# Patient Record
Sex: Male | Born: 1950 | Race: Black or African American | Hispanic: No | Marital: Married | Smoking: Former smoker
Health system: Southern US, Community
[De-identification: ages and names within clinical notes are randomized; demographics above are authoritative.]

## PROBLEM LIST (undated history)

## (undated) DIAGNOSIS — R319 Hematuria, unspecified: Secondary | ICD-10-CM

## (undated) DIAGNOSIS — Z9889 Other specified postprocedural states: Secondary | ICD-10-CM

## (undated) DIAGNOSIS — Z8669 Personal history of other diseases of the nervous system and sense organs: Secondary | ICD-10-CM

## (undated) DIAGNOSIS — I8390 Asymptomatic varicose veins of unspecified lower extremity: Secondary | ICD-10-CM

## (undated) DIAGNOSIS — E119 Type 2 diabetes mellitus without complications: Secondary | ICD-10-CM

## (undated) DIAGNOSIS — C801 Malignant (primary) neoplasm, unspecified: Secondary | ICD-10-CM

## (undated) DIAGNOSIS — R413 Other amnesia: Secondary | ICD-10-CM

## (undated) DIAGNOSIS — F1027 Alcohol dependence with alcohol-induced persisting dementia: Secondary | ICD-10-CM

## (undated) DIAGNOSIS — R269 Unspecified abnormalities of gait and mobility: Secondary | ICD-10-CM

## (undated) DIAGNOSIS — R7301 Impaired fasting glucose: Secondary | ICD-10-CM

## (undated) DIAGNOSIS — E785 Hyperlipidemia, unspecified: Secondary | ICD-10-CM

## (undated) DIAGNOSIS — N289 Disorder of kidney and ureter, unspecified: Secondary | ICD-10-CM

## (undated) DIAGNOSIS — M47812 Spondylosis without myelopathy or radiculopathy, cervical region: Secondary | ICD-10-CM

## (undated) DIAGNOSIS — I10 Essential (primary) hypertension: Secondary | ICD-10-CM

## (undated) DIAGNOSIS — H409 Unspecified glaucoma: Secondary | ICD-10-CM

## (undated) HISTORY — DX: Asymptomatic varicose veins of unspecified lower extremity: I83.90

## (undated) HISTORY — DX: Unspecified abnormalities of gait and mobility: R26.9

## (undated) HISTORY — PX: EYE SURGERY: SHX253

## (undated) HISTORY — DX: Personal history of other diseases of the nervous system and sense organs: Z86.69

## (undated) HISTORY — DX: Hyperlipidemia, unspecified: E78.5

## (undated) HISTORY — DX: Essential (primary) hypertension: I10

## (undated) HISTORY — DX: Impaired fasting glucose: R73.01

## (undated) HISTORY — DX: Type 2 diabetes mellitus without complications: E11.9

## (undated) HISTORY — DX: Other amnesia: R41.3

## (undated) HISTORY — DX: Spondylosis without myelopathy or radiculopathy, cervical region: M47.812

## (undated) HISTORY — DX: Other specified postprocedural states: Z98.890

## (undated) HISTORY — PX: PROSTATE SURGERY: SHX751

## (undated) HISTORY — DX: Unspecified glaucoma: H40.9

## (undated) HISTORY — PX: CHOLECYSTECTOMY: SHX55

---

## 2001-12-14 ENCOUNTER — Ambulatory Visit: Admission: RE | Admit: 2001-12-14 | Discharge: 2002-03-14 | Payer: Self-pay | Admitting: Radiation Oncology

## 2001-12-29 ENCOUNTER — Ambulatory Visit (HOSPITAL_COMMUNITY): Admission: RE | Admit: 2001-12-29 | Discharge: 2001-12-29 | Payer: Self-pay | Admitting: Radiation Oncology

## 2001-12-29 ENCOUNTER — Encounter: Payer: Self-pay | Admitting: Radiation Oncology

## 2003-08-12 ENCOUNTER — Emergency Department (HOSPITAL_COMMUNITY): Admission: EM | Admit: 2003-08-12 | Discharge: 2003-08-12 | Payer: Self-pay | Admitting: Emergency Medicine

## 2005-04-14 ENCOUNTER — Ambulatory Visit (HOSPITAL_COMMUNITY): Admission: RE | Admit: 2005-04-14 | Discharge: 2005-04-14 | Payer: Self-pay | Admitting: Internal Medicine

## 2005-04-14 ENCOUNTER — Ambulatory Visit: Payer: Self-pay | Admitting: Internal Medicine

## 2006-12-02 ENCOUNTER — Encounter: Admission: RE | Admit: 2006-12-02 | Discharge: 2006-12-02 | Payer: Self-pay | Admitting: Internal Medicine

## 2007-05-29 ENCOUNTER — Ambulatory Visit (HOSPITAL_COMMUNITY): Admission: RE | Admit: 2007-05-29 | Discharge: 2007-05-29 | Payer: Self-pay | Admitting: Internal Medicine

## 2008-01-10 ENCOUNTER — Emergency Department (HOSPITAL_COMMUNITY): Admission: EM | Admit: 2008-01-10 | Discharge: 2008-01-10 | Payer: Self-pay | Admitting: Emergency Medicine

## 2008-06-21 ENCOUNTER — Emergency Department (HOSPITAL_COMMUNITY): Admission: EM | Admit: 2008-06-21 | Discharge: 2008-06-21 | Payer: Self-pay | Admitting: Emergency Medicine

## 2008-08-22 ENCOUNTER — Ambulatory Visit (HOSPITAL_COMMUNITY): Admission: RE | Admit: 2008-08-22 | Discharge: 2008-08-22 | Payer: Self-pay | Admitting: Ophthalmology

## 2008-09-05 ENCOUNTER — Ambulatory Visit (HOSPITAL_COMMUNITY): Admission: RE | Admit: 2008-09-05 | Discharge: 2008-09-05 | Payer: Self-pay | Admitting: Ophthalmology

## 2011-04-16 NOTE — Op Note (Signed)
Jeffery [name], Jeffery [name]                 ACCOUNT NO.:  [id number]   MEDICAL RECORD NO.:  [id number]          PATIENT TYPE:  AMB   LOCATION:  DAY                           FACILITY:  [facility]   PHYSICIAN:  R. Roetta Sessions, M.D. DATE OF BIRTH:  Mar 18, 1951   DATE OF PROCEDURE:  04/14/2005  DATE OF DISCHARGE:                                 OPERATIVE REPORT   PROCEDURE:  Screening colonoscopy.   INDICATIONS:  The patient is a 60 year old African American male sent over  at the courtesy of Dr. Ouida Sills for colorectal cancer screening.  He is devoid  of any lower GI tract symptoms.  He has never had colonoscopy and there is  no family history of colorectal neoplasia.  The colonoscopy is now being  done as a screening maneuver.  This approach has been discussed with the  patient along with the potential risks, benefits and alternatives.  His  questions answered and he is agreeable.   DESCRIPTION OF PROCEDURE:  Oxygen saturation, blood pressure, pulse and  respiration were monitored throughout the entire procedure.  Conscious  sedation with Versed 5 mg IV and Demerol 125 mg IV in divided doses.  The  instrument was the Olympus video chip system.   FINDINGS:  Digital rectal exam revealed no abnormalities.   ENDOSCOPIC FINDINGS:  Prep was good.   Rectum:  Examination of the rectal mucosa including retroflexed view of the  anal verge revealed no abnormalities.   Colon:  Colonic mucosa was surveyed from the rectosigmoid junction through  the left, transverse, right colon and the  area of the appendiceal orifice,  ileocecal valve and cecum.  These structures were well seen and photographed  for the record.  From the level the scope was slowly withdrawn.  All  previously mentioned mucosal surfaces were again seen.  The patient had just  a couple of very small and narrow-mouthed sigmoid diverticula.  The  remainder of the colonic mucosa appeared normal.   The patient tolerated the procedure well and was  reactive after endoscopy.   IMPRESSION:  1.  Normal rectum.  2.  Minimal sigmoid diverticula; otherwise normal colon.   RECOMMENDATIONS:  1.  Diverticulosis literature provided to [name].  2.  Repeat screening colonoscopy in 10 years.      RMR/MEDQ  D:  04/14/2005  T:  04/14/2005  Job:  [id number]   cc:   Kingsley Callander. Ouida Sills, MD  [address]  [place]  [place] [address]  Fax: [phone]

## 2011-08-30 LAB — POCT I-STAT 4, (NA,K, GLUC, HGB,HCT)
Hemoglobin: 16.7
Sodium: 136

## 2011-09-01 LAB — BASIC METABOLIC PANEL
BUN: 25 — ABNORMAL HIGH
Chloride: 100
Glucose, Bld: 120 — ABNORMAL HIGH
Potassium: 3.8

## 2011-09-01 LAB — HEMOGLOBIN AND HEMATOCRIT, BLOOD: Hemoglobin: 14.7

## 2012-12-07 DIAGNOSIS — E0590 Thyrotoxicosis, unspecified without thyrotoxic crisis or storm: Secondary | ICD-10-CM | POA: Insufficient documentation

## 2012-12-07 DIAGNOSIS — R5381 Other malaise: Secondary | ICD-10-CM | POA: Insufficient documentation

## 2012-12-07 DIAGNOSIS — R209 Unspecified disturbances of skin sensation: Secondary | ICD-10-CM | POA: Insufficient documentation

## 2012-12-07 DIAGNOSIS — M47812 Spondylosis without myelopathy or radiculopathy, cervical region: Secondary | ICD-10-CM | POA: Insufficient documentation

## 2012-12-07 DIAGNOSIS — R269 Unspecified abnormalities of gait and mobility: Secondary | ICD-10-CM | POA: Insufficient documentation

## 2012-12-07 DIAGNOSIS — G3281 Cerebellar ataxia in diseases classified elsewhere: Secondary | ICD-10-CM | POA: Insufficient documentation

## 2012-12-07 DIAGNOSIS — R55 Syncope and collapse: Secondary | ICD-10-CM | POA: Insufficient documentation

## 2013-04-06 ENCOUNTER — Encounter: Payer: Self-pay | Admitting: Neurology

## 2013-04-06 DIAGNOSIS — M47812 Spondylosis without myelopathy or radiculopathy, cervical region: Secondary | ICD-10-CM

## 2013-04-06 DIAGNOSIS — R209 Unspecified disturbances of skin sensation: Secondary | ICD-10-CM

## 2013-04-06 DIAGNOSIS — R5381 Other malaise: Secondary | ICD-10-CM

## 2013-04-06 DIAGNOSIS — G3281 Cerebellar ataxia in diseases classified elsewhere: Secondary | ICD-10-CM

## 2013-04-06 DIAGNOSIS — R55 Syncope and collapse: Secondary | ICD-10-CM

## 2013-04-06 DIAGNOSIS — E0590 Thyrotoxicosis, unspecified without thyrotoxic crisis or storm: Secondary | ICD-10-CM

## 2013-04-06 DIAGNOSIS — R269 Unspecified abnormalities of gait and mobility: Secondary | ICD-10-CM

## 2013-04-09 ENCOUNTER — Ambulatory Visit: Payer: Self-pay | Admitting: Neurology

## 2014-03-05 ENCOUNTER — Emergency Department (HOSPITAL_COMMUNITY)
Admission: EM | Admit: 2014-03-05 | Discharge: 2014-03-05 | Disposition: A | Discharge status: Home / Self Care | Payer: BC Managed Care – PPO | Attending: Emergency Medicine | Admitting: Emergency Medicine

## 2014-03-05 ENCOUNTER — Encounter (HOSPITAL_COMMUNITY): Payer: Self-pay | Admitting: Emergency Medicine

## 2014-03-05 DIAGNOSIS — Z8719 Personal history of other diseases of the digestive system: Secondary | ICD-10-CM | POA: Insufficient documentation

## 2014-03-05 DIAGNOSIS — R1110 Vomiting, unspecified: Secondary | ICD-10-CM | POA: Insufficient documentation

## 2014-03-05 DIAGNOSIS — Z880 Allergy status to penicillin: Secondary | ICD-10-CM | POA: Insufficient documentation

## 2014-03-05 DIAGNOSIS — J029 Acute pharyngitis, unspecified: Secondary | ICD-10-CM | POA: Insufficient documentation

## 2014-03-05 DIAGNOSIS — Z8679 Personal history of other diseases of the circulatory system: Secondary | ICD-10-CM | POA: Insufficient documentation

## 2014-03-05 DIAGNOSIS — Z79899 Other long term (current) drug therapy: Secondary | ICD-10-CM | POA: Insufficient documentation

## 2014-03-05 DIAGNOSIS — Z8669 Personal history of other diseases of the nervous system and sense organs: Secondary | ICD-10-CM | POA: Insufficient documentation

## 2014-03-05 DIAGNOSIS — J309 Allergic rhinitis, unspecified: Secondary | ICD-10-CM

## 2014-03-05 NOTE — ED Notes (Signed)
Patient with no complaints at this time. Respirations even and unlabored. Skin warm/dry. Discharge instructions reviewed with patient at this time. Patient given opportunity to voice concerns/ask questions. Patient discharged at this time and left Emergency Department with steady gait.   

## 2014-03-05 NOTE — Discharge Instructions (Signed)
Use zyrtec OTC daily (or claritin, zyrtec is less sedating). Use nasacort allergy 24 hr OTC for your nasal congestion and sneezing. Try Netti pot bottle to clean out your nasal passages. Recheck if you get a fever, pain in your face or you aren't improving on the medications in the next week.     Hay Fever Hay fever is an allergic reaction to particles in the air. It cannot be passed from person to person. It cannot be cured, but it can be controlled. CAUSES  Hay fever is caused by something that triggers an allergic reaction (allergens). The following are examples of allergens:  Ragweed.  Feathers.  Animal dander.  Grass and tree pollens.  Cigarette smoke.  House dust.  Pollution. SYMPTOMS   Sneezing.  Runny or stuffy nose.  Tearing eyes.  Itchy eyes, nose, mouth, throat, skin, or other area.  Sore throat.  Headache.  Decreased sense of smell or taste. DIAGNOSIS Your caregiver will perform a physical exam and ask questions about the symptoms you are having.Allergy testing may be done to determine exactly what triggers your hay fever.  TREATMENT   Over-the-counter medicines may help symptoms. These include:  Antihistamines.  Decongestants. These may help with nasal congestion.  Your caregiver may prescribe medicines if over-the-counter medicines do not work.  Some people benefit from allergy shots when other medicines are not helpful. HOME CARE INSTRUCTIONS   Avoid the allergen that is causing your symptoms, if possible.  Take all medicine as told by your caregiver. SEEK MEDICAL CARE IF:   You have severe allergy symptoms and your current medicines are not helping.  Your treatment was working at one time, but you are now experiencing symptoms.  You have sinus congestion and pressure.  You develop a fever or headache.  You have thick nasal discharge.  You have asthma and have a worsening cough and wheezing. SEEK IMMEDIATE MEDICAL CARE IF:   You  have swelling of your tongue or lips.  You have trouble breathing.  You feel lightheaded or like you are going to faint.  You have cold sweats.  You have a fever. Document Released: 11/15/2005 Document Revised: 02/07/2012 Document Reviewed: 02/10/2011 [facility] Patient Information 2014 [place].

## 2014-03-05 NOTE — ED Notes (Signed)
Reports worsening sinus congestion for past couple of days making pt vomit.  Pt reports eating a grape last night and grape got stuck in throat.  Took an hour to cough grape out.  Pt reports feels like throat is irritated and still feels like something is stuck in throat.

## 2014-03-05 NOTE — ED Provider Notes (Signed)
CSN: [id number]     Arrival date & time 03/05/14  6962 History  This chart was scribed for Janice Norrie, MD by Rolanda Lundborg, ED Scribe. This patient was seen in room APA12/APA12 and the patient's care was started at 7:08 AM.    Chief Complaint  Patient presents with  . Nasal Congestion     (Consider location/radiation/quality/duration/timing/severity/associated sxs/prior Treatment) The history is provided by the patient. No language interpreter was used.   HPI Comments: Jeffery [name] is a 63 y.o. male who presents to the Emergency Department complaining of gradually-worsening, constant sinus congestion onset a few weeks ago when the weather started changing. He reports constant postnasal drip that causes him to spit and occasionally vomit. He states last night a grape got stuck in his throat for an hour and he has been having a sore throat and feeling like something is stuck there since.He was able to cough it up.  He reports a cough with clear sputum that is now yellow sputum and states he saw streaks of blood mixed in this morning.  He reports sneezing. He denies any facial pain, fever. He reports rhinorrhea that has been clear but turned yellow today. He has been taking Alka Seltzer plus. He states the springtime is normally bad for his sinuses. He denies fever or chills. He does not smoke.  PCP - [name]   Past Medical History  Diagnosis Date  . Progressive gait disorder   . Hx of arthroscopic knee surgery     Left knee  . Glaucoma   . History of migraine headaches   . Cervical spondylosis    Past Surgical History  Procedure Laterality Date  . Elbow surgery Left   . Cholecystectomy    . Prostate surgery     Family History  Problem Relation Age of Onset  . Glaucoma Mother   . Arthritis Father   . Fibromyalgia Sister   . Gait disorder Maternal Uncle     progessive gait disorder   History  Substance Use Topics  . Smoking status: Never Smoker   . Smokeless tobacco: Not  on file  . Alcohol Use: Yes     Comment: daily  employed  Review of Systems  Constitutional: Negative for fever and chills.  HENT: Positive for congestion, postnasal drip, rhinorrhea, sinus pressure, sneezing and sore throat.   Respiratory: Positive for cough.   Gastrointestinal: Positive for vomiting.  All other systems reviewed and are negative.      Allergies  Penicillins  Home Medications   Current Outpatient Rx  Name  Route  Sig  Dispense  Refill  . amLODipine-olmesartan (AZOR) 5-40 MG per tablet   Oral   Take 1 tablet by mouth daily.         . baclofen (LIORESAL) 10 MG tablet   Oral   Take 10 mg by mouth daily. One half tablet am, 1 tablet at bedtime         . Cholecalciferol (VITAMIN D) 1000 UNITS capsule   Oral   Take 1,000 Units by mouth daily.          BP 125/86  Pulse 90  Temp(Src) 98 F (36.7 C) (Oral)  Resp 16  Ht 6\' 2"  (1.88 m)  Wt 255 lb (115.667 kg)  BMI 32.73 kg/m2  SpO2 94%  Vital signs normal   Physical Exam  Nursing note and vitals reviewed. Constitutional: He is oriented to person, place, and time. He appears well-developed and well-nourished.  Non-toxic appearance. He does not appear ill. No distress.  HENT:  Head: Normocephalic and atraumatic.  Right Ear: Tympanic membrane, external ear and ear canal normal.  Left Ear: Tympanic membrane, external ear and ear canal normal.  Nose: Nose normal. No mucosal edema or rhinorrhea.  Mouth/Throat: Mucous membranes are normal. No dental abscesses or uvula swelling. Posterior oropharyngeal erythema (mild diffuse) present.  Bogginess of turbinates bilaterally, mild diffuse redness of the oral pharynx. No soft tissue swelling. Voice normal.   Eyes: Conjunctivae and EOM are normal. Pupils are equal, round, and reactive to light.  Neck: Normal range of motion and full passive range of motion without pain. Neck supple.  Cardiovascular: Normal rate, regular rhythm and normal heart sounds.  Exam  reveals no gallop and no friction rub.   No murmur heard. Pulmonary/Chest: Effort normal and breath sounds normal. No respiratory distress. He has no wheezes. He has no rhonchi. He has no rales. He exhibits no tenderness and no crepitus.  Abdominal: Soft. Normal appearance and bowel sounds are normal. He exhibits no distension. There is no tenderness. There is no rebound and no guarding.  Musculoskeletal: Normal range of motion. He exhibits no edema and no tenderness.  Moves all extremities well.   Neurological: He is alert and oriented to person, place, and time. He has normal strength. No cranial nerve deficit.  Skin: Skin is warm, dry and intact. No rash noted. No erythema. No pallor.  Psychiatric: He has a normal mood and affect. His speech is normal and behavior is normal. His mood appears not anxious.    ED Course  Procedures (including critical care time) Medications - No data to display  DIAGNOSTIC STUDIES: Oxygen Saturation is 94% on RA, adequate by my interpretation.    COORDINATION OF CARE: 7:51 AM- Discussed treatment plan with pt which includes discharge home. Pt agrees to plan.    Labs Review Labs Reviewed - No data to display Imaging Review No results found.   EKG Interpretation None      MDM  patient presents with allergy type symptoms with rhinorrhea, sneezing, cough. He has no concerning symptoms that would require antibiotics such as high fever. He will be managed with over-the-counter allergy type medications.    Final diagnoses:  Allergic rhinitis    OTC meds  Zyrtec/claritin and nasacort  Plan discharge  Rolland Porter, MD, FACEP    I personally performed the services described in this documentation, which was scribed in my presence. The recorded information has been reviewed and considered.  Rolland Porter, MD, Abram Sander   Janice Norrie, MD 03/05/14 [phone]

## 2014-04-07 ENCOUNTER — Emergency Department (HOSPITAL_COMMUNITY): Payer: BC Managed Care – PPO

## 2014-04-07 ENCOUNTER — Encounter (HOSPITAL_COMMUNITY): Payer: Self-pay | Admitting: Emergency Medicine

## 2014-04-07 ENCOUNTER — Emergency Department (HOSPITAL_COMMUNITY)
Admission: EM | Admit: 2014-04-07 | Discharge: 2014-04-07 | Disposition: A | Discharge status: Home / Self Care | Payer: BC Managed Care – PPO | Attending: Emergency Medicine | Admitting: Emergency Medicine

## 2014-04-07 DIAGNOSIS — R0982 Postnasal drip: Secondary | ICD-10-CM | POA: Insufficient documentation

## 2014-04-07 DIAGNOSIS — Z8669 Personal history of other diseases of the nervous system and sense organs: Secondary | ICD-10-CM | POA: Insufficient documentation

## 2014-04-07 DIAGNOSIS — K859 Acute pancreatitis without necrosis or infection, unspecified: Secondary | ICD-10-CM

## 2014-04-07 DIAGNOSIS — Z9089 Acquired absence of other organs: Secondary | ICD-10-CM | POA: Insufficient documentation

## 2014-04-07 DIAGNOSIS — Z880 Allergy status to penicillin: Secondary | ICD-10-CM | POA: Insufficient documentation

## 2014-04-07 DIAGNOSIS — Z8679 Personal history of other diseases of the circulatory system: Secondary | ICD-10-CM | POA: Insufficient documentation

## 2014-04-07 DIAGNOSIS — K869 Disease of pancreas, unspecified: Secondary | ICD-10-CM | POA: Insufficient documentation

## 2014-04-07 DIAGNOSIS — Z9889 Other specified postprocedural states: Secondary | ICD-10-CM | POA: Insufficient documentation

## 2014-04-07 DIAGNOSIS — M47812 Spondylosis without myelopathy or radiculopathy, cervical region: Secondary | ICD-10-CM | POA: Insufficient documentation

## 2014-04-07 DIAGNOSIS — Z79899 Other long term (current) drug therapy: Secondary | ICD-10-CM | POA: Insufficient documentation

## 2014-04-07 DIAGNOSIS — R6883 Chills (without fever): Secondary | ICD-10-CM | POA: Insufficient documentation

## 2014-04-07 DIAGNOSIS — R269 Unspecified abnormalities of gait and mobility: Secondary | ICD-10-CM | POA: Insufficient documentation

## 2014-04-07 LAB — CBC WITH DIFFERENTIAL/PLATELET
BASOS ABS: 0 10*3/uL (ref 0.0–0.1)
BASOS PCT: 0 % (ref 0–1)
EOS PCT: 0 % (ref 0–5)
Eosinophils Absolute: 0 10*3/uL (ref 0.0–0.7)
HEMATOCRIT: 44.1 % (ref 39.0–52.0)
Hemoglobin: 16.1 g/dL (ref 13.0–17.0)
LYMPHS PCT: 8 % — AB (ref 12–46)
Lymphs Abs: 0.7 10*3/uL (ref 0.7–4.0)
MCH: 37.1 pg — ABNORMAL HIGH (ref 26.0–34.0)
MCHC: 36.5 g/dL — AB (ref 30.0–36.0)
MCV: 101.6 fL — AB (ref 78.0–100.0)
MONO ABS: 0.8 10*3/uL (ref 0.1–1.0)
Monocytes Relative: 8 % (ref 3–12)
NEUTROS ABS: 7.7 10*3/uL (ref 1.7–7.7)
Neutrophils Relative %: 84 % — ABNORMAL HIGH (ref 43–77)
PLATELETS: 188 10*3/uL (ref 150–400)
RBC: 4.34 MIL/uL (ref 4.22–5.81)
RDW: 12.9 % (ref 11.5–15.5)
WBC: 9.1 10*3/uL (ref 4.0–10.5)

## 2014-04-07 LAB — COMPREHENSIVE METABOLIC PANEL
ALBUMIN: 3.8 g/dL (ref 3.5–5.2)
ALT: 109 U/L — AB (ref 0–53)
AST: 324 U/L — AB (ref 0–37)
Alkaline Phosphatase: 185 U/L — ABNORMAL HIGH (ref 39–117)
BUN: 22 mg/dL (ref 6–23)
CALCIUM: 9.2 mg/dL (ref 8.4–10.5)
CO2: 28 mEq/L (ref 19–32)
CREATININE: 1.54 mg/dL — AB (ref 0.50–1.35)
Chloride: 86 mEq/L — ABNORMAL LOW (ref 96–112)
GFR calc Af Amer: 54 mL/min — ABNORMAL LOW (ref >90)
GFR calc non Af Amer: 46 mL/min — ABNORMAL LOW (ref >90)
Glucose, Bld: 234 mg/dL — ABNORMAL HIGH (ref 70–99)
Potassium: 3 mEq/L — ABNORMAL LOW (ref 3.7–5.3)
SODIUM: 131 meq/L — AB (ref 137–147)
TOTAL PROTEIN: 7.8 g/dL (ref 6.0–8.3)
Total Bilirubin: 1.7 mg/dL — ABNORMAL HIGH (ref 0.3–1.2)

## 2014-04-07 LAB — URINALYSIS, ROUTINE W REFLEX MICROSCOPIC
GLUCOSE, UA: NEGATIVE mg/dL
Ketones, ur: NEGATIVE mg/dL
Leukocytes, UA: NEGATIVE
Nitrite: NEGATIVE
PH: 5.5 (ref 5.0–8.0)
UROBILINOGEN UA: 0.2 mg/dL (ref 0.0–1.0)

## 2014-04-07 LAB — URINE MICROSCOPIC-ADD ON

## 2014-04-07 LAB — LIPASE, BLOOD: LIPASE: 458 U/L — AB (ref 11–59)

## 2014-04-07 MED ORDER — PROMETHAZINE HCL 25 MG PO TABS: 25.0000 mg | ORAL_TABLET | Freq: Four times a day (QID) | ORAL | Status: DC | PRN

## 2014-04-07 MED ORDER — PANTOPRAZOLE SODIUM 40 MG IV SOLR
40.0000 mg | Freq: Once | INTRAVENOUS | Status: AC
  Administered 2014-04-07: 40 mg via INTRAVENOUS
  Filled 2014-04-07: qty 40

## 2014-04-07 MED ORDER — ONDANSETRON HCL 4 MG/2ML IJ SOLN
4.0000 mg | Freq: Once | INTRAMUSCULAR | Status: AC
  Administered 2014-04-07: 4 mg via INTRAVENOUS
  Filled 2014-04-07: qty 2

## 2014-04-07 MED ORDER — HYDROMORPHONE HCL PF 1 MG/ML IJ SOLN
0.5000 mg | Freq: Once | INTRAMUSCULAR | Status: AC
  Administered 2014-04-07: 0.5 mg via INTRAVENOUS
  Filled 2014-04-07: qty 1

## 2014-04-07 MED ORDER — SODIUM CHLORIDE 0.9 % IV BOLUS (SEPSIS)
1000.0000 mL | Freq: Once | INTRAVENOUS | Status: AC
  Administered 2014-04-07: 1000 mL via INTRAVENOUS

## 2014-04-07 MED ORDER — POTASSIUM CHLORIDE 10 MEQ/100ML IV SOLN: 10.0000 meq | Freq: Once | INTRAVENOUS | Status: DC

## 2014-04-07 MED ORDER — HYDROCODONE-ACETAMINOPHEN 5-325 MG PO TABS: 1.0000 | ORAL_TABLET | Freq: Four times a day (QID) | ORAL | Status: DC | PRN

## 2014-04-07 MED ORDER — POTASSIUM CHLORIDE 10 MEQ/100ML IV SOLN
10.0000 meq | Freq: Once | INTRAVENOUS | Status: AC
  Administered 2014-04-07: 10 meq via INTRAVENOUS
  Filled 2014-04-07: qty 100

## 2014-04-07 NOTE — Discharge Instructions (Signed)
Stop drinking alcohol and follow up with your family md this week.

## 2014-04-07 NOTE — ED Notes (Signed)
Pt resting.  No complaints at this time.  IV infusing without difficulty.  No distress or needs verbalized.

## 2014-04-07 NOTE — ED Notes (Signed)
Pt c/o generalized abd pain with n/v that was seen in er yesterday,

## 2014-04-07 NOTE — ED Provider Notes (Signed)
CSN: [id number]     Arrival date & time 04/07/14  1437 History  This chart was scribed for Maudry Diego, MD by Zettie Pho, ED Scribe. This patient was seen in room APA01/APA01 and the patient's care was started at 3:19 PM.    Chief Complaint  Patient presents with  . Emesis    Patient is a 63 y.o. male presenting with vomiting. The history is provided by the patient. No language interpreter was used.  Emesis Severity:  Mild Timing:  Intermittent Quality:  Stomach contents Progression:  Unchanged Chronicity:  New Relieved by:  Nothing Worsened by:  Nothing tried Ineffective treatments:  None tried Associated symptoms: abdominal pain and chills   Associated symptoms: no diarrhea, no fever and no headaches   Abdominal pain:    Location:  Generalized   Severity:  Moderate   Onset quality:  Gradual   Timing:  Intermittent   Progression:  Unchanged   Chronicity:  New Risk factors: prior abdominal surgery   Risk factors: no diabetes    HPI Comments: Jeffery [name] is a 63 y.o. male who presents to the Emergency Department complaining of a generalized pain to the abdomen with associated nausea, a few episodes of emesis, and chills onset a few days ago. He reports some loose stools, which he states is normal for him and denies any recent changes. Patient reports some associated congestion and postnasal drip for the past few weeks. He denies fever. Patient has a surgical history of cholecystectomy. Patient has an allergy to penicillins. Patient has no other pertinent medical history.   Past Medical History  Diagnosis Date  . Progressive gait disorder   . Hx of arthroscopic knee surgery     Left knee  . Glaucoma   . History of migraine headaches   . Cervical spondylosis    Past Surgical History  Procedure Laterality Date  . Cholecystectomy    . Prostate surgery    . Eye surgery     Family History  Problem Relation Age of Onset  . Glaucoma Mother   . Arthritis Father   .  Fibromyalgia Sister   . Gait disorder Maternal Uncle     progessive gait disorder   History  Substance Use Topics  . Smoking status: Never Smoker   . Smokeless tobacco: Not on file  . Alcohol Use: Yes     Comment: daily    Review of Systems  Constitutional: Positive for chills. Negative for appetite change and fatigue.  HENT: Positive for congestion and postnasal drip. Negative for ear discharge and sinus pressure.   Eyes: Negative for discharge.  Respiratory: Negative for cough.   Cardiovascular: Negative for chest pain.  Gastrointestinal: Positive for nausea, vomiting and abdominal pain. Negative for diarrhea.  Genitourinary: Negative for frequency and hematuria.  Musculoskeletal: Negative for back pain.  Skin: Negative for rash.  Neurological: Negative for seizures and headaches.  Psychiatric/Behavioral: Negative for hallucinations.      Allergies  Penicillins  Home Medications   Prior to Admission medications   Medication Sig Start Date End Date Taking? Authorizing Provider  amLODipine-benazepril (LOTREL) 10-20 MG per capsule Take 1 capsule by mouth daily.   Yes Historical Provider, MD  chlorthalidone (HYGROTON) 25 MG tablet Take 25 mg by mouth daily. To keep blood pressure less than 140/90   Yes Historical Provider, MD  simvastatin (ZOCOR) 20 MG tablet Take 20 mg by mouth at bedtime.   Yes Historical Provider, MD   Triage Vitals: BP  127/92  Pulse 108  Temp(Src) 98.3 F (36.8 C) (Oral)  Resp 20  SpO2 99%  Physical Exam  Constitutional: He is oriented to person, place, and time. He appears well-developed.  HENT:  Head: Normocephalic.  Eyes: Conjunctivae and EOM are normal. No scleral icterus.  Neck: Neck supple. No thyromegaly present.  Cardiovascular: Normal rate and regular rhythm.  Exam reveals no gallop and no friction rub.   No murmur heard. Pulmonary/Chest: No stridor. He has no wheezes. He has no rales. He exhibits no tenderness.  Abdominal: He  exhibits no distension. There is tenderness. There is no rebound.  Minor epigastric tenderness.   Musculoskeletal: Normal range of motion. He exhibits no edema.  Lymphadenopathy:    He has no cervical adenopathy.  Neurological: He is oriented to person, place, and time. He exhibits normal muscle tone. Coordination normal.  Skin: No rash noted. No erythema.  Psychiatric: He has a normal mood and affect. His behavior is normal.    ED Course  Procedures (including critical care time)  DIAGNOSTIC STUDIES: Oxygen Saturation is 99% on room air, normal by my interpretation.    COORDINATION OF CARE: 3:22 PM- Will order an x-ray of the abdomen, CMP, UA, and CBC. Ordered IV fluids, Zofran, Protonix, and Dilaudid to manage symptoms. Discussed treatment plan with patient at bedside and patient verbalized agreement.     Labs Review Labs Reviewed - No data to display  Imaging Review No results found.   EKG Interpretation None      MDM   Final diagnoses:  None    The chart was scribed for me under my direct supervision.  I personally performed the history, physical, and medical decision making and all procedures in the evaluation of this patient.Maudry Diego, MD 04/07/14 [phone]

## 2014-04-18 ENCOUNTER — Other Ambulatory Visit (HOSPITAL_COMMUNITY): Payer: Self-pay | Admitting: Internal Medicine

## 2014-04-18 DIAGNOSIS — R1011 Right upper quadrant pain: Secondary | ICD-10-CM

## 2014-04-23 ENCOUNTER — Ambulatory Visit (HOSPITAL_COMMUNITY)
Admission: RE | Admit: 2014-04-23 | Discharge: 2014-04-23 | Disposition: A | Discharge status: Home / Self Care | Payer: BC Managed Care – PPO | Source: Ambulatory Visit | Attending: Internal Medicine | Admitting: Internal Medicine

## 2014-04-23 DIAGNOSIS — R1011 Right upper quadrant pain: Secondary | ICD-10-CM | POA: Insufficient documentation

## 2014-04-23 DIAGNOSIS — K7689 Other specified diseases of liver: Secondary | ICD-10-CM | POA: Insufficient documentation

## 2014-10-21 ENCOUNTER — Emergency Department (HOSPITAL_COMMUNITY)
Admission: EM | Admit: 2014-10-21 | Discharge: 2014-10-22 | Disposition: A | Discharge status: Home / Self Care | Payer: BC Managed Care – PPO | Attending: Emergency Medicine | Admitting: Emergency Medicine

## 2014-10-21 ENCOUNTER — Encounter (HOSPITAL_COMMUNITY): Payer: Self-pay

## 2014-10-21 ENCOUNTER — Emergency Department (HOSPITAL_COMMUNITY): Payer: BC Managed Care – PPO

## 2014-10-21 DIAGNOSIS — Z8669 Personal history of other diseases of the nervous system and sense organs: Secondary | ICD-10-CM | POA: Diagnosis not present

## 2014-10-21 DIAGNOSIS — Z8739 Personal history of other diseases of the musculoskeletal system and connective tissue: Secondary | ICD-10-CM | POA: Diagnosis not present

## 2014-10-21 DIAGNOSIS — Z79899 Other long term (current) drug therapy: Secondary | ICD-10-CM | POA: Diagnosis not present

## 2014-10-21 DIAGNOSIS — R319 Hematuria, unspecified: Secondary | ICD-10-CM | POA: Diagnosis present

## 2014-10-21 DIAGNOSIS — N3001 Acute cystitis with hematuria: Secondary | ICD-10-CM

## 2014-10-21 DIAGNOSIS — Z8546 Personal history of malignant neoplasm of prostate: Secondary | ICD-10-CM | POA: Insufficient documentation

## 2014-10-21 DIAGNOSIS — Z9889 Other specified postprocedural states: Secondary | ICD-10-CM | POA: Diagnosis not present

## 2014-10-21 DIAGNOSIS — Z8679 Personal history of other diseases of the circulatory system: Secondary | ICD-10-CM | POA: Diagnosis not present

## 2014-10-21 DIAGNOSIS — Z880 Allergy status to penicillin: Secondary | ICD-10-CM | POA: Insufficient documentation

## 2014-10-21 DIAGNOSIS — Z9089 Acquired absence of other organs: Secondary | ICD-10-CM | POA: Diagnosis not present

## 2014-10-21 HISTORY — DX: Malignant (primary) neoplasm, unspecified: C80.1

## 2014-10-21 LAB — CBC WITH DIFFERENTIAL/PLATELET
BASOS PCT: 0 % (ref 0–1)
Basophils Absolute: 0 10*3/uL (ref 0.0–0.1)
EOS ABS: 0.1 10*3/uL (ref 0.0–0.7)
Eosinophils Relative: 1 % (ref 0–5)
HCT: 40.5 % (ref 39.0–52.0)
HEMOGLOBIN: 13.7 g/dL (ref 13.0–17.0)
Lymphocytes Relative: 29 % (ref 12–46)
Lymphs Abs: 1.6 10*3/uL (ref 0.7–4.0)
MCH: 31.4 pg (ref 26.0–34.0)
MCHC: 33.8 g/dL (ref 30.0–36.0)
MCV: 92.9 fL (ref 78.0–100.0)
MONO ABS: 0.6 10*3/uL (ref 0.1–1.0)
MONOS PCT: 11 % (ref 3–12)
NEUTROS PCT: 59 % (ref 43–77)
Neutro Abs: 3.3 10*3/uL (ref 1.7–7.7)
Platelets: 180 10*3/uL (ref 150–400)
RBC: 4.36 MIL/uL (ref 4.22–5.81)
RDW: 13.3 % (ref 11.5–15.5)
WBC: 5.6 10*3/uL (ref 4.0–10.5)

## 2014-10-21 LAB — URINE MICROSCOPIC-ADD ON

## 2014-10-21 LAB — URINALYSIS, ROUTINE W REFLEX MICROSCOPIC
Glucose, UA: NEGATIVE mg/dL
KETONES UR: 15 mg/dL — AB
NITRITE: POSITIVE — AB
PH: 6.5 (ref 5.0–8.0)
Specific Gravity, Urine: 1.025 (ref 1.005–1.030)
Urobilinogen, UA: 2 mg/dL — ABNORMAL HIGH (ref 0.0–1.0)

## 2014-10-21 LAB — BASIC METABOLIC PANEL
Anion gap: 13 (ref 5–15)
BUN: 20 mg/dL (ref 6–23)
CO2: 27 mEq/L (ref 19–32)
CREATININE: 1.4 mg/dL — AB (ref 0.50–1.35)
Calcium: 9.9 mg/dL (ref 8.4–10.5)
Chloride: 99 mEq/L (ref 96–112)
GFR, EST AFRICAN AMERICAN: 60 mL/min — AB (ref >90)
GFR, EST NON AFRICAN AMERICAN: 52 mL/min — AB (ref >90)
Glucose, Bld: 101 mg/dL — ABNORMAL HIGH (ref 70–99)
POTASSIUM: 3.8 meq/L (ref 3.7–5.3)
Sodium: 139 mEq/L (ref 137–147)

## 2014-10-21 NOTE — ED Provider Notes (Signed)
CSN: [id number]     Arrival date & time 10/21/14  1706 History  This chart was scribe for Veryl Speak, MD by Judithann Sauger, ED Scribe. The patient was seen in room APA18/APA18 and the patient's care was started at 11:01 PM.    Chief Complaint  Patient presents with  . Hematuria   Patient is a 63 y.o. male presenting with hematuria. The history is provided by the patient. No language interpreter was used.  Hematuria This is a new problem. The current episode started 6 to 12 hours ago. The problem has not changed since onset.Pertinent negatives include no chest pain and no abdominal pain. Nothing aggravates the symptoms. Nothing relieves the symptoms. He has tried nothing for the symptoms.   HPI Comments: Jeffery [name] is a 63 y.o. male who presents to the Emergency Department complaining of hematuria onset 6 hours ago. He explains his hematuria as a dark red urine with blood clots but since arriving at the ER, there is also mucous. He denies pain or discomfort while urinating. He denies back pain and abdominal pain. He also denies any previous kidney stones. He reports that he had prostate cancer 13 years ago and had surgery at [facility]. His Urologist is located in [facility] and he reports that he has an appointment during the first week of January. He reports having a cholecystectomy in the past.   Past Medical History  Diagnosis Date  . Progressive gait disorder   . Hx of arthroscopic knee surgery     Left knee  . Glaucoma   . History of migraine headaches   . Cervical spondylosis   . Cancer     prostate   Past Surgical History  Procedure Laterality Date  . Cholecystectomy    . Prostate surgery    . Eye surgery     Family History  Problem Relation Age of Onset  . Glaucoma Mother   . Arthritis Father   . Fibromyalgia Sister   . Gait disorder Maternal Uncle     progessive gait disorder   History  Substance Use Topics  . Smoking status: Never Smoker   . Smokeless tobacco:  Not on file  . Alcohol Use: Yes     Comment: daily    Review of Systems  Constitutional: Negative for fever and chills.  Cardiovascular: Negative for chest pain.  Gastrointestinal: Negative for abdominal pain.  Genitourinary: Positive for hematuria. Negative for dysuria.  Musculoskeletal: Negative for back pain.      Allergies  Penicillins  Home Medications   Prior to Admission medications   Medication Sig Start Date End Date Taking? Authorizing Provider  amLODipine-benazepril (LOTREL) 10-20 MG per capsule Take 1 capsule by mouth daily.   Yes Historical Provider, MD  chlorthalidone (HYGROTON) 25 MG tablet Take 25 mg by mouth daily. To keep blood pressure less than 140/90   Yes Historical Provider, MD  glimepiride (AMARYL) 2 MG tablet Take 2 mg by mouth daily with breakfast.   Yes Historical Provider, MD  indomethacin (INDOCIN) 50 MG capsule Take 50 mg by mouth daily as needed for mild pain (associated with gout flares).   Yes Historical Provider, MD  metFORMIN (GLUCOPHAGE) 1000 MG tablet Take 1,000 mg by mouth 2 (two) times daily.   Yes Historical Provider, MD  simvastatin (ZOCOR) 20 MG tablet Take 20 mg by mouth at bedtime.   Yes Historical Provider, MD  HYDROcodone-acetaminophen (NORCO/VICODIN) 5-325 MG per tablet Take 1 tablet by mouth every 6 (six) hours  as needed for moderate pain. Patient not taking: Reported on 10/21/2014 04/07/14   Maudry Diego, MD  promethazine (PHENERGAN) 25 MG tablet Take 1 tablet (25 mg total) by mouth every 6 (six) hours as needed for nausea or vomiting. Patient not taking: Reported on 10/21/2014 04/07/14   Maudry Diego, MD   BP 127/74 mmHg  Pulse 71  Temp(Src) 98 F (36.7 C)  Resp 16  Wt 236 lb 11.2 oz (107.366 kg)  SpO2 100% Physical Exam  Constitutional: He is oriented to person, place, and time. He appears well-developed and well-nourished. No distress.  HENT:  Head: Normocephalic and atraumatic.  Eyes: Conjunctivae and EOM are normal.   Neck: Neck supple. No tracheal deviation present.  Cardiovascular: Normal rate, regular rhythm and normal heart sounds.   No murmur heard. Pulmonary/Chest: Effort normal and breath sounds normal. No respiratory distress.  Abdominal: Soft. Bowel sounds are normal. He exhibits no distension. There is no tenderness.  Musculoskeletal: Normal range of motion.  Neurological: He is alert and oriented to person, place, and time.  Skin: Skin is warm and dry.  Psychiatric: He has a normal mood and affect. His behavior is normal.  Nursing note and vitals reviewed.   ED Course  Procedures (including critical care time) DIAGNOSTIC STUDIES: Oxygen Saturation is 100% on RA, normal by my interpretation.    COORDINATION OF CARE: 11:08 PM- Pt advised of plan for treatment and pt agrees.    Labs Review Labs Reviewed  URINALYSIS, ROUTINE W REFLEX MICROSCOPIC - Abnormal; Notable for the following:    Color, Urine RED (*)    APPearance HAZY (*)    Hgb urine dipstick LARGE (*)    Bilirubin Urine SMALL (*)    Ketones, ur 15 (*)    Protein, ur >300 (*)    Urobilinogen, UA 2.0 (*)    Nitrite POSITIVE (*)    Leukocytes, UA SMALL (*)    All other components within normal limits  URINE MICROSCOPIC-ADD ON - Abnormal; Notable for the following:    Bacteria, UA MANY (*)    All other components within normal limits  CBC WITH DIFFERENTIAL  BASIC METABOLIC PANEL    Imaging Review No results found.   EKG Interpretation None      MDM   Final diagnoses:  None    Patient is a 63 year old male with history of prostatectomy. He presents with complaints of small blood clots in his urine. He denies any fevers or chills. Denies any dysuria. Workup reveals findings suggestive of a urinary tract infection on the urinalysis. CT scan reveals blood clot present within the urinary bladder but no evidence for kidney stones. There is thickening of the bladder wall that is consistent with his prior radiation  treatment. I feel as though the patient is appropriate for discharge, but will recommend he see his urologist in follow-up.  I personally performed the services described in this documentation, which was scribed in my presence. The recorded information has been reviewed and is accurate.    Veryl Speak, MD 10/22/14 [phone]

## 2014-10-21 NOTE — ED Notes (Signed)
Pt c/o gross hematuria for 1 hour.

## 2014-10-21 NOTE — ED Notes (Signed)
EDP at bedside  

## 2014-10-21 NOTE — ED Notes (Signed)
Patient states that when he is urinating now it looks like long strips of mucus coming out.

## 2014-10-22 MED ORDER — CIPROFLOXACIN HCL 250 MG PO TABS
500.0000 mg | ORAL_TABLET | Freq: Once | ORAL | Status: AC
  Administered 2014-10-22: 500 mg via ORAL
  Filled 2014-10-22: qty 2

## 2014-10-22 MED ORDER — CIPROFLOXACIN HCL 500 MG PO TABS
500.0000 mg | ORAL_TABLET | Freq: Two times a day (BID) | ORAL | Status: DC
Start: 2014-10-22 — End: 2016-05-07

## 2014-10-22 NOTE — Discharge Instructions (Signed)
Cipro as prescribed.  Follow up with [facility] to discuss the findings on today's CT scan.  The contact information has been provided on this discharge summary.  Call to arrange this appointment.   Urinary Tract Infection Urinary tract infections (UTIs) can develop anywhere along your urinary tract. Your urinary tract is your body's drainage system for removing wastes and extra water. Your urinary tract includes two kidneys, two ureters, a bladder, and a urethra. Your kidneys are a pair of bean-shaped organs. Each kidney is about the size of your fist. They are located below your ribs, one on each side of your spine. CAUSES Infections are caused by microbes, which are microscopic organisms, including fungi, viruses, and bacteria. These organisms are so small that they can only be seen through a microscope. Bacteria are the microbes that most commonly cause UTIs. SYMPTOMS  Symptoms of UTIs may vary by age and gender of the patient and by the location of the infection. Symptoms in young women typically include a frequent and intense urge to urinate and a painful, burning feeling in the bladder or urethra during urination. Older women and men are more likely to be tired, shaky, and weak and have muscle aches and abdominal pain. A fever may mean the infection is in your kidneys. Other symptoms of a kidney infection include pain in your back or sides below the ribs, nausea, and vomiting. DIAGNOSIS To diagnose a UTI, your caregiver will ask you about your symptoms. Your caregiver also will ask to provide a urine sample. The urine sample will be tested for bacteria and white blood cells. White blood cells are made by your body to help fight infection. TREATMENT  Typically, UTIs can be treated with medication. Because most UTIs are caused by a bacterial infection, they usually can be treated with the use of antibiotics. The choice of antibiotic and length of treatment depend on your symptoms and the  type of bacteria causing your infection. HOME CARE INSTRUCTIONS  If you were prescribed antibiotics, take them exactly as your caregiver instructs you. Finish the medication even if you feel better after you have only taken some of the medication.  Drink enough water and fluids to keep your urine clear or pale yellow.  Avoid caffeine, tea, and carbonated beverages. They tend to irritate your bladder.  Empty your bladder often. Avoid holding urine for long periods of time.  Empty your bladder before and after sexual intercourse.  After a bowel movement, women should cleanse from front to back. Use each tissue only once. SEEK MEDICAL CARE IF:   You have back pain.  You develop a fever.  Your symptoms do not begin to resolve within 3 days. SEEK IMMEDIATE MEDICAL CARE IF:   You have severe back pain or lower abdominal pain.  You develop chills.  You have nausea or vomiting.  You have continued burning or discomfort with urination. MAKE SURE YOU:   Understand these instructions.  Will watch your condition.  Will get help right away if you are not doing well or get worse. Document Released: 08/25/2005 Document Revised: 05/16/2012 Document Reviewed: 12/24/2011 [facility] Patient Information 2015 [place], [place]. This information is not intended to replace advice given to you by your health care provider. Make sure you discuss any questions you have with your health care provider.

## 2014-11-01 DIAGNOSIS — C61 Malignant neoplasm of prostate: Secondary | ICD-10-CM | POA: Insufficient documentation

## 2015-03-27 ENCOUNTER — Telehealth: Payer: Self-pay | Admitting: Internal Medicine

## 2015-03-27 NOTE — Telephone Encounter (Signed)
TCS RECALL  °

## 2015-03-27 NOTE — Telephone Encounter (Signed)
Letter mailed to pt.  

## 2015-11-04 ENCOUNTER — Encounter: Payer: Self-pay | Admitting: Surgery

## 2015-11-04 ENCOUNTER — Other Ambulatory Visit: Payer: Self-pay | Admitting: *Deleted

## 2015-11-04 DIAGNOSIS — I8311 Varicose veins of right lower extremity with inflammation: Secondary | ICD-10-CM

## 2015-12-19 ENCOUNTER — Encounter: Payer: Self-pay | Admitting: Surgery

## 2015-12-25 ENCOUNTER — Other Ambulatory Visit (HOSPITAL_COMMUNITY): Payer: Self-pay | Admitting: Internal Medicine

## 2015-12-25 DIAGNOSIS — R7989 Other specified abnormal findings of blood chemistry: Secondary | ICD-10-CM

## 2015-12-29 ENCOUNTER — Ambulatory Visit (HOSPITAL_COMMUNITY)
Admission: RE | Admit: 2015-12-29 | Discharge: 2015-12-29 | Disposition: A | Discharge status: Home / Self Care | Payer: Medicare Other | Source: Ambulatory Visit | Attending: Surgery | Admitting: Surgery

## 2015-12-29 ENCOUNTER — Encounter: Payer: Self-pay | Admitting: Surgery

## 2015-12-29 ENCOUNTER — Ambulatory Visit (INDEPENDENT_AMBULATORY_CARE_PROVIDER_SITE_OTHER): Payer: Medicare Other | Admitting: Surgery

## 2015-12-29 VITALS — BP 124/79 | HR 73 | Temp 98.1°F | Resp 16 | Ht 74.0 in | Wt 252.0 lb

## 2015-12-29 DIAGNOSIS — I10 Essential (primary) hypertension: Secondary | ICD-10-CM | POA: Diagnosis not present

## 2015-12-29 DIAGNOSIS — I872 Venous insufficiency (chronic) (peripheral): Secondary | ICD-10-CM | POA: Diagnosis not present

## 2015-12-29 DIAGNOSIS — E785 Hyperlipidemia, unspecified: Secondary | ICD-10-CM | POA: Insufficient documentation

## 2015-12-29 DIAGNOSIS — I8311 Varicose veins of right lower extremity with inflammation: Secondary | ICD-10-CM | POA: Diagnosis not present

## 2015-12-29 NOTE — Progress Notes (Signed)
Patient name: Jeffery [name] MRN: [id number] DOB: 10/20/1951 Sex: male   Referred by: Dr. Willey Blade  Reason for referral:  Chief Complaint  Patient presents with  . New Evaluation    Ref. by Dr. Salena Saner  C/O Right Shin pain, 2-3 months.  Pain level 3    HISTORY OF PRESENT ILLNESS: This is a 65 year old gentleman who comes in today for concerns over an area in his right shin that has been present for approximately 2-3 months.  He states that this area has become more prominent.  The pain level is a 3.  He states that he has another area along the medial calf which has been present for a long time it is not painful.  He has chronic bilateral lower extremity edema.  He has worn compression stockings for many years.  He denies any ulcers.  He denies history of DVT.  The patient is on diabetes medication.  He states that there is irregularity between his liver and pancreas take this up in the result of gallbladder surgery.  He recently had some renal issues that he is being worked up for a stone.  He suffers from hypercholesterolemia which is managed with a statin.  He is a nonsmoker.  Past Medical History  Diagnosis Date  . Progressive gait disorder   . Hx of arthroscopic knee surgery     Left knee  . Glaucoma   . History of migraine headaches   . Cervical spondylosis   . Cancer [facility])     prostate  . Hyperlipidemia   . Hypertension   . Impaired fasting glucose   . Varicose veins     Past Surgical History  Procedure Laterality Date  . Cholecystectomy    . Prostate surgery    . Eye surgery      Social History   Social History  . Marital Status: Married    Spouse Name: N/A  . Number of Children: 2  . Years of Education: HS   Occupational History  . Heating and Air Conditioning    Social History Main Topics  . Smoking status: Never Smoker   . Smokeless tobacco: Never Used  . Alcohol Use: Yes     Comment: daily  . Drug Use: No  . Sexual Activity: Not on file    Other Topics Concern  . Not on file   Social History Narrative    Family History  Problem Relation Age of Onset  . Glaucoma Mother   . Arthritis Father   . Diabetes Father   . Fibromyalgia Sister   . Gait disorder Maternal Uncle     progessive gait disorder    Allergies as of 12/29/2015 - Review Complete 12/29/2015  Allergen Reaction Noted  . Penicillins Hives, Nausea And Vomiting, and Swelling 03/05/2014    Current Outpatient Prescriptions on File Prior to Visit  Medication Sig Dispense Refill  . amLODipine-benazepril (LOTREL) 10-20 MG per capsule Take 1 capsule by mouth daily.    . chlorthalidone (HYGROTON) 25 MG tablet Take 25 mg by mouth daily. To keep blood pressure less than 140/90    . glimepiride (AMARYL) 2 MG tablet Take 1 mg by mouth daily with breakfast.     . metFORMIN (GLUCOPHAGE) 1000 MG tablet Take 1,000 mg by mouth 2 (two) times daily.    . Potassium Chloride (KLOR-CON PO) Take 20 mEq by mouth daily.    . simvastatin (ZOCOR) 20 MG tablet Take 20 mg by mouth at  bedtime.    . ciprofloxacin (CIPRO) 500 MG tablet Take 1 tablet (500 mg total) by mouth 2 (two) times daily. One po bid x 7 days (Patient not taking: Reported on 12/29/2015) 14 tablet 0  . HYDROcodone-acetaminophen (NORCO/VICODIN) 5-325 MG per tablet Take 1 tablet by mouth every 6 (six) hours as needed for moderate pain. (Patient not taking: Reported on 10/21/2014) 20 tablet 0  . indomethacin (INDOCIN) 50 MG capsule Take 50 mg by mouth daily as needed for mild pain (associated with gout flares). Reported on 12/29/2015    . promethazine (PHENERGAN) 25 MG tablet Take 1 tablet (25 mg total) by mouth every 6 (six) hours as needed for nausea or vomiting. (Patient not taking: Reported on 10/21/2014) 15 tablet 0   No current facility-administered medications on file prior to visit.     REVIEW OF SYSTEMS: Cardiovascular: No chest pain, chest pressure, palpitations, orthopnea, or dyspnea on exertion. No  claudication or rest pain,  No history of DVT or phlebitis.  Positive for leg swelling Pulmonary: No productive cough, asthma or wheezing. Neurologic: No weakness, paresthesias, aphasia, or amaurosis. No dizziness. Hematologic: No bleeding problems or clotting disorders. Musculoskeletal: No joint pain or joint swelling. Gastrointestinal: No blood in stool or hematemesis Genitourinary: No dysuria or hematuria. Psychiatric:: No history of major depression. Integumentary: No rashes or ulcers. Constitutional: No fever or chills.  PHYSICAL EXAMINATION:  Filed Vitals:   12/29/15 1450  BP: 124/79  Pulse: 73  Temp: 98.1 F (36.7 C)  TempSrc: Oral  Resp: 16  Height: 6\' 2"  (1.88 m)  Weight: 252 lb (114.306 kg)  SpO2: 97%   Body mass index is 32.34 kg/(m^2). General: The patient appears their stated age.   HEENT:  No gross abnormalities Pulmonary: Respirations are non-labored Musculoskeletal: There are no major deformities.   Neurologic: No focal weakness or paresthesias are detected, Skin: There are no ulcer or rashes noted. Psychiatric: The patient has normal affect. Cardiovascular: There is a regular rate and rhythm without significant murmur appreciated.  2+ pitting edema bilaterally.  Large varices on the right medial calf and then slightly below this area anteriorly  Diagnostic Studies: I have ordered and reviewed his venous duplex.  This shows no evidence of DVT on the right.  There is reflux within the common femoral vein and the great saphenous vein on the right with maximum diameter of 1.03 at the saphenofemoral junction.    Assessment:  Chronic venous insufficiency Plan: I discussed the ultrasound findings today with the patient.  He has significant reflux in the right great saphenous vein which is the likely source of his varicosities as well as lower extremity edema on the right leg.  We discussed continued to wear 20-30 thigh-high compression stockings.  We discussed the  role of laser ablation with possible stab phlebectomy of the right leg to help control some of his symptoms.  He is going to consider this and will follow-up in 3 months for further discussions.     Eldridge Abrahams, M.D. [facility] Office: [phone] Pager:  [phone]

## 2015-12-31 ENCOUNTER — Ambulatory Visit (HOSPITAL_COMMUNITY)
Admission: RE | Admit: 2015-12-31 | Discharge: 2015-12-31 | Disposition: A | Discharge status: Home / Self Care | Payer: Medicare Other | Source: Ambulatory Visit | Attending: Internal Medicine | Admitting: Internal Medicine

## 2015-12-31 DIAGNOSIS — R7989 Other specified abnormal findings of blood chemistry: Secondary | ICD-10-CM

## 2015-12-31 DIAGNOSIS — R748 Abnormal levels of other serum enzymes: Secondary | ICD-10-CM | POA: Diagnosis not present

## 2016-01-05 ENCOUNTER — Telehealth: Payer: Self-pay | Admitting: Vascular Surgery

## 2016-01-05 NOTE — Telephone Encounter (Signed)
Jeffery [name] called because he received a letter from [name] addressed to [facility].  He wants to know why we have his address and another patient's name on the envelope.  He is not going to open it because it is addressed to another person's name.  Please call him at [phone].

## 2016-01-05 NOTE — Telephone Encounter (Signed)
Spoke with patient, he will mail the envelope back to our office so that we may sort out the situation. dpm

## 2016-01-05 NOTE — Telephone Encounter (Signed)
Error

## 2016-03-30 ENCOUNTER — Ambulatory Visit: Payer: Medicare Other | Admitting: Vascular Surgery

## 2016-05-07 ENCOUNTER — Encounter (HOSPITAL_COMMUNITY): Payer: Self-pay | Admitting: Emergency Medicine

## 2016-05-07 ENCOUNTER — Emergency Department (HOSPITAL_COMMUNITY)
Admission: EM | Admit: 2016-05-07 | Discharge: 2016-05-07 | Disposition: A | Discharge status: Home / Self Care | Payer: Medicare Other | Attending: Emergency Medicine | Admitting: Emergency Medicine

## 2016-05-07 DIAGNOSIS — I10 Essential (primary) hypertension: Secondary | ICD-10-CM | POA: Diagnosis not present

## 2016-05-07 DIAGNOSIS — T783XXA Angioneurotic edema, initial encounter: Secondary | ICD-10-CM | POA: Insufficient documentation

## 2016-05-07 DIAGNOSIS — Z87891 Personal history of nicotine dependence: Secondary | ICD-10-CM | POA: Insufficient documentation

## 2016-05-07 DIAGNOSIS — Z8546 Personal history of malignant neoplasm of prostate: Secondary | ICD-10-CM | POA: Insufficient documentation

## 2016-05-07 DIAGNOSIS — E785 Hyperlipidemia, unspecified: Secondary | ICD-10-CM | POA: Insufficient documentation

## 2016-05-07 DIAGNOSIS — Z7984 Long term (current) use of oral hypoglycemic drugs: Secondary | ICD-10-CM | POA: Insufficient documentation

## 2016-05-07 MED ORDER — PREDNISONE 20 MG PO TABS: 60.0000 mg | ORAL_TABLET | Freq: Every day | ORAL | Status: DC

## 2016-05-07 MED ORDER — DIPHENHYDRAMINE HCL 25 MG PO TABS: 25.0000 mg | ORAL_TABLET | Freq: Four times a day (QID) | ORAL | Status: DC

## 2016-05-07 MED ORDER — AMLODIPINE BESYLATE 10 MG PO TABS: 10.0000 mg | ORAL_TABLET | Freq: Every day | ORAL | Status: DC

## 2016-05-07 MED ORDER — DIPHENHYDRAMINE HCL 50 MG/ML IJ SOLN
50.0000 mg | Freq: Once | INTRAMUSCULAR | Status: AC
  Administered 2016-05-07: 50 mg via INTRAVENOUS
  Filled 2016-05-07: qty 1

## 2016-05-07 MED ORDER — FAMOTIDINE IN NACL 20-0.9 MG/50ML-% IV SOLN
20.0000 mg | Freq: Once | INTRAVENOUS | Status: AC
  Administered 2016-05-07: 20 mg via INTRAVENOUS
  Filled 2016-05-07: qty 50

## 2016-05-07 MED ORDER — METHYLPREDNISOLONE SODIUM SUCC 125 MG IJ SOLR
125.0000 mg | Freq: Once | INTRAMUSCULAR | Status: AC
  Administered 2016-05-07: 125 mg via INTRAVENOUS
  Filled 2016-05-07: qty 2

## 2016-05-07 NOTE — ED Provider Notes (Signed)
TIME SEEN: 3:10 AM  CHIEF COMPLAINT: Angioedema  HPI: Pt is a 65 y.o. Jeffery [name] with history of hypertension, hyperlipidemia who presents emergency department with swelling to his upper lip that started at 10:30 AM yesterday. States that it started mostly on the right side and has slowly moved over into the right side. No swelling of the bottom lip or tongue. No difficulty breathing, wheezing, dizziness, rash, itching. Has had similar symptoms once that he reports he saw his PCP form was given medication which made symptoms resolved. He is not sure what these medications were. Denies any new exposures including new soaps, lotions, detergents, medications, food. He is on benazepril. PCP is Dr. Willey [name].  ROS: See HPI Constitutional: no fever  Eyes: no drainage  ENT: no runny nose   Cardiovascular:  no chest pain  Resp: no SOB  GI: no vomiting GU: no dysuria Integumentary: no rash  Allergy: no hives  Musculoskeletal: no leg swelling  Neurological: no slurred speech ROS otherwise negative  PAST MEDICAL HISTORY/PAST SURGICAL HISTORY:  Past Medical History  Diagnosis Date  . Progressive gait disorder   . Hx of arthroscopic knee surgery     Left knee  . Glaucoma   . History of migraine headaches   . Cervical spondylosis   . Cancer [facility])     prostate  . Hyperlipidemia   . Hypertension   . Impaired fasting glucose   . Varicose veins     MEDICATIONS:  Prior to Admission medications   Medication Sig Start Date End Date Taking? Authorizing Provider  amLODipine-benazepril (LOTREL) 10-20 MG per capsule Take 1 capsule by mouth daily.    Historical Provider, MD  chlorthalidone (HYGROTON) 25 MG tablet Take 25 mg by mouth daily. To keep blood pressure less than 140/90    Historical Provider, MD  ciprofloxacin (CIPRO) 500 MG tablet Take 1 tablet (500 mg total) by mouth 2 (two) times daily. One po bid x 7 days Patient not taking: Reported on 12/29/2015 10/22/14   Veryl Speak, MD  glimepiride  (AMARYL) 2 MG tablet Take 1 mg by mouth daily with breakfast.     Historical Provider, MD  HYDROcodone-acetaminophen (NORCO/VICODIN) 5-325 MG per tablet Take 1 tablet by mouth every 6 (six) hours as needed for moderate pain. Patient not taking: Reported on 10/21/2014 04/07/14   Milton Ferguson, MD  indomethacin (INDOCIN) Jeffery MG capsule Take Jeffery mg by mouth daily as needed for mild pain (associated with gout flares). Reported on 12/29/2015    Historical Provider, MD  metFORMIN (GLUCOPHAGE) 1000 MG tablet Take 1,000 mg by mouth 2 (two) times daily.    Historical Provider, MD  Potassium Chloride (KLOR-CON PO) Take 20 mEq by mouth daily.    Historical Provider, MD  promethazine (PHENERGAN) 25 MG tablet Take 1 tablet (25 mg total) by mouth every 6 (six) hours as needed for nausea or vomiting. Patient not taking: Reported on 10/21/2014 04/07/14   Milton Ferguson, MD  simvastatin (ZOCOR) 20 MG tablet Take 20 mg by mouth at bedtime.    Historical Provider, MD    ALLERGIES:  Allergies  Allergen Reactions  . Penicillins Hives, Nausea And Vomiting and Swelling    Swelling of lips    SOCIAL HISTORY:  Social History  Substance Use Topics  . Smoking status: Former Research scientist (life sciences)  . Smokeless tobacco: Never Used  . Alcohol Use: Yes     Comment: occ    FAMILY HISTORY: Family History  Problem Relation Age of Onset  . Glaucoma  Mother   . Arthritis Father   . Diabetes Father   . Fibromyalgia Sister   . Gait disorder Maternal Uncle     progessive gait disorder    EXAM: BP 129/76 mmHg  Pulse 78  Temp(Src) 98.4 F (36.9 C) (Oral)  Resp 16  Ht 6\' 2"  (1.88 m)  Wt 242 lb (109.77 kg)  BMI 31.06 kg/m2  SpO2 96% CONSTITUTIONAL: Alert and oriented and responds appropriately to questions. Well-appearing; well-nourished HEAD: Normocephalic EYES: Conjunctivae clear, PERRL ENT: normal nose; no rhinorrhea; moist mucous membranes; No pharyngeal erythema or petechiae, no tonsillar hypertrophy or exudate, no uvular  deviation, no trismus or drooling, normal phonation, no stridor, no dental caries or abscess noted, no Ludwig's angina, tongue sits flat in the bottom of the mouth; no stridor, patient does have mild swelling of the upper lip, no swelling of the lower lip or swelling of the tongue NECK: Supple, no meningismus, no LAD  CARD: RRR; S1 and S2 appreciated; no murmurs, no clicks, no rubs, no gallops RESP: Normal chest excursion without splinting or tachypnea; breath sounds clear and equal bilaterally; no wheezes, no rhonchi, no rales, no hypoxia or respiratory distress, speaking full sentences ABD/GI: Normal bowel sounds; non-distended; soft, non-tender, no rebound, no guarding, no peritoneal signs BACK:  The back appears normal and is non-tender to palpation, there is no CVA tenderness EXT: Normal ROM in all joints; non-tender to palpation; no edema; normal capillary refill; no cyanosis, no calf tenderness or swelling    SKIN: Normal color for age and race; warm; no rash, no hives NEURO: Moves all extremities equally, sensation to light touch intact diffusely, cranial nerves II through XII intact PSYCH: The patient's mood and manner are appropriate. Grooming and personal hygiene are appropriate.  MEDICAL DECISION MAKING: Patient here with angioedema likely related to benazepril. Has been present for almost 24 hours and is mild in nature. Will give him IV Benadryl, Pepcid, steroids although my suspicion that this is allergic in nature is low. We'll continue to monitor patient that given it is mild in nature I do feel he will likely be able to go home unless there is a significant change in the ED. No sign of any airway compromise at all. He has normal phonation and is not hypoxic. No hives on exam. No hypotension. Have recommended he stop his benazepril. He takes accommodation medication of benazepril and amlodipine. We will give him a new prescription for just amlodipine. Have recommended close PCP  follow-up.  ED PROGRESS: 4:45 AM  Pt has had some improvement in his angioedema. No worsening of symptoms. I feel he is safe to be discharged home. We'll discharge with instructions to use Benadryl, steroid burst area have again advised him to stop his benazepril and follow-up with his PCP. Discussed return precautions. Patient and family member at bedside verbalize understanding and are comfortable with this plan.   At this time, I do not feel there is any life-threatening condition present. I have reviewed and discussed all results (EKG, imaging, lab, urine as appropriate), exam findings with patient. I have reviewed nursing notes and appropriate previous records.  I feel the patient is safe to be discharged home without further emergent workup. Discussed usual and customary return precautions. Patient and family (if present) verbalize understanding and are comfortable with this plan.  Patient will follow-up with their primary care provider. If they do not have a primary care provider, information for follow-up has been provided to them. All questions have been  answered.      [place], DO 05/07/16 [phone]

## 2016-05-07 NOTE — ED Notes (Signed)
Pt has swelling to top lip, started at 1000 yesterday after eating steak and eggs. Denies any food allergies, new products, or food.

## 2016-05-07 NOTE — ED Notes (Signed)
Pt denies any difficulties breathing or swallowing. Pt took benadryl without improvement yesterday. Speaking in full sentences without difficulty.

## 2016-05-07 NOTE — Discharge Instructions (Signed)
Please stop taking your amlodipine/benazepril also known as Lotensin. Your lip swelling is likely caused by the benazepril. We have given you a new prescription to start taking that is just amlodipine. You are safe to continue all of your other medications including your chlorthalidone.   Angioedema Angioedema is a sudden swelling of tissues, often of the skin. It can occur on the face or genitals or in the abdomen or other body parts. The swelling usually develops over a short period and gets better in 24 to 48 hours. It often begins during the night and is found when the person wakes up. The person may also get red, itchy patches of skin (hives). Angioedema can be dangerous if it involves swelling of the air passages.  Depending on the cause, episodes of angioedema may only happen once, come back in unpredictable patterns, or repeat for several years and then gradually fade away.  CAUSES  Angioedema can be caused by an allergic reaction to various triggers. It can also result from nonallergic causes, including reactions to drugs, immune system disorders, viral infections, or an abnormal gene that is passed to you from your parents (hereditary). For some people with angioedema, the cause is unknown.  Some things that can trigger angioedema include:   Foods.   Medicines, such as ACE inhibitors, ARBs, nonsteroidal anti-inflammatory agents, or estrogen.   Latex.   Animal saliva.   Insect stings.   Dyes used in X-rays.   Mild injury.   Dental work.  Surgery.  Stress.   Sudden changes in temperature.   Exercise. SIGNS AND SYMPTOMS   Swelling of the skin.  Hives. If these are present, there is also intense itching.  Redness in the affected area.   Pain in the affected area.  Swollen lips or tongue.  Breathing problems. This may happen if the air passages swell.  Wheezing. If internal organs are involved, there may be:   Nausea.   Abdominal pain.    Vomiting.   Difficulty swallowing.   Difficulty passing urine. DIAGNOSIS   Your health care provider will examine the affected area and take a medical and family history.  Various tests may be done to help determine the cause. Tests may include:  Allergy skin tests to see if the problem is an allergic reaction.   Blood tests to check for hereditary angioedema.   Tests to check for underlying diseases that could cause the condition.   A review of your medicines, including over-the-counter medicines, may be done. TREATMENT  Treatment will depend on the cause of the angioedema. Possible treatments include:   Removal of anything that triggered the condition (such as stopping certain medicines).   Medicines to treat symptoms or prevent attacks. Medicines given may include:   Antihistamines.   Epinephrine injection.   Steroids.   Hospitalization may be required for severe attacks. If the air passages are affected, it can be an emergency. Tubes may need to be placed to keep the airway open. HOME CARE INSTRUCTIONS   Take all medicines as directed by your health care provider.  If you were given medicines for emergency allergy treatment, always carry them with you.  Wear a medical bracelet as directed by your health care provider.   Avoid known triggers. SEEK MEDICAL CARE IF:   You have repeat attacks of angioedema.   Your attacks are more frequent or more severe despite preventive measures.   You have hereditary angioedema and are considering having children. It is important to discuss with  your health care provider the risks of passing the condition on to your children. SEEK IMMEDIATE MEDICAL CARE IF:   You have severe swelling of the mouth, tongue, or lips.  You have difficulty breathing.   You have difficulty swallowing.   You faint. MAKE SURE YOU:  Understand these instructions.  Will watch your condition.  Will get help right away if you  are not doing well or get worse.   This information is not intended to replace advice given to you by your health care provider. Make sure you discuss any questions you have with your health care provider.   Document Released: 01/24/2002 Document Revised: 12/06/2014 Document Reviewed: 07/09/2013 Elsevier Interactive Patient Education [employer].

## 2016-11-11 DIAGNOSIS — N529 Male erectile dysfunction, unspecified: Secondary | ICD-10-CM | POA: Insufficient documentation

## 2017-02-15 ENCOUNTER — Emergency Department (HOSPITAL_COMMUNITY): Payer: Medicare Other

## 2017-02-15 ENCOUNTER — Inpatient Hospital Stay (HOSPITAL_COMMUNITY)
Admission: EM | Admit: 2017-02-15 | Discharge: 2017-02-17 | DRG: 641 | Disposition: A | Discharge status: Home / Self Care | Payer: Medicare Other | Attending: Internal Medicine | Admitting: Internal Medicine

## 2017-02-15 ENCOUNTER — Encounter (HOSPITAL_COMMUNITY): Payer: Self-pay | Admitting: Emergency Medicine

## 2017-02-15 DIAGNOSIS — R05 Cough: Secondary | ICD-10-CM | POA: Diagnosis present

## 2017-02-15 DIAGNOSIS — R296 Repeated falls: Secondary | ICD-10-CM | POA: Diagnosis present

## 2017-02-15 DIAGNOSIS — E876 Hypokalemia: Principal | ICD-10-CM | POA: Diagnosis present

## 2017-02-15 DIAGNOSIS — Z833 Family history of diabetes mellitus: Secondary | ICD-10-CM

## 2017-02-15 DIAGNOSIS — Z91128 Patient's intentional underdosing of medication regimen for other reason: Secondary | ICD-10-CM

## 2017-02-15 DIAGNOSIS — N1831 Chronic kidney disease, stage 3a: Secondary | ICD-10-CM

## 2017-02-15 DIAGNOSIS — Z8546 Personal history of malignant neoplasm of prostate: Secondary | ICD-10-CM

## 2017-02-15 DIAGNOSIS — W06XXXA Fall from bed, initial encounter: Secondary | ICD-10-CM | POA: Diagnosis present

## 2017-02-15 DIAGNOSIS — Z91048 Other nonmedicinal substance allergy status: Secondary | ICD-10-CM

## 2017-02-15 DIAGNOSIS — Z83511 Family history of glaucoma: Secondary | ICD-10-CM

## 2017-02-15 DIAGNOSIS — H409 Unspecified glaucoma: Secondary | ICD-10-CM | POA: Diagnosis present

## 2017-02-15 DIAGNOSIS — I959 Hypotension, unspecified: Secondary | ICD-10-CM | POA: Diagnosis present

## 2017-02-15 DIAGNOSIS — N179 Acute kidney failure, unspecified: Secondary | ICD-10-CM | POA: Diagnosis present

## 2017-02-15 DIAGNOSIS — T503X6A Underdosing of electrolytic, caloric and water-balance agents, initial encounter: Secondary | ICD-10-CM | POA: Diagnosis present

## 2017-02-15 DIAGNOSIS — Z888 Allergy status to other drugs, medicaments and biological substances status: Secondary | ICD-10-CM

## 2017-02-15 DIAGNOSIS — Z7952 Long term (current) use of systemic steroids: Secondary | ICD-10-CM

## 2017-02-15 DIAGNOSIS — R7301 Impaired fasting glucose: Secondary | ICD-10-CM | POA: Diagnosis present

## 2017-02-15 DIAGNOSIS — Z87891 Personal history of nicotine dependence: Secondary | ICD-10-CM

## 2017-02-15 DIAGNOSIS — E785 Hyperlipidemia, unspecified: Secondary | ICD-10-CM | POA: Diagnosis present

## 2017-02-15 DIAGNOSIS — Z79899 Other long term (current) drug therapy: Secondary | ICD-10-CM

## 2017-02-15 DIAGNOSIS — N184 Chronic kidney disease, stage 4 (severe): Secondary | ICD-10-CM | POA: Diagnosis present

## 2017-02-15 DIAGNOSIS — Z7984 Long term (current) use of oral hypoglycemic drugs: Secondary | ICD-10-CM

## 2017-02-15 DIAGNOSIS — I129 Hypertensive chronic kidney disease with stage 1 through stage 4 chronic kidney disease, or unspecified chronic kidney disease: Secondary | ICD-10-CM | POA: Diagnosis present

## 2017-02-15 DIAGNOSIS — R7303 Prediabetes: Secondary | ICD-10-CM | POA: Diagnosis present

## 2017-02-15 DIAGNOSIS — S0081XA Abrasion of other part of head, initial encounter: Secondary | ICD-10-CM | POA: Diagnosis present

## 2017-02-15 DIAGNOSIS — R1110 Vomiting, unspecified: Secondary | ICD-10-CM | POA: Diagnosis present

## 2017-02-15 DIAGNOSIS — H8309 Labyrinthitis, unspecified ear: Secondary | ICD-10-CM | POA: Diagnosis present

## 2017-02-15 DIAGNOSIS — E871 Hypo-osmolality and hyponatremia: Secondary | ICD-10-CM

## 2017-02-15 DIAGNOSIS — M109 Gout, unspecified: Secondary | ICD-10-CM | POA: Diagnosis present

## 2017-02-15 DIAGNOSIS — Z880 Allergy status to penicillin: Secondary | ICD-10-CM

## 2017-02-15 DIAGNOSIS — R531 Weakness: Secondary | ICD-10-CM

## 2017-02-15 LAB — CBC
HCT: 43.5 % (ref 39.0–52.0)
Hemoglobin: 16.1 g/dL (ref 13.0–17.0)
MCH: 36.9 pg — ABNORMAL HIGH (ref 26.0–34.0)
MCHC: 37 g/dL — ABNORMAL HIGH (ref 30.0–36.0)
MCV: 99.8 fL (ref 78.0–100.0)
Platelets: 292 10*3/uL (ref 150–400)
RBC: 4.36 MIL/uL (ref 4.22–5.81)
RDW: 13.3 % (ref 11.5–15.5)
WBC: 6.9 10*3/uL (ref 4.0–10.5)

## 2017-02-15 LAB — COMPREHENSIVE METABOLIC PANEL
ALK PHOS: 91 U/L (ref 38–126)
ALT: 15 U/L — ABNORMAL LOW (ref 17–63)
ANION GAP: 19 — AB (ref 5–15)
AST: 22 U/L (ref 15–41)
Albumin: 3.4 g/dL — ABNORMAL LOW (ref 3.5–5.0)
BILIRUBIN TOTAL: 2.3 mg/dL — AB (ref 0.3–1.2)
BUN: 11 mg/dL (ref 6–20)
CO2: 35 mmol/L — AB (ref 22–32)
Calcium: 9.2 mg/dL (ref 8.9–10.3)
Chloride: 75 mmol/L — ABNORMAL LOW (ref 101–111)
Creatinine, Ser: 1.63 mg/dL — ABNORMAL HIGH (ref 0.61–1.24)
GFR calc Af Amer: 49 mL/min — ABNORMAL LOW (ref >60)
GFR calc non Af Amer: 42 mL/min — ABNORMAL LOW (ref >60)
GLUCOSE: 360 mg/dL — AB (ref 65–99)
Potassium: <2 mmol/L — CL (ref 3.5–5.1)
SODIUM: 129 mmol/L — AB (ref 135–145)
TOTAL PROTEIN: 7.7 g/dL (ref 6.5–8.1)

## 2017-02-15 LAB — I-STAT VENOUS BLOOD GAS, ED
Acid-Base Excess: 11 mmol/L — ABNORMAL HIGH (ref 0.0–2.0)
Bicarbonate: 34.4 mmol/L — ABNORMAL HIGH (ref 20.0–28.0)
O2 Saturation: 59 %
PCO2 VEN: 40.1 mmHg — AB (ref 44.0–60.0)
TCO2: 36 mmol/L (ref 0–100)
pH, Ven: 7.542 — ABNORMAL HIGH (ref 7.250–7.430)
pO2, Ven: 27 mmHg — CL (ref 32.0–45.0)

## 2017-02-15 LAB — APTT: APTT: 30 s (ref 24–36)

## 2017-02-15 LAB — DIFFERENTIAL
BASOS ABS: 0 10*3/uL (ref 0.0–0.1)
Basophils Relative: 0 %
Eosinophils Absolute: 0 10*3/uL (ref 0.0–0.7)
Eosinophils Relative: 0 %
LYMPHS PCT: 14 %
Lymphs Abs: 1 10*3/uL (ref 0.7–4.0)
Monocytes Absolute: 0.6 10*3/uL (ref 0.1–1.0)
Monocytes Relative: 9 %
NEUTROS ABS: 5.3 10*3/uL (ref 1.7–7.7)
NEUTROS PCT: 77 %

## 2017-02-15 LAB — I-STAT TROPONIN, ED: Troponin i, poc: 0.01 ng/mL (ref 0.00–0.08)

## 2017-02-15 LAB — PROTIME-INR
INR: 1.01
Prothrombin Time: 13.3 seconds (ref 11.4–15.2)

## 2017-02-15 LAB — BETA-HYDROXYBUTYRIC ACID: BETA-HYDROXYBUTYRIC ACID: 3.37 mmol/L — AB (ref 0.05–0.27)

## 2017-02-15 MED ORDER — POTASSIUM CHLORIDE CRYS ER 20 MEQ PO TBCR
40.0000 meq | EXTENDED_RELEASE_TABLET | Freq: Once | ORAL | Status: AC
  Administered 2017-02-15: 40 meq via ORAL
  Filled 2017-02-15: qty 2

## 2017-02-15 MED ORDER — SODIUM CHLORIDE 0.9 % IV SOLN
Freq: Once | INTRAVENOUS | Status: AC
  Administered 2017-02-15: 21:00:00 via INTRAVENOUS
  Filled 2017-02-15: qty 1000

## 2017-02-15 NOTE — ED Notes (Signed)
Awaiting potassium to come from pharmacy

## 2017-02-15 NOTE — ED Notes (Signed)
Lac on side of head was evaluated by EDP, this is superficial and no sutures are required

## 2017-02-15 NOTE — ED Notes (Signed)
Dr. Laverta [place] notified of critical Potassium level

## 2017-02-15 NOTE — ED Notes (Signed)
Report to RN on 5w.  Noted that pt has lac to right side of eye and this has not been sutured or cleaned (dried blood).  Will check with EDP

## 2017-02-15 NOTE — ED Triage Notes (Signed)
Pt sts weakness of legs and inner ear issues recently; pt sts falling x 3 days; pt with laceration to right side of head today

## 2017-02-15 NOTE — ED Notes (Signed)
Critical potassium called from lab.  Mali Gross, RN made aware

## 2017-02-15 NOTE — ED Notes (Signed)
Pt was given crackers and ginger ale. 

## 2017-02-15 NOTE — ED Provider Notes (Signed)
[facility] DEPT Provider Note   CSN: [id number] Arrival date & time: 02/15/17  1640     History   Chief Complaint Chief Complaint  Patient presents with  . Weakness  . Fall    HPI Jeffery [name] is a 66 y.o. male.  HPI  66 year old male who was recently treated for a left inner ear infection who presents from home after a fall. Patient states that he was diagnosed with an inner ear infection 5 days ago and started on "an antibiotic that starts with a D." States that the infection was diagnosed by his doctor because he's had ringing ringing in his ears, some ear discomfort, and some balance issues. States that he fell off his porch 2 days ago, and then rolled out of bed today, striking his head on the bedside table. He had some bleeding from a R temporal abrasion, prompting his wife to bring him in for evaluation.  Pt reports that since being diagnosed with his inner ear infection, his legs given out with ambulation and he feels off balance when he tries to walk. This only happens when ambulating, and he denies feeling of disequilibrium, dizziness, lightheadedness, or unbalance at rest. No vision changes, focal sensory or neuro deficits. He denies history of stroke or TIA. Denies recent back trauma or back pain. No nausea, vomiting, or fevers. Reports intermittent leg spasms, mostly effecting the left leg.   Past Medical History:  Diagnosis Date  . Cancer [facility])    prostate  . Cervical spondylosis   . Glaucoma   . History of migraine headaches   . Hx of arthroscopic knee surgery    Left knee  . Hyperlipidemia   . Hypertension   . Impaired fasting glucose   . Progressive gait disorder   . Varicose veins     Patient Active Problem List   Diagnosis Date Noted  . Hypokalemia 02/15/2017  . Syncope and collapse 12/07/2012  . Hyperthyroidism 12/07/2012  . Other malaise and fatigue 12/07/2012  . Disturbance of skin sensation 12/07/2012  . Cervical spondylosis without  myelopathy 12/07/2012  . Abnormality of gait 12/07/2012  . 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 CHOLECYSTECTOMY    . EYE SURGERY    . PROSTATE SURGERY         Home Medications    Prior to Admission medications   Medication Sig Start Date End Date Taking? Authorizing Provider  amLODipine (NORVASC) 10 MG tablet Take 1 tablet (10 mg total) by mouth daily. 05/07/16   Kristen N Ward, DO  chlorthalidone (HYGROTON) 25 MG tablet Take 25 mg by mouth daily. To keep blood pressure less than 140/90    Historical Provider, MD  diphenhydrAMINE (BENADRYL) 25 MG tablet Take 1 tablet (25 mg total) by mouth every 6 (six) hours. 05/07/16   Kristen N Ward, DO  glimepiride (AMARYL) 2 MG tablet Take 1 mg by mouth daily with breakfast.     Historical Provider, MD  indomethacin (INDOCIN) 50 MG capsule Take 50 mg by mouth daily as needed for mild pain (associated with gout flares). Reported on 12/29/2015    Historical Provider, MD  metFORMIN (GLUCOPHAGE) 1000 MG tablet Take 1,000 mg by mouth 2 (two) times daily.    Historical Provider, MD  Potassium Chloride (KLOR-CON PO) Take 20 mEq by mouth daily.    Historical Provider, MD  predniSONE (DELTASONE) 20 MG tablet Take 3 tablets (60 mg total) by  mouth daily. 05/07/16   Kristen N Ward, DO  simvastatin (ZOCOR) 20 MG tablet Take 20 mg by mouth at bedtime.    Historical Provider, MD    Family History Family History  Problem Relation Age of Onset  . Glaucoma Mother   . Arthritis Father   . Diabetes Father   . Fibromyalgia Sister   . Gait disorder Maternal Uncle     progessive gait disorder    Social History Social History  Substance Use Topics  . Smoking status: Former Research scientist (life sciences)  . Smokeless tobacco: Never Used  . Alcohol use Yes     Comment: occ     Allergies   Penicillins; Atenolol; Clonidine derivatives; and Tape   Review of Systems Review of Systems  Constitutional:  Negative for chills and fever.  HENT: Negative for ear discharge, ear pain, facial swelling, hearing loss, sore throat and trouble swallowing.        No tinnitus  Eyes: Negative for pain and visual disturbance.  Respiratory: Negative for cough and shortness of breath.   Cardiovascular: Negative for chest pain and palpitations.  Gastrointestinal: Negative for abdominal pain, diarrhea, nausea and vomiting.  Genitourinary: Negative for dysuria and hematuria.  Musculoskeletal: Negative for arthralgias, back pain, joint swelling, myalgias, neck pain and neck stiffness.  Skin: Positive for wound (R temple). Negative for color change.  Neurological: Negative for dizziness, seizures, syncope, facial asymmetry, speech difficulty, weakness, light-headedness, numbness and headaches.       Legs "give out"  Psychiatric/Behavioral: Negative for agitation, behavioral problems and confusion.     Physical Exam Updated Vital Signs BP 102/72 (BP Location: Right Arm)   Pulse 79   Temp 97.6 F (36.4 C)   Resp 18   SpO2 98%   Physical Exam  Constitutional: He is oriented to person, place, and time. He appears well-developed and well-nourished.  HENT:  Head: Normocephalic.  Right Ear: External ear normal.  Left Ear: External ear normal.  Nose: Nose normal.  Mouth/Throat: Oropharynx is clear and moist. No oropharyngeal exudate.  Bilateral TMs normal in appearance. Hemostatic superficial abrasion over the R temple.   Eyes: Conjunctivae and EOM are normal. Pupils are equal, round, and reactive to light.  Neck: Normal range of motion. Neck supple.  Cardiovascular: Normal rate, regular rhythm, normal heart sounds and intact distal pulses.  Exam reveals no gallop and no friction rub.   No murmur heard. Pulmonary/Chest: Effort normal and breath sounds normal. No respiratory distress. He has no wheezes. He has no rales.  Abdominal: Soft. Bowel sounds are normal. He exhibits no distension. There is no  tenderness. There is no guarding.  Musculoskeletal: Normal range of motion. He exhibits no edema, tenderness (No midline TTP over the C, T, or L spine) or deformity.  Neurological: He is alert and oriented to person, place, and time. He exhibits normal muscle tone.  Face symmetric, tongue midline. 5/5 strength in the proximal and distal upper and lower extremities bilaterally, with intact sensation to light touch. Normal finger to nose, heel to shin, and rapid alternating movements. Normal speech. Intermittent visible spasm to L leg.   Skin: Skin is warm and dry.  Psychiatric: He has a normal mood and affect.  Nursing note and vitals reviewed.    ED Treatments / Results  Labs (all labs ordered are listed, but only abnormal results are displayed) Labs Reviewed  CBC - Abnormal; Notable for the following:       Result Value   MCH 36.9 (*)  MCHC 37.0 (*)    All other components within normal limits  COMPREHENSIVE METABOLIC PANEL - Abnormal; Notable for the following:    Sodium 129 (*)    Potassium <2.0 (*)    Chloride 75 (*)    CO2 35 (*)    Glucose, Bld 360 (*)    Creatinine, Ser 1.63 (*)    Albumin 3.4 (*)    ALT 15 (*)    Total Bilirubin 2.3 (*)    GFR calc non Af Amer 42 (*)    GFR calc Af Amer 49 (*)    Anion gap 19 (*)    All other components within normal limits  BETA-HYDROXYBUTYRIC ACID - Abnormal; Notable for the following:    Beta-Hydroxybutyric Acid 3.37 (*)    All other components within normal limits  I-STAT VENOUS BLOOD GAS, ED - Abnormal; Notable for the following:    pH, Ven 7.542 (*)    pCO2, Ven 40.1 (*)    pO2, Ven 27.0 (*)    Bicarbonate 34.4 (*)    Acid-Base Excess 11.0 (*)    All other components within normal limits  PROTIME-INR  APTT  DIFFERENTIAL  I-STAT TROPOININ, ED  CBG MONITORING, ED  I-STAT CHEM 8, ED    EKG  EKG Interpretation  Date/Time:  Tuesday February 15 2017 17:26:15 EDT Ventricular Rate:  83 PR Interval:  166 QRS  Duration: 72 QT Interval:  438 QTC Calculation: 514 R Axis:   -17 Text Interpretation:  Normal sinus rhythm Inferior infarct , age undetermined Anterior infarct , age undetermined Prolonged QT Abnormal ECG No STEMI.  Confirmed by LONG MD, JOSHUA [phone]) on 02/15/2017 9:17:40 PM       Radiology Ct Head Wo Contrast  Result Date: 02/15/2017 CLINICAL DATA:  [place].  Hit head. EXAM: CT HEAD WITHOUT CONTRAST TECHNIQUE: Contiguous axial images were obtained from the base of the skull through the vertex without intravenous contrast. COMPARISON:  None. FINDINGS: Brain: No evidence of acute infarction, hemorrhage, hydrocephalus, extra-axial collection or mass lesion/mass effect. The ventricles are in the midline without mass effect or shift. The brainstem and cerebellum appear normal. Vascular: No hyperdense vessel or unexpected calcification. Skull: No acute skull fracture.  No bone lesions. Sinuses/Orbits: The paranasal sinuses and mastoid air cells are clear except for a mucous retention cyst or polyp in the left maxillary sinus. The globes are intact. Other: No scalp lesions or hematoma. IMPRESSION: 1. No acute intracranial findings or mass lesions. 2. No skull fracture.  The Electronically Signed   By: Marijo Sanes M.D.   On: 02/15/2017 19:04    Procedures Procedures (including critical care time)  Medications Ordered in ED Medications  potassium chloride SA (K-DUR,KLOR-CON) CR tablet 40 mEq (40 mEq Oral Given 02/15/17 1935)  sodium chloride 0.9 % 1,000 mL with potassium chloride 80 mEq infusion ( Intravenous Given 02/15/17 2036)     Initial Impression / Assessment and Plan / ED Course  I have reviewed the triage vital signs and the nursing notes.  Pertinent labs & imaging results that were available during my care of the patient were reviewed by me and considered in my medical decision making (see chart for details).    Generally well-appearing. Afebrile and hemodynamically stable. Neuro exam  is unremarkable as above, and I doubt central cause including CVA or TIAs cause of the patient's "lack of balance" when he walks. Patient denies dizziness or lightheadedness. CT head ordered in triage with no acute intracranial abnormalities.  Patient does  not have leg weakness on exam or midline back tenderness. Doubt cauda equina or spinal cord impingement as the cause of his symptoms.  Labs revealed profound hypokalemia, with potassium less than 2. Patient takes chlorthalidone and is supposed to be taking potassium supplementation, but states that he's not been taking it "because I didn't think it was important." His hypokalemia would explain his intermittent muscle spasms as well as the feeling of weakness in his legs.  Labs additionally remarkable for glucose of 360, pseudohyponatremia with sodium of 129, anion gap to 19. Beta hydroxybutyrate is elevated but the patient is not acidotic and I doubt DKA. Creatinine slightly worse than recent baseline.  Oral and IV potassium supplementation initiated. Patient is admitted to the hospitalist service for further management.  Care of pt overseen by my attending, Dr. Laverta Baltimore.   Final Clinical Impressions(s) / ED Diagnoses   Final diagnoses:  None    New Prescriptions Current Discharge Medication List       Zipporah Plants, MD 02/16/17 Murdock, MD 02/16/17 1053

## 2017-02-15 NOTE — ED Notes (Signed)
Blood clots noted in [facility] after pt voids.  Pt states that this is his usual.  Has to strain and passes clots when voiding

## 2017-02-15 NOTE — ED Notes (Signed)
Chem 8 results K <2 and GLU 385.  Results not crossing over due to all values not showing up on results.  Advised nurse Cortney to order another Chem 8 to be drawn or CMP.  Nurse 1st notified of K results and patient moved to room from waiting.

## 2017-02-15 NOTE — ED Notes (Signed)
Message sent to pharmacy to notify for K drip

## 2017-02-16 DIAGNOSIS — I129 Hypertensive chronic kidney disease with stage 1 through stage 4 chronic kidney disease, or unspecified chronic kidney disease: Secondary | ICD-10-CM | POA: Diagnosis present

## 2017-02-16 DIAGNOSIS — N184 Chronic kidney disease, stage 4 (severe): Secondary | ICD-10-CM | POA: Diagnosis present

## 2017-02-16 DIAGNOSIS — W06XXXA Fall from bed, initial encounter: Secondary | ICD-10-CM | POA: Diagnosis present

## 2017-02-16 DIAGNOSIS — I959 Hypotension, unspecified: Secondary | ICD-10-CM | POA: Diagnosis present

## 2017-02-16 DIAGNOSIS — N179 Acute kidney failure, unspecified: Secondary | ICD-10-CM | POA: Diagnosis present

## 2017-02-16 DIAGNOSIS — M109 Gout, unspecified: Secondary | ICD-10-CM | POA: Diagnosis present

## 2017-02-16 DIAGNOSIS — Z7952 Long term (current) use of systemic steroids: Secondary | ICD-10-CM | POA: Diagnosis not present

## 2017-02-16 DIAGNOSIS — Z79899 Other long term (current) drug therapy: Secondary | ICD-10-CM | POA: Diagnosis not present

## 2017-02-16 DIAGNOSIS — R7303 Prediabetes: Secondary | ICD-10-CM | POA: Diagnosis present

## 2017-02-16 DIAGNOSIS — Z87891 Personal history of nicotine dependence: Secondary | ICD-10-CM | POA: Diagnosis not present

## 2017-02-16 DIAGNOSIS — T503X6A Underdosing of electrolytic, caloric and water-balance agents, initial encounter: Secondary | ICD-10-CM | POA: Diagnosis present

## 2017-02-16 DIAGNOSIS — R1110 Vomiting, unspecified: Secondary | ICD-10-CM | POA: Diagnosis present

## 2017-02-16 DIAGNOSIS — E871 Hypo-osmolality and hyponatremia: Secondary | ICD-10-CM | POA: Diagnosis present

## 2017-02-16 DIAGNOSIS — Z7984 Long term (current) use of oral hypoglycemic drugs: Secondary | ICD-10-CM | POA: Diagnosis not present

## 2017-02-16 DIAGNOSIS — S0081XA Abrasion of other part of head, initial encounter: Secondary | ICD-10-CM | POA: Diagnosis present

## 2017-02-16 DIAGNOSIS — E876 Hypokalemia: Principal | ICD-10-CM

## 2017-02-16 DIAGNOSIS — Z8546 Personal history of malignant neoplasm of prostate: Secondary | ICD-10-CM | POA: Diagnosis not present

## 2017-02-16 DIAGNOSIS — H8309 Labyrinthitis, unspecified ear: Secondary | ICD-10-CM | POA: Diagnosis present

## 2017-02-16 DIAGNOSIS — Z880 Allergy status to penicillin: Secondary | ICD-10-CM | POA: Diagnosis not present

## 2017-02-16 DIAGNOSIS — Z91128 Patient's intentional underdosing of medication regimen for other reason: Secondary | ICD-10-CM | POA: Diagnosis not present

## 2017-02-16 DIAGNOSIS — N1831 Chronic kidney disease, stage 3a: Secondary | ICD-10-CM

## 2017-02-16 DIAGNOSIS — Z91048 Other nonmedicinal substance allergy status: Secondary | ICD-10-CM | POA: Diagnosis not present

## 2017-02-16 DIAGNOSIS — R05 Cough: Secondary | ICD-10-CM | POA: Diagnosis present

## 2017-02-16 DIAGNOSIS — E785 Hyperlipidemia, unspecified: Secondary | ICD-10-CM | POA: Diagnosis present

## 2017-02-16 DIAGNOSIS — Z888 Allergy status to other drugs, medicaments and biological substances status: Secondary | ICD-10-CM | POA: Diagnosis not present

## 2017-02-16 DIAGNOSIS — H409 Unspecified glaucoma: Secondary | ICD-10-CM | POA: Diagnosis present

## 2017-02-16 LAB — CBC
HCT: 38 % — ABNORMAL LOW (ref 39.0–52.0)
HEMOGLOBIN: 13.8 g/dL (ref 13.0–17.0)
MCH: 36.2 pg — AB (ref 26.0–34.0)
MCHC: 36.3 g/dL — ABNORMAL HIGH (ref 30.0–36.0)
MCV: 99.7 fL (ref 78.0–100.0)
PLATELETS: 253 10*3/uL (ref 150–400)
RBC: 3.81 MIL/uL — ABNORMAL LOW (ref 4.22–5.81)
RDW: 13 % (ref 11.5–15.5)
WBC: 5.9 10*3/uL (ref 4.0–10.5)

## 2017-02-16 LAB — BASIC METABOLIC PANEL
Anion gap: 12 (ref 5–15)
BUN: 10 mg/dL (ref 6–20)
CO2: 34 mmol/L — ABNORMAL HIGH (ref 22–32)
Calcium: 8.5 mg/dL — ABNORMAL LOW (ref 8.9–10.3)
Chloride: 86 mmol/L — ABNORMAL LOW (ref 101–111)
Creatinine, Ser: 1.37 mg/dL — ABNORMAL HIGH (ref 0.61–1.24)
GFR calc Af Amer: >60 mL/min (ref >60)
GFR, EST NON AFRICAN AMERICAN: 52 mL/min — AB (ref >60)
GLUCOSE: 158 mg/dL — AB (ref 65–99)
POTASSIUM: 2.4 mmol/L — AB (ref 3.5–5.1)
Sodium: 132 mmol/L — ABNORMAL LOW (ref 135–145)

## 2017-02-16 LAB — GLUCOSE, CAPILLARY
GLUCOSE-CAPILLARY: 225 mg/dL — AB (ref 65–99)
GLUCOSE-CAPILLARY: 230 mg/dL — AB (ref 65–99)
Glucose-Capillary: 356 mg/dL — ABNORMAL HIGH (ref 65–99)
Glucose-Capillary: 189 mg/dL — ABNORMAL HIGH (ref 65–99)
Glucose-Capillary: 177 mg/dL — ABNORMAL HIGH (ref 65–99)
Glucose-Capillary: 211 mg/dL — ABNORMAL HIGH (ref 65–99)

## 2017-02-16 LAB — POTASSIUM: POTASSIUM: 2.8 mmol/L — AB (ref 3.5–5.1)

## 2017-02-16 LAB — MAGNESIUM: Magnesium: 2.6 mg/dL — ABNORMAL HIGH (ref 1.7–2.4)

## 2017-02-16 MED ORDER — SIMVASTATIN 20 MG PO TABS
20.0000 mg | ORAL_TABLET | Freq: Every day | ORAL | Status: DC
  Administered 2017-02-16 (×2): 20 mg via ORAL
  Filled 2017-02-16 (×2): qty 1

## 2017-02-16 MED ORDER — PRO-STAT SUGAR FREE PO LIQD
30.0000 mL | Freq: Two times a day (BID) | ORAL | Status: DC
  Administered 2017-02-16 – 2017-02-17 (×2): 30 mL via ORAL
  Filled 2017-02-16 (×2): qty 30

## 2017-02-16 MED ORDER — ADULT MULTIVITAMIN W/MINERALS CH
1.0000 | ORAL_TABLET | Freq: Every day | ORAL | Status: DC
  Administered 2017-02-16 – 2017-02-17 (×2): 1 via ORAL
  Filled 2017-02-16 (×2): qty 1

## 2017-02-16 MED ORDER — ONDANSETRON HCL 4 MG PO TABS
4.0000 mg | ORAL_TABLET | Freq: Four times a day (QID) | ORAL | Status: DC | PRN
Start: 2017-02-16 — End: 2017-02-16

## 2017-02-16 MED ORDER — POTASSIUM CHLORIDE IN NACL 40-0.9 MEQ/L-% IV SOLN
INTRAVENOUS | Status: DC
Start: 2017-02-16 — End: 2017-02-16
  Administered 2017-02-16: 75 mL/h via INTRAVENOUS
  Filled 2017-02-16: qty 1000

## 2017-02-16 MED ORDER — ACETAMINOPHEN 325 MG PO TABS: 650.0000 mg | ORAL_TABLET | Freq: Four times a day (QID) | ORAL | Status: DC | PRN

## 2017-02-16 MED ORDER — ACETAMINOPHEN 650 MG RE SUPP: 650.0000 mg | Freq: Four times a day (QID) | RECTAL | Status: DC | PRN

## 2017-02-16 MED ORDER — POTASSIUM CHLORIDE 2 MEQ/ML IV SOLN
INTRAVENOUS | Status: DC
  Administered 2017-02-16: 08:00:00 via INTRAVENOUS
  Filled 2017-02-16 (×2): qty 1000

## 2017-02-16 MED ORDER — POTASSIUM CHLORIDE 2 MEQ/ML IV SOLN
INTRAVENOUS | Status: DC
  Administered 2017-02-16 – 2017-02-17 (×2): via INTRAVENOUS
  Filled 2017-02-16 (×4): qty 1000

## 2017-02-16 MED ORDER — AMLODIPINE BESYLATE 10 MG PO TABS
10.0000 mg | ORAL_TABLET | Freq: Every day | ORAL | Status: DC
  Administered 2017-02-16 – 2017-02-17 (×2): 10 mg via ORAL
  Filled 2017-02-16 (×2): qty 1

## 2017-02-16 MED ORDER — INSULIN ASPART 100 UNIT/ML ~~LOC~~ SOLN
0.0000 [IU] | Freq: Three times a day (TID) | SUBCUTANEOUS | Status: DC
  Administered 2017-02-16 (×2): 4 [IU] via SUBCUTANEOUS
  Administered 2017-02-16: 7 [IU] via SUBCUTANEOUS
  Administered 2017-02-17: 3 [IU] via SUBCUTANEOUS
  Administered 2017-02-17: 11 [IU] via SUBCUTANEOUS

## 2017-02-16 MED ORDER — ONDANSETRON HCL 4 MG/2ML IJ SOLN: 4.0000 mg | Freq: Four times a day (QID) | INTRAMUSCULAR | Status: DC | PRN

## 2017-02-16 MED ORDER — POTASSIUM CHLORIDE CRYS ER 20 MEQ PO TBCR
40.0000 meq | EXTENDED_RELEASE_TABLET | Freq: Once | ORAL | Status: AC
  Administered 2017-02-16: 40 meq via ORAL
  Filled 2017-02-16: qty 2

## 2017-02-16 MED ORDER — HEPARIN SODIUM (PORCINE) 5000 UNIT/ML IJ SOLN
5000.0000 [IU] | Freq: Three times a day (TID) | INTRAMUSCULAR | Status: DC
  Administered 2017-02-16 – 2017-02-17 (×4): 5000 [IU] via SUBCUTANEOUS
  Filled 2017-02-16 (×4): qty 1

## 2017-02-16 MED ORDER — INSULIN ASPART 100 UNIT/ML ~~LOC~~ SOLN
0.0000 [IU] | Freq: Every day | SUBCUTANEOUS | Status: DC
  Administered 2017-02-16: 5 [IU] via SUBCUTANEOUS
  Administered 2017-02-16: 2 [IU] via SUBCUTANEOUS

## 2017-02-16 MED ORDER — SODIUM CHLORIDE 0.9% FLUSH
3.0000 mL | Freq: Two times a day (BID) | INTRAVENOUS | Status: DC
  Administered 2017-02-16 – 2017-02-17 (×3): 3 mL via INTRAVENOUS

## 2017-02-16 MED ORDER — PREDNISONE 50 MG PO TABS: 60.0000 mg | ORAL_TABLET | Freq: Every day | ORAL | Status: DC

## 2017-02-16 MED ORDER — MAGNESIUM SULFATE 2 GM/50ML IV SOLN
2.0000 g | Freq: Once | INTRAVENOUS | Status: AC
  Administered 2017-02-16: 2 g via INTRAVENOUS
  Filled 2017-02-16: qty 50

## 2017-02-16 NOTE — Progress Notes (Signed)
CRITICAL VALUE ALERT  Critical value received:  Potassium 2.4  Date of notification:  02/16/17  Time of notification:  1138  Critical value read back:Yes.    Nurse who received alert:  Rolland Porter  MD notified (1st page):  MD Candiss Norse  Time of first page:  1139  MD notified (2nd page):  Time of second page:  Responding MD:  MD Candiss Norse  Time MD responded:    MD put in orders.

## 2017-02-16 NOTE — ED Notes (Signed)
Cleaned dried blood off pt

## 2017-02-16 NOTE — Care Management Obs Status (Signed)
[facility] NOTIFICATION   Patient Details  Name: Jeffery [name] MRN: [id number] Date of Birth: [birth year]   Medicare Observation Status Notification Given:  Yes    Sharin Mons, RN 02/16/2017, 7:48 AM

## 2017-02-16 NOTE — H&P (Signed)
History and Physical    KYROLLOS CORDELL NLZ:[id number] DOB: 07-Jul-1951 DOA: 02/15/2017  PCP: [name], MD   Patient coming from: Home  Chief Complaint: Muscle weakness, cramps, twitching, falls  HPI: Jeffery [name] is a 66 y.o. gentleman with a history of prostate cancer, HTN, HLD, and gout who presents to the ED for evaluation of muscle weakness and cramps, particularly in his legs.  He has had recurrent falls related to these symptoms.  He has even had muscle "twitching" in his hands.  No LOC.  No focal injury.  No change in urinary or bowel patterns.  No diarrhea.  Appetite normal.  Intermittent post-tussive emesis which he relates to cough, congestion, and phlegm.  ED Course: Patient found to have multiple electrolyte derangements including a markedly low potassium level (<2).  He also has low sodium and chloride levels.  Creatinine is up slightly from baseline of about 1.4.  Of note, he is on chlorthalidone for BP control.  He has received 57mEq of KCl po and 51mEq of KCl in NS is hanging.  Hospitalist asked to place in observation status for potassium correction.  Review of Systems: Limited ROS negative except as stated in the HPI.   Past Medical History:  Diagnosis Date  . Cancer [facility])    prostate  . Cervical spondylosis   . Glaucoma   . History of migraine headaches   . Hx of arthroscopic knee surgery    Left knee  . Hyperlipidemia   . Hypertension   . Impaired fasting glucose   . Progressive gait disorder   . Varicose veins     Past Surgical History:  Procedure Laterality Date  . CHOLECYSTECTOMY    . EYE SURGERY    . PROSTATE SURGERY       reports that he has quit smoking. He has never used smokeless tobacco. He reports that he drinks alcohol. He reports that he does not use drugs.  Allergies  Allergen Reactions  . Penicillins Hives, Nausea And Vomiting and Swelling    Swelling of lips Has patient had a PCN reaction causing immediate rash,  facial/tongue/throat swelling, SOB or lightheadedness with hypotension: Yes Has patient had a PCN reaction causing severe rash involving mucus membranes or skin necrosis: No Has patient had a PCN reaction that required hospitalization No Has patient had a PCN reaction occurring within the last 10 years: No If all of the above answers are "NO", then may proceed with Cephalosporin use.   . Atenolol Swelling and Other (See Comments)    Angioedema   . Clonidine Derivatives Swelling and Other (See Comments)    Angioedema   . Tape Rash    No plastic, "patterned" tape!!    Family History  Problem Relation Age of Onset  . Glaucoma Mother   . Arthritis Father   . Diabetes Father   . Fibromyalgia Sister   . Gait disorder Maternal Uncle     progessive gait disorder     Prior to Admission medications   Medication Sig Start Date End Date Taking? Authorizing Provider  amLODipine (NORVASC) 10 MG tablet Take 1 tablet (10 mg total) by mouth daily. 05/07/16   Kristen N Ward, DO  chlorthalidone (HYGROTON) 25 MG tablet Take 25 mg by mouth daily. To keep blood pressure less than 140/90    Historical Provider, MD  diphenhydrAMINE (BENADRYL) 25 MG tablet Take 1 tablet (25 mg total) by mouth every 6 (six) hours. 05/07/16   Kristen N Ward, DO  glimepiride (  AMARYL) 2 MG tablet Take 1 mg by mouth daily with breakfast.     Historical Provider, MD  indomethacin (INDOCIN) 50 MG capsule Take 50 mg by mouth daily as needed for mild pain (associated with gout flares). Reported on 12/29/2015    Historical Provider, MD  metFORMIN (GLUCOPHAGE) 1000 MG tablet Take 1,000 mg by mouth 2 (two) times daily.    Historical Provider, MD  Potassium Chloride (KLOR-CON PO) Take 20 mEq by mouth daily.    Historical Provider, MD  predniSONE (DELTASONE) 20 MG tablet Take 3 tablets (60 mg total) by mouth daily. 05/07/16   Kristen N Ward, DO  simvastatin (ZOCOR) 20 MG tablet Take 20 mg by mouth at bedtime.    Historical Provider, MD     Physical Exam: Vitals:   02/15/17 2115 02/15/17 2200 02/15/17 2300 02/16/17 0028  BP: 107/78 106/71 103/63 102/72  Pulse: 68 69 77 79  Resp: 13 15 14 18   Temp:    97.6 F (36.4 C)  TempSrc:      SpO2: 97% 98% 97% 98%      Constitutional: NAD, calm, comfortable, NONtoxic appearing Vitals:   02/15/17 2115 02/15/17 2200 02/15/17 2300 02/16/17 0028  BP: 107/78 106/71 103/63 102/72  Pulse: 68 69 77 79  Resp: 13 15 14 18   Temp:    97.6 F (36.4 C)  TempSrc:      SpO2: 97% 98% 97% 98%   Neck: normal appearance, supple, no masses Respiratory: clear to auscultation bilaterally, no wheezing, no crackles. Normal respiratory effort. No accessory muscle use.  Cardiovascular: Normal rate, regular rhythm, no murmurs / rubs / gallops. No extremity edema. 2+ pedal pulses.  GI: abdomen is soft and compressible.  No distention.  No tenderness.  No masses palpated.  Bowel sounds are present. Musculoskeletal:  No joint deformity in upper and lower extremities. Good ROM, no contractures. Normal muscle tone.  Skin: no rashes, warm and dry Neurologic: No apparent focal deficits Psychiatric: Normal judgment and insight. Alert and oriented x 3. Normal mood.     Labs on Admission: I have personally reviewed following labs and imaging studies  CBC:  Recent Labs Lab 02/15/17 1747  WBC 6.9  NEUTROABS 5.3  HGB 16.1  HCT 43.5  MCV 99.8  PLT 092   Basic Metabolic Panel:  Recent Labs Lab 02/15/17 1747  NA 129*  K <2.0*  CL 75*  CO2 35*  GLUCOSE 360*  BUN 11  CREATININE 1.63*  CALCIUM 9.2   GFR: CrCl cannot be calculated (Unknown ideal weight.). Liver Function Tests:  Recent Labs Lab 02/15/17 1747  AST 22  ALT 15*  ALKPHOS 91  BILITOT 2.3*  PROT 7.7  ALBUMIN 3.4*   Coagulation Profile:  Recent Labs Lab 02/15/17 1747  INR 1.01     Radiological Exams on Admission: Ct Head Wo Contrast  Result Date: 02/15/2017 CLINICAL DATA:  [place].  Hit head. EXAM: CT HEAD  WITHOUT CONTRAST TECHNIQUE: Contiguous axial images were obtained from the base of the skull through the vertex without intravenous contrast. COMPARISON:  None. FINDINGS: Brain: No evidence of acute infarction, hemorrhage, hydrocephalus, extra-axial collection or mass lesion/mass effect. The ventricles are in the midline without mass effect or shift. The brainstem and cerebellum appear normal. Vascular: No hyperdense vessel or unexpected calcification. Skull: No acute skull fracture.  No bone lesions. Sinuses/Orbits: The paranasal sinuses and mastoid air cells are clear except for a mucous retention cyst or polyp in the left maxillary sinus. The globes  are intact. Other: No scalp lesions or hematoma. IMPRESSION: 1. No acute intracranial findings or mass lesions. 2. No skull fracture.  The Electronically Signed   By: Marijo Sanes M.D.   On: 02/15/2017 19:04    EKG: Independently reviewed by me.  NSR but QT interval is prolonged.  Assessment/Plan Principal Problem:   Hypokalemia Active Problems:   AKI (acute kidney injury) (HCC)   Hyponatremia      Symptomatic hypokalemia, likely driven primarily by chlorthalidone use and failure to comply with recommended potassium supplement. --HOLD chlorthalidone; consider alternative agent, as needed, for BP control --Potassium replacement. Will give another dose of KCl 98mEq po one time now.  When initial infusion finishes, will run maintenance fluids with KCl at 75cc/hr.  Will give empiric magnesium 2 g IV one time now.  BMP and mag level at 5AM.  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  Norvasc ordered for AM; chlorthalidone on hold.  DM --Hold oral agents.  --No basal insulin for ow --Aggressive SSI AC/HS  HLD --Statin  Gout --Intermittent use of NSAIDs, prednisone   DVT prophylaxis: Low risk, outpatient status Code  Status: FULL Family Communication: Patient alone at time of admission. Disposition Plan: Expect he will go home later today. Consults called: NONE Admission status: Place in observation, telemetry monitoring   TIME SPENT: 50 minutes   Eber Jones MD [facility] Pager [phone]  If 7PM-7AM, please contact night-coverage [email or web address] Password [email or web address]  02/16/2017, 1:41 AM

## 2017-02-16 NOTE — Progress Notes (Signed)
PROGRESS NOTE                                                                                                                                                                                                             Patient Demographics:    Jeffery [name], is a 66 y.o. male, DOB - 12-02-1950, QIH:[id number]  Admit date - 02/15/2017   Admitting Physician Lily Kocher, MD  Outpatient Primary MD for the patient is [name], MD  LOS - 0  Chief Complaint  Patient presents with  . Weakness  . Fall       Brief Narrative  NIMAI Jeffery [name] is a 66 y.o. gentleman with a history of prostate cancer, HTN, EKG stage IV, HLD, and gout who presents to the ED for evaluation of muscle weakness and cramps, particularly in his legs, workup suggested dehydration with severe hypokalemia.   Subjective:    [name] today has, No headache, No chest pain, No abdominal pain - No Nausea, No new weakness tingling or numbness, No Cough - SOB.     Assessment  & Plan :     1.Severe hypokalemia due to patient taking HCTZ and not taking his potassium supplement. Simply being replaced aggressively IV and oral, will continue to monitor along with magnesium levels, hold HCTZ. Patient counseled on compliance with potassium supplement at home.  2. CK D stage IV. Creatinine close to his baseline of around 1.5-1.6.  3. HTN. Continue Norvasc.  4. Dyslipidemia. On statin.   5. DM2 - continue sliding scale, ? compliance.  CBG (last 3)   Recent Labs  02/16/17 0212 02/16/17 0753  GLUCAP 356* 189*   No results found for: HGBA1C    Diet : Diet heart healthy/carb modified Room service appropriate? Yes; Fluid consistency: Thin    Family Communication  :  None  Code Status :  Full  Disposition Plan  :  TBD  Consults  :  None  Procedures  :   CT Head - Nonacute  DVT Prophylaxis  :  Heparin   Lab Results  Component Value Date   PLT 292  02/15/2017    Inpatient Medications  Scheduled Meds: . amLODipine  10 mg Oral Daily  . insulin aspart  0-20 Units Subcutaneous TID WC  . insulin aspart  0-5 Units Subcutaneous QHS  . potassium  chloride  40 mEq Oral Once  . simvastatin  20 mg Oral QHS  . sodium chloride flush  3 mL Intravenous Q12H   Continuous Infusions: . dextrose 5 %-0.9% nacl with kcl 100 mL/hr at 02/16/17 0754   PRN Meds:.[DISCONTINUED] ondansetron **OR** ondansetron (ZOFRAN) IV  Antibiotics  :    Anti-infectives    None         Objective:   Vitals:   02/15/17 2300 02/16/17 0028 02/16/17 0602 02/16/17 1030  BP: 103/63 102/72 107/68 109/77  Pulse: 77 79 63   Resp: 14 18 18    Temp:  97.6 F (36.4 C) 97.9 F (36.6 C)   TempSrc:      SpO2: 97% 98% 98%   Weight:  102 kg (224 lb 13.9 oz)    Height:  6\' 2"  (1.88 m)      Wt Readings from Last 3 Encounters:  02/16/17 102 kg (224 lb 13.9 oz)  05/07/16 109.8 kg (242 lb)  12/29/15 114.3 kg (252 lb)     Intake/Output Summary (Last 24 hours) at 02/16/17 1101 Last data filed at 02/16/17 0603  Gross per 24 hour  Intake           183.75 ml  Output              300 ml  Net          -116.25 ml     Physical Exam  Awake Alert, Oriented X 3, No new F.N deficits, Normal affect [place].AT,PERRAL Supple Neck,No JVD, No cervical lymphadenopathy appriciated.  Symmetrical Chest wall movement, Good air movement bilaterally, CTAB RRR,No Gallops,Rubs or new Murmurs, No Parasternal Heave +ve B.Sounds, Abd Soft, No tenderness, No organomegaly appriciated, No rebound - guarding or rigidity. No Cyanosis, Clubbing or edema, No new Rash or bruise       Data Review:    CBC  Recent Labs Lab 02/15/17 1747  WBC 6.9  HGB 16.1  HCT 43.5  PLT 292  MCV 99.8  MCH 36.9*  MCHC 37.0*  RDW 13.3  LYMPHSABS 1.0  MONOABS 0.6  EOSABS 0.0  BASOSABS 0.0    Chemistries   Recent Labs Lab 02/15/17 1747  NA 129*  K <2.0*  CL 75*  CO2 35*  GLUCOSE 360*  BUN 11    CREATININE 1.63*  CALCIUM 9.2  AST 22  ALT 15*  ALKPHOS 91  BILITOT 2.3*   ------------------------------------------------------------------------------------------------------------------ No results for input(s): CHOL, HDL, LDLCALC, TRIG, CHOLHDL, LDLDIRECT in the last 72 hours.  No results found for: HGBA1C ------------------------------------------------------------------------------------------------------------------ No results for input(s): TSH, T4TOTAL, T3FREE, THYROIDAB in the last 72 hours.  Invalid input(s): FREET3 ------------------------------------------------------------------------------------------------------------------ No results for input(s): VITAMINB12, FOLATE, FERRITIN, TIBC, IRON, RETICCTPCT in the last 72 hours.  Coagulation profile  Recent Labs Lab 02/15/17 1747  INR 1.01    No results for input(s): DDIMER in the last 72 hours.  Cardiac Enzymes No results for input(s): CKMB, TROPONINI, MYOGLOBIN in the last 168 hours.  Invalid input(s): CK ------------------------------------------------------------------------------------------------------------------ No results found for: BNP  Micro Results No results found for this or any previous visit (from the past 240 hour(s)).  Radiology Reports Ct Head Wo Contrast  Result Date: 02/15/2017 CLINICAL DATA:  [place].  Hit head. EXAM: CT HEAD WITHOUT CONTRAST TECHNIQUE: Contiguous axial images were obtained from the base of the skull through the vertex without intravenous contrast. COMPARISON:  None. FINDINGS: Brain: No evidence of acute infarction, hemorrhage, hydrocephalus, extra-axial collection or mass lesion/mass effect. The ventricles are  in the midline without mass effect or shift. The brainstem and cerebellum appear normal. Vascular: No hyperdense vessel or unexpected calcification. Skull: No acute skull fracture.  No bone lesions. Sinuses/Orbits: The paranasal sinuses and mastoid air cells are clear  except for a mucous retention cyst or polyp in the left maxillary sinus. The globes are intact. Other: No scalp lesions or hematoma. IMPRESSION: 1. No acute intracranial findings or mass lesions. 2. No skull fracture.  The Electronically Signed   By: Marijo Sanes M.D.   On: 02/15/2017 19:04    Time Spent in minutes  30   Zettie Gootee K M.D on 02/16/2017 at 11:01 AM  Between 7am to 7pm - Pager - [phone] ( page via [facility], text pages only, please mention full 10 digit call back number).  After 7pm go to [email or web address] - password [facility]  [facility] -  Office  [phone]

## 2017-02-16 NOTE — Progress Notes (Signed)
Pt arrived to 5w11. No orders, admission notified.

## 2017-02-16 NOTE — Progress Notes (Signed)
Initial Nutrition Assessment  DOCUMENTATION CODES:   Not applicable  INTERVENTION:   -30 ml Prostat BID, each supplement provides 100 kcals and 15 grams protein -MVI daily  NUTRITION DIAGNOSIS:   Inadequate oral intake related to poor appetite as evidenced by meal completion < 25%.  GOAL:   Patient will meet greater than or equal to 90% of their needs  MONITOR:   PO intake, Supplement acceptance, Labs, Weight trends, Skin, I & O's  REASON FOR ASSESSMENT:   Malnutrition Screening Tool    ASSESSMENT:   Jeffery [name] is a 66 y.o. gentleman with a history of prostate cancer, HTN, HLD, and gout who presents to the ED for evaluation of muscle weakness and cramps, particularly in his legs.  He has had recurrent falls related to these symptoms.  He has even had muscle "twitching" in his hands.  No LOC.  No focal injury.  No change in urinary or bowel patterns.  No diarrhea.  Appetite normal.  Intermittent post-tussive emesis which he relates to cough, congestion, and phlegm.  Pt admitted with symptomatic hypokalemia. He shared with this RD that he was not taking potassium supplements as prescribed by his PCP ("I didn't know it was that important").   Spoke with pt at bedside, who reports poor appetite over the past several weeks. He complains of early satiety and food "not tasting right". Observed breakfast tray at bedside; pt consumed only a few bites of a sausage patty. Noted pt also consumed a diet coke and bag of potato chips, which pt stated is from last night.   Pt reports his UBW is between 240-260#. He is unsure if he has lost weight. Reviewed wt hx; noted ongoing wt fluctuations, including a 7.4% wt loss over the past 9 months, which is not significant for time frame.  He reports that he is very strong and active, having [place]. Nutrition-Focused physical exam completed. Findings are no fat depletion, no muscle depletion, and mild edema.   Discussed importance  of good PO intake to support healing and prevent further weight loss, especially importance of protein intake. Pt refusing Ensure supplements, due to disliking taste from trying them in the past.   Labs reviewed: CBGS: 189.   Diet Order:  Diet heart healthy/carb modified Room service appropriate? Yes; Fluid consistency: Thin  Skin:  Reviewed, no issues  Last BM:  02/15/17  Height:   Ht Readings from Last 1 Encounters:  02/16/17 6\' 2"  (1.88 m)    Weight:   Wt Readings from Last 1 Encounters:  02/16/17 224 lb 13.9 oz (102 kg)    Ideal Body Weight:  86.4 kg  BMI:  Body mass index is 28.87 kg/m.  Estimated Nutritional Needs:   Kcal:  2100-2300  Protein:  110-125 grams  Fluid:  2.1-2.3 L  EDUCATION NEEDS:   Education needs addressed  Jenine Krisher A. Jimmye Norman, RD, LDN, CDE Pager: [phone] After hours Pager: [phone]

## 2017-02-16 NOTE — Progress Notes (Signed)
Pt arrived to unit, alert and oriented x 4, ambulated from stretcher to bed with assistance. VS stable, no signs of acute distress, pt denied pain. Pt advised about valuable policy, and oriented to room and equipment. Instructed to call for assistance, call bell left within reach. Will continue to monitor pt.

## 2017-02-17 LAB — POTASSIUM: Potassium: 3.5 mmol/L (ref 3.5–5.1)

## 2017-02-17 LAB — GLUCOSE, CAPILLARY
Glucose-Capillary: 300 mg/dL — ABNORMAL HIGH (ref 65–99)
Glucose-Capillary: 122 mg/dL — ABNORMAL HIGH (ref 65–99)

## 2017-02-17 LAB — BASIC METABOLIC PANEL
Anion gap: 12 (ref 5–15)
BUN: 5 mg/dL — ABNORMAL LOW (ref 6–20)
CHLORIDE: 93 mmol/L — AB (ref 101–111)
CO2: 28 mmol/L (ref 22–32)
CREATININE: 1.16 mg/dL (ref 0.61–1.24)
Calcium: 8 mg/dL — ABNORMAL LOW (ref 8.9–10.3)
GFR calc non Af Amer: >60 mL/min (ref >60)
Glucose, Bld: 298 mg/dL — ABNORMAL HIGH (ref 65–99)
Potassium: 2.7 mmol/L — CL (ref 3.5–5.1)
SODIUM: 133 mmol/L — AB (ref 135–145)

## 2017-02-17 LAB — MAGNESIUM: MAGNESIUM: 2.3 mg/dL (ref 1.7–2.4)

## 2017-02-17 LAB — HEMOGLOBIN A1C
Hgb A1c MFr Bld: 12.8 % — ABNORMAL HIGH (ref 4.8–5.6)
Mean Plasma Glucose: 321 mg/dL

## 2017-02-17 MED ORDER — POTASSIUM CHLORIDE CRYS ER 20 MEQ PO TBCR
40.0000 meq | EXTENDED_RELEASE_TABLET | Freq: Two times a day (BID) | ORAL | Status: DC
  Administered 2017-02-17: 40 meq via ORAL
  Filled 2017-02-17: qty 2

## 2017-02-17 MED ORDER — POTASSIUM CHLORIDE CRYS ER 20 MEQ PO TBCR
40.0000 meq | EXTENDED_RELEASE_TABLET | Freq: Once | ORAL | Status: AC
  Administered 2017-02-17: 40 meq via ORAL
  Filled 2017-02-17: qty 2

## 2017-02-17 MED ORDER — SODIUM CHLORIDE 0.9 % IV SOLN
INTRAVENOUS | Status: DC
  Administered 2017-02-17: 07:00:00 via INTRAVENOUS

## 2017-02-17 MED ORDER — POTASSIUM CHLORIDE 2 MEQ/ML IV SOLN
INTRAVENOUS | Status: DC
  Filled 2017-02-17: qty 1000

## 2017-02-17 MED ORDER — SODIUM CHLORIDE 0.9 % IV SOLN
30.0000 meq | INTRAVENOUS | Status: AC
  Administered 2017-02-17 (×2): 30 meq via INTRAVENOUS
  Filled 2017-02-17 (×2): qty 15

## 2017-02-17 MED ORDER — POTASSIUM CHLORIDE CRYS ER 20 MEQ PO TBCR: 40.0000 meq | EXTENDED_RELEASE_TABLET | Freq: Two times a day (BID) | ORAL | Status: DC

## 2017-02-17 NOTE — Discharge Instructions (Signed)
Follow with Primary MD [name], MD in 3 days   Get CBC, CMP, 2 view Chest X ray checked  by Primary MD or SNF MD in 3 days ( we routinely change or add medications that can affect your baseline labs and fluid status, therefore we recommend that you get the mentioned basic workup next visit with your PCP, your PCP may decide not to get them or add new tests based on their clinical decision)  Activity: As tolerated with Full fall precautions use walker/cane & assistance as needed  Disposition Home    Diet:   Diet heart healthy/carb modified  .  For Heart failure patients - Check your Weight same time everyday, if you gain over 2 pounds, or you develop in leg swelling, experience more shortness of breath or chest pain, call your Primary MD immediately. Follow Cardiac Low Salt Diet and 1.5 lit/day fluid restriction.  On your next visit with your primary care physician please Get Medicines reviewed and adjusted.  Please request your Prim.MD to go over all Hospital Tests and Procedure/Radiological results at the follow up, please get all Hospital records sent to your Prim MD by signing hospital release before you go home.  If you experience worsening of your admission symptoms, develop shortness of breath, life threatening emergency, suicidal or homicidal thoughts you must seek medical attention immediately by calling 911 or calling your MD immediately  if symptoms less severe.  You Must read complete instructions/literature along with all the possible adverse reactions/side effects for all the Medicines you take and that have been prescribed to you. Take any new Medicines after you have completely understood and accpet all the possible adverse reactions/side effects.   Do not drive, operate heavy machinery, perform activities at heights, swimming or participation in water activities or provide baby sitting services if your were admitted for syncope or siezures until you have seen by Primary MD or a  Neurologist and advised to do so again.  Do not drive when taking Pain medications.    Do not take more than prescribed Pain, Sleep and Anxiety Medications  Special Instructions: If you have smoked or chewed Tobacco  in the last 2 yrs please stop smoking, stop any regular Alcohol  and or any Recreational drug use.  Wear Seat belts while driving.   Please note  You were cared for by a hospitalist during your hospital stay. If you have any questions about your discharge medications or the care you received while you were in the hospital after you are discharged, you can call the unit and asked to speak with the hospitalist on call if the hospitalist that took care of you is not available. Once you are discharged, your primary care physician will handle any further medical issues. Please note that NO REFILLS for any discharge medications will be authorized once you are discharged, as it is imperative that you return to your primary care physician (or establish a relationship with a primary care physician if you do not have one) for your aftercare needs so that they can reassess your need for medications and monitor your lab values.

## 2017-02-17 NOTE — Discharge Summary (Signed)
Jeffery [name]:[id number] DOB: [birth year] DOA: 02/15/2017  PCP: [name], MD  Admit date: 02/15/2017  Discharge date: 02/17/2017  Admitted From: Home   Disposition:  Home   Recommendations for Outpatient Follow-up:   Follow up with PCP in 1-2 weeks  PCP Please obtain BMP/CBC, 2 view CXR in 1week,  (see Discharge instructions)   PCP Please follow up on the following pending results: monitor BMP in 2-3 days   Home Health: None   Equipment/Devices: None  Consultations: None Discharge Condition: Stable   CODE STATUS: Full   Diet Recommendation:  Heart Healthy     Chief Complaint  Patient presents with  . Weakness  . Fall     Brief history of present illness from the day of admission and additional interim summary    Jeffery [name] a 66 y.o.gentleman with a history of prostate cancer, HTN, EKG stage IV, HLD, and gout who presents to the ED for evaluation of muscle weakness and cramps, particularly in his legs, workup suggested dehydration with severe hypokalemia.                                                                  Hospital Course   1.Severe hypokalemia due to patient taking HCTZ and not taking his potassium supplement. Was replaced aggressively IV and oral, now K stable, Patient counseled on compliance with potassium supplement at home. Symptom free, follow with PCP in 2-3 days for BMP check.  2. CK D stage IV. Creatinine close to his baseline of around 1.5-1.6.  3. HTN. Continue Norvasc.  4. Dyslipidemia. On statin.   5. Pre- DM2 - diet controlled.   Discharge diagnosis     Principal Problem:   Hypokalemia Active Problems:   AKI (acute kidney injury) ([place])   Hyponatremia    Discharge instructions    Discharge Instructions    Diet - low sodium heart healthy     Complete by:  As directed    Discharge instructions    Complete by:  As directed    Follow with Primary MD [name], MD in 3 days   Get CBC, CMP, 2 view Chest X ray checked  by Primary MD or SNF MD in 3 days ( we routinely change or add medications that can affect your baseline labs and fluid status, therefore we recommend that you get the mentioned basic workup next visit with your PCP, your PCP may decide not to get them or add new tests based on their clinical decision)  Activity: As tolerated with Full fall precautions use walker/cane & assistance as needed  Disposition Home    Diet:   Diet heart healthy/carb modified  .  For Heart failure patients - Check your Weight same time everyday, if you gain over 2 pounds, or you develop  in leg swelling, experience more shortness of breath or chest pain, call your Primary MD immediately. Follow Cardiac Low Salt Diet and 1.5 lit/day fluid restriction.  On your next visit with your primary care physician please Get Medicines reviewed and adjusted.  Please request your Prim.MD to go over all Hospital Tests and Procedure/Radiological results at the follow up, please get all Hospital records sent to your Prim MD by signing hospital release before you go home.  If you experience worsening of your admission symptoms, develop shortness of breath, life threatening emergency, suicidal or homicidal thoughts you must seek medical attention immediately by calling 911 or calling your MD immediately  if symptoms less severe.  You Must read complete instructions/literature along with all the possible adverse reactions/side effects for all the Medicines you take and that have been prescribed to you. Take any new Medicines after you have completely understood and accpet all the possible adverse reactions/side effects.   Do not drive, operate heavy machinery, perform activities at heights, swimming or participation in water activities or provide baby sitting  services if your were admitted for syncope or siezures until you have seen by Primary MD or a Neurologist and advised to do so again.  Do not drive when taking Pain medications.    Do not take more than prescribed Pain, Sleep and Anxiety Medications  Special Instructions: If you have smoked or chewed Tobacco  in the last 2 yrs please stop smoking, stop any regular Alcohol  and or any Recreational drug use.  Wear Seat belts while driving.   Please note  You were cared for by a hospitalist during your hospital stay. If you have any questions about your discharge medications or the care you received while you were in the hospital after you are discharged, you can call the unit and asked to speak with the hospitalist on call if the hospitalist that took care of you is not available. Once you are discharged, your primary care physician will handle any further medical issues. Please note that NO REFILLS for any discharge medications will be authorized once you are discharged, as it is imperative that you return to your primary care physician (or establish a relationship with a primary care physician if you do not have one) for your aftercare needs so that they can reassess your need for medications and monitor your lab values.   Increase activity slowly    Complete by:  As directed       Discharge Medications   Allergies as of 02/17/2017      Reactions   Penicillins Hives, Nausea And Vomiting, Swelling   Swelling of lips Has patient had a PCN reaction causing immediate rash, facial/tongue/throat swelling, SOB or lightheadedness with hypotension: Yes Has patient had a PCN reaction causing severe rash involving mucus membranes or skin necrosis: No Has patient had a PCN reaction that required hospitalization No Has patient had a PCN reaction occurring within the last 10 years: No If all of the above answers are "NO", then may proceed with Cephalosporin use.   Atenolol Swelling, Other (See  Comments)   Angioedema   Clonidine Derivatives Swelling, Other (See Comments)   Angioedema   Tape Rash   No plastic, "patterned" tape!!      Medication List    TAKE these medications   amLODipine 10 MG tablet Commonly known as:  NORVASC Take 1 tablet (10 mg total) by mouth daily.   benzonatate 200 MG capsule Commonly known as:  TESSALON Take  200 mg by mouth 3 (three) times daily.   chlorthalidone 25 MG tablet Commonly known as:  HYGROTON Take 25 mg by mouth daily. To keep blood pressure less than 140/90   diphenhydrAMINE 25 MG tablet Commonly known as:  BENADRYL Take 1 tablet (25 mg total) by mouth every 6 (six) hours.   doxycycline 100 MG tablet Commonly known as:  VIBRA-TABS Take 100 mg by mouth 2 (two) times daily.   glimepiride 2 MG tablet Commonly known as:  AMARYL Take 2 mg by mouth daily with breakfast.   indomethacin 50 MG capsule Commonly known as:  INDOCIN Take 50 mg by mouth daily as needed for mild pain (associated with gout flares). Reported on 12/29/2015   KLOR-CON PO Take 20 mEq by mouth daily.   metFORMIN 1000 MG tablet Commonly known as:  GLUCOPHAGE Take 1,000 mg by mouth 2 (two) times daily.   predniSONE 20 MG tablet Commonly known as:  DELTASONE Take 3 tablets (60 mg total) by mouth daily.   simvastatin 20 MG tablet Commonly known as:  ZOCOR Take 20 mg by mouth at bedtime.       Follow-up Information    FAGAN,ROY, MD. Schedule an appointment as soon as possible for a visit in 1 week(s).   Specialty:  Internal Medicine Contact information: [address] [place] [address] [phone]           Major procedures and Radiology Reports - PLEASE review detailed and final reports thoroughly  -         Ct Head Wo Contrast  Result Date: 02/15/2017 CLINICAL DATA:  [place].  Hit head. EXAM: CT HEAD WITHOUT CONTRAST TECHNIQUE: Contiguous axial images were obtained from the base of the skull through the vertex without  intravenous contrast. COMPARISON:  None. FINDINGS: Brain: No evidence of acute infarction, hemorrhage, hydrocephalus, extra-axial collection or mass lesion/mass effect. The ventricles are in the midline without mass effect or shift. The brainstem and cerebellum appear normal. Vascular: No hyperdense vessel or unexpected calcification. Skull: No acute skull fracture.  No bone lesions. Sinuses/Orbits: The paranasal sinuses and mastoid air cells are clear except for a mucous retention cyst or polyp in the left maxillary sinus. The globes are intact. Other: No scalp lesions or hematoma. IMPRESSION: 1. No acute intracranial findings or mass lesions. 2. No skull fracture.  The Electronically Signed   By: Marijo Sanes M.D.   On: 02/15/2017 19:04    Micro Results     No results found for this or any previous visit (from the past 240 hour(s)).  Today   Subjective    [name] today has no headache,no chest abdominal pain,no new weakness tingling or numbness, feels much better wants to go home today.    Objective   Blood pressure 94/69, pulse (!) 58, temperature 97.7 F (36.5 C), temperature source Oral, resp. rate 18, height 6\' 2"  (1.88 m), weight 102 kg (224 lb 13.9 oz), SpO2 95 %.   Intake/Output Summary (Last 24 hours) at 02/17/17 1457 Last data filed at 02/17/17 1056  Gross per 24 hour  Intake          1893.75 ml  Output             1075 ml  Net           818.75 ml    Exam Awake Alert, Oriented x 3, No new F.N deficits, Normal affect [place].AT,PERRAL Supple Neck,No JVD, No cervical lymphadenopathy appriciated.  Symmetrical Chest wall  movement, Good air movement bilaterally, CTAB RRR,No Gallops,Rubs or new Murmurs, No Parasternal Heave +ve B.Sounds, Abd Soft, Non tender, No organomegaly appriciated, No rebound -guarding or rigidity. No Cyanosis, Clubbing or edema, No new Rash or bruise   Data Review   CBC w Diff: Lab Results  Component Value Date   WBC 5.9 02/16/2017   HGB 13.8  02/16/2017   HCT 38.0 (L) 02/16/2017   PLT 253 02/16/2017   LYMPHOPCT 14 02/15/2017   MONOPCT 9 02/15/2017   EOSPCT 0 02/15/2017   BASOPCT 0 02/15/2017    CMP: Lab Results  Component Value Date   NA 133 (L) 02/17/2017   K 3.5 02/17/2017   CL 93 (L) 02/17/2017   CO2 28 02/17/2017   BUN 5 (L) 02/17/2017   CREATININE 1.16 02/17/2017   PROT 7.7 02/15/2017   ALBUMIN 3.4 (L) 02/15/2017   BILITOT 2.3 (H) 02/15/2017   ALKPHOS 91 02/15/2017   AST 22 02/15/2017   ALT 15 (L) 02/15/2017  .   Total Time in preparing paper work, data evaluation and todays exam - 35 minutes  Thurnell Lose M.D on 02/17/2017 at 2:57 PM  [facility]   Office  [phone]

## 2017-02-17 NOTE — Progress Notes (Signed)
CRITICAL VALUE ALERT  Critical value received:  Potassium 2.7  Date of notification:  02/17/17  Time of notification:  2811  Critical value read back:Yes.    Nurse who received alert:  Lorenso Courier  MD notified (1st page):  Schorr, NP  Time of first page:  [phone]  MD notified (2nd page):Schorr, NP  Time of second RJPV:[id number]  Responding MD:  Hilbert Bible, NP  Time MD responded:  [phone]

## 2017-11-20 ENCOUNTER — Encounter (HOSPITAL_COMMUNITY): Payer: Self-pay

## 2017-11-20 ENCOUNTER — Emergency Department (HOSPITAL_COMMUNITY)
Admission: EM | Admit: 2017-11-20 | Discharge: 2017-11-20 | Disposition: A | Discharge status: Home / Self Care | Payer: Medicare Other | Attending: Emergency Medicine | Admitting: Emergency Medicine

## 2017-11-20 DIAGNOSIS — Z9119 Patient's noncompliance with other medical treatment and regimen: Secondary | ICD-10-CM | POA: Insufficient documentation

## 2017-11-20 DIAGNOSIS — Z87891 Personal history of nicotine dependence: Secondary | ICD-10-CM | POA: Diagnosis not present

## 2017-11-20 DIAGNOSIS — Z7982 Long term (current) use of aspirin: Secondary | ICD-10-CM | POA: Insufficient documentation

## 2017-11-20 DIAGNOSIS — I10 Essential (primary) hypertension: Secondary | ICD-10-CM | POA: Diagnosis not present

## 2017-11-20 DIAGNOSIS — E039 Hypothyroidism, unspecified: Secondary | ICD-10-CM | POA: Diagnosis not present

## 2017-11-20 DIAGNOSIS — E785 Hyperlipidemia, unspecified: Secondary | ICD-10-CM | POA: Diagnosis not present

## 2017-11-20 DIAGNOSIS — E1165 Type 2 diabetes mellitus with hyperglycemia: Secondary | ICD-10-CM | POA: Diagnosis not present

## 2017-11-20 DIAGNOSIS — F10929 Alcohol use, unspecified with intoxication, unspecified: Secondary | ICD-10-CM | POA: Diagnosis not present

## 2017-11-20 DIAGNOSIS — Z79899 Other long term (current) drug therapy: Secondary | ICD-10-CM | POA: Diagnosis not present

## 2017-11-20 DIAGNOSIS — Z7984 Long term (current) use of oral hypoglycemic drugs: Secondary | ICD-10-CM | POA: Diagnosis not present

## 2017-11-20 DIAGNOSIS — Z9114 Patient's other noncompliance with medication regimen: Secondary | ICD-10-CM

## 2017-11-20 DIAGNOSIS — R739 Hyperglycemia, unspecified: Secondary | ICD-10-CM

## 2017-11-20 DIAGNOSIS — R55 Syncope and collapse: Secondary | ICD-10-CM | POA: Diagnosis present

## 2017-11-20 LAB — CBC WITH DIFFERENTIAL/PLATELET
BASOS ABS: 0 10*3/uL (ref 0.0–0.1)
BASOS PCT: 0 %
Eosinophils Absolute: 0 10*3/uL (ref 0.0–0.7)
Eosinophils Relative: 0 %
HEMATOCRIT: 34.8 % — AB (ref 39.0–52.0)
HEMOGLOBIN: 11.7 g/dL — AB (ref 13.0–17.0)
LYMPHS PCT: 25 %
Lymphs Abs: 1.4 10*3/uL (ref 0.7–4.0)
MCH: 38.7 pg — ABNORMAL HIGH (ref 26.0–34.0)
MCHC: 33.6 g/dL (ref 30.0–36.0)
MCV: 115.2 fL — ABNORMAL HIGH (ref 78.0–100.0)
Monocytes Absolute: 0.6 10*3/uL (ref 0.1–1.0)
Monocytes Relative: 10 %
NEUTROS ABS: 3.5 10*3/uL (ref 1.7–7.7)
NEUTROS PCT: 65 %
Platelets: 158 10*3/uL (ref 150–400)
RBC: 3.02 MIL/uL — AB (ref 4.22–5.81)
RDW: 16.7 % — AB (ref 11.5–15.5)
WBC: 5.5 10*3/uL (ref 4.0–10.5)

## 2017-11-20 LAB — BASIC METABOLIC PANEL
ANION GAP: 11 (ref 5–15)
BUN: 8 mg/dL (ref 6–20)
CALCIUM: 8.2 mg/dL — AB (ref 8.9–10.3)
CHLORIDE: 100 mmol/L — AB (ref 101–111)
CO2: 23 mmol/L (ref 22–32)
Creatinine, Ser: 1.02 mg/dL (ref 0.61–1.24)
GFR calc non Af Amer: >60 mL/min (ref >60)
Glucose, Bld: 419 mg/dL — ABNORMAL HIGH (ref 65–99)
POTASSIUM: 3.7 mmol/L (ref 3.5–5.1)
Sodium: 134 mmol/L — ABNORMAL LOW (ref 135–145)

## 2017-11-20 LAB — URINALYSIS, ROUTINE W REFLEX MICROSCOPIC
BILIRUBIN URINE: NEGATIVE
Bacteria, UA: NONE SEEN
HGB URINE DIPSTICK: NEGATIVE
Ketones, ur: NEGATIVE mg/dL
LEUKOCYTES UA: NEGATIVE
NITRITE: NEGATIVE
PROTEIN: NEGATIVE mg/dL
Specific Gravity, Urine: 1.02 (ref 1.005–1.030)
pH: 5 (ref 5.0–8.0)

## 2017-11-20 LAB — CBG MONITORING, ED: GLUCOSE-CAPILLARY: 287 mg/dL — AB (ref 65–99)

## 2017-11-20 LAB — RAPID URINE DRUG SCREEN, HOSP PERFORMED
AMPHETAMINES: NOT DETECTED
Barbiturates: NOT DETECTED
Benzodiazepines: NOT DETECTED
Cocaine: NOT DETECTED
Opiates: NOT DETECTED
TETRAHYDROCANNABINOL: NOT DETECTED

## 2017-11-20 LAB — ETHANOL: Alcohol, Ethyl (B): 284 mg/dL — ABNORMAL HIGH (ref <10)

## 2017-11-20 MED ORDER — METFORMIN HCL 1000 MG PO TABS: 1000.0000 mg | ORAL_TABLET | Freq: Two times a day (BID) | ORAL | 1 refills | Status: DC

## 2017-11-20 MED ORDER — SODIUM CHLORIDE 0.9 % IV BOLUS (SEPSIS)
2000.0000 mL | Freq: Once | INTRAVENOUS | Status: AC
  Administered 2017-11-20: 2000 mL via INTRAVENOUS

## 2017-11-20 NOTE — Discharge Instructions (Signed)
Your alcohol level was very high today.  Avoid all forms of alcohol.  The alcohol abuse is likely contributing to your high blood sugar.  Blood sugar was very high today, because you are not taking her medication.  Restart your metformin medication as soon as possible.  Call your primary care doctor for a follow-up appointment in the office to be seen for further evaluation and treatment as soon as possible.

## 2017-11-20 NOTE — ED Triage Notes (Signed)
Ems reports pt walked approx 1/3 mile to a [employer] and got a meal to go.  Reports felt like he was going to pass out so he called his wife and told her where he was.  Says they found him behind a dumpster.  EMS says wife told them he has early dementia.  cbg 444.  Pt alert, oriented, cooperative at this time.  History of etoh abuse but ems says pt has had 2 beers.

## 2017-11-20 NOTE — ED Provider Notes (Signed)
[facility] EMERGENCY DEPARTMENT Provider Note   CSN: [id number] Arrival date & time: 11/20/17  1626     History   Chief Complaint Chief Complaint  Patient presents with  . Loss of Consciousness    HPI Jeffery [name] is a 66 y.o. male.  Patient had walked to the store to get some beer, when he called his wife.  She found him behind a dumpster, and was unable to get him up, so an ambulance was summoned.  He was transferred here without incident or treatment.  Patient cannot recall what happened to him.  He is prescribed medications, but does not take it.  He denies alcohol abuse.  He denies headache, fever, chills, vomiting, dizziness, weakness or paresthesia.  There are no other known modifying factors.   HPI  Past Medical History:  Diagnosis Date  . Cancer [facility])    prostate  . Cervical spondylosis   . Glaucoma   . History of migraine headaches   . Hx of arthroscopic knee surgery    Left knee  . Hyperlipidemia   . Hypertension   . Impaired fasting glucose   . Progressive gait disorder   . Varicose veins     Patient Active Problem List   Diagnosis Date Noted  . AKI (acute kidney injury) ([place]) 02/16/2017  . Hyponatremia 02/16/2017  . Hypokalemia 02/15/2017  . Syncope and collapse 12/07/2012  . Hyperthyroidism 12/07/2012  . Other malaise and fatigue 12/07/2012  . Disturbance of skin sensation 12/07/2012  . Cervical spondylosis without myelopathy 12/07/2012  . Abnormality of gait 12/07/2012  . Cerebellar ataxia in diseases classified elsewhere ([place]) 12/07/2012    Past Surgical History:  Procedure Laterality Date  . CHOLECYSTECTOMY    . EYE SURGERY    . PROSTATE SURGERY         Home Medications    Prior to Admission medications   Medication Sig Start Date End Date Taking? Authorizing Provider  amLODipine (NORVASC) 10 MG tablet Take 1 tablet (10 mg total) by mouth daily. 05/07/16  Yes Ward, Delice Bison, DO  aspirin EC 81 MG tablet Take 81 mg by mouth daily.    Yes [provider]  chlorthalidone (HYGROTON) 25 MG tablet Take 25 mg by mouth daily. To keep blood pressure less than 140/90   Yes [provider]  glimepiride (AMARYL) 2 MG tablet Take 2 mg by mouth daily with breakfast.    Yes [provider]  metFORMIN (GLUCOPHAGE) 1000 MG tablet Take 1,000 mg by mouth 2 (two) times daily.   Yes [provider]  Potassium Chloride (KLOR-CON PO) Take 20 mEq by mouth daily.   Yes [provider]  simvastatin (ZOCOR) 20 MG tablet Take 20 mg by mouth at bedtime.   Yes [provider]    Family History Family History  Problem Relation Age of Onset  . Glaucoma Mother   . Arthritis Father   . Diabetes Father   . Fibromyalgia Sister   . Gait disorder Maternal Uncle        progessive gait disorder    Social History Social History   Tobacco Use  . Smoking status: Former Research scientist (life sciences)  . Smokeless tobacco: Never Used  Substance Use Topics  . Alcohol use: Yes    Comment: daily  . Drug use: No     Allergies   Penicillins; Atenolol; Clonidine derivatives; and Tape   Review of Systems Review of Systems  All other systems reviewed and are negative.  Physical Exam Updated Vital Signs BP 129/83   Pulse 71   Resp 20   Ht 6\' 2"  (1.88 m)   Wt 101.6 kg (224 lb)   SpO2 100%   BMI 28.76 kg/m   Physical Exam  Constitutional: He is oriented to person, place, and time. He appears well-developed and well-nourished. No distress.  HENT:  Head: Normocephalic and atraumatic.  Right Ear: External ear normal.  Left Ear: External ear normal.  Eyes: Conjunctivae and EOM are normal. Pupils are equal, round, and reactive to light.  Neck: Normal range of motion and phonation normal. Neck supple.  Cardiovascular: Normal rate, regular rhythm and normal heart sounds.  Pulmonary/Chest: Effort normal and breath sounds normal. He exhibits no bony tenderness.  Abdominal: Soft. There is no tenderness.    Musculoskeletal: Normal range of motion.  Neurological: He is alert and oriented to person, place, and time. No cranial nerve deficit or sensory deficit. He exhibits normal muscle tone. Coordination normal.  No dysarthria or aphasia  Skin: Skin is warm, dry and intact.  Psychiatric: He has a normal mood and affect. His behavior is normal. Judgment and thought content normal.  Nursing note and vitals reviewed.    ED Treatments / Results  Labs (all labs ordered are listed, but only abnormal results are displayed) Labs Reviewed  CBC WITH DIFFERENTIAL/PLATELET - Abnormal; Notable for the following components:      Result Value   RBC 3.02 (*)    Hemoglobin 11.7 (*)    HCT 34.8 (*)    MCV 115.2 (*)    MCH 38.7 (*)    RDW 16.7 (*)    All other components within normal limits  BASIC METABOLIC PANEL - Abnormal; Notable for the following components:   Sodium 134 (*)    Chloride 100 (*)    Glucose, Bld 419 (*)    Calcium 8.2 (*)    All other components within normal limits  ETHANOL - Abnormal; Notable for the following components:   Alcohol, Ethyl (B) 284 (*)    All other components within normal limits  URINALYSIS, ROUTINE W REFLEX MICROSCOPIC - Abnormal; Notable for the following components:   Glucose, UA >=500 (*)    Squamous Epithelial / LPF 0-5 (*)    All other components within normal limits  CBG MONITORING, ED - Abnormal; Notable for the following components:   Glucose-Capillary 287 (*)    All other components within normal limits  RAPID URINE DRUG SCREEN, HOSP PERFORMED    EKG  EKG Interpretation None       Radiology No results found.  Procedures Procedures (including critical care time)  Medications Ordered in ED Medications  sodium chloride 0.9 % bolus 2,000 mL (0 mLs Intravenous Stopped 11/20/17 1911)     Initial Impression / Assessment and Plan / ED Course  I have reviewed the triage vital signs and the nursing notes.  Pertinent labs & imaging results  that were available during my care of the patient were reviewed by me and considered in my medical decision making (see chart for details).  Clinical Course as of Nov 21 1923  Nancy Fetter Nov 20, 2017  1738 Elevated Alcohol, Ethyl (B): (!) 284 [EW]  1738 Low Sodium: (!) 134 [EW]  1738 Elevated Glucose: (!) 419 [EW]  1738 Normal Anion gap: 11 [EW]  1738 Normal WBC: 5.5 [EW]  1738 Low Hemoglobin: (!) 11.7 [EW]    Clinical Course User Index [EW] Daleen Bo, MD     Patient  Vitals for the past 24 hrs:  BP Pulse Resp SpO2 Height Weight  11/20/17 1637 129/83 71 20 100 % - -  11/20/17 1631 - - - - 6\' 2"  (1.88 m) 101.6 kg (224 lb)    7:37 PM Reevaluation with update and discussion. After initial assessment and treatment, an updated evaluation reveals he is alert and comfortable.  He now states that he has "all of my medicine at home."  He is committed to restarting his medicines.  Findings discussed with patient and wife, all questions answered. Daleen Bo     Final Clinical Impressions(s) / ED Diagnoses   Final diagnoses:  Alcoholic intoxication with complication ([place])  Hyperglycemia  Noncompliance with medications   Confusion, related to alcohol intoxication.  Elevated blood sugar related to noncompliance with treatment.  Patient has history of prostate cancer, hypertension and diabetes, all of which appear to be not currently treated and not being managed by his PCP.  Doubt CVA, or encephalopathy.  Doubt serious bacterial infection, metabolic instability or impending vascular collapse.  Nursing Notes Reviewed/ Care Coordinated Applicable Imaging Reviewed Interpretation of Laboratory Data incorporated into ED treatment  The patient appears reasonably screened and/or stabilized for discharge and I doubt any other medical condition or other [facility] requiring further screening, evaluation, or treatment in the ED at this time prior to discharge.  Plan: Home Medications-restart metformin;  Home Treatments-avoid all forms of alcohol; return here if the recommended treatment, does not improve the symptoms; Recommended follow up-PCP follow-up as soon as possible for further management and treatment    ED Discharge Orders    None       Daleen Bo, MD 11/20/17 [phone]

## 2017-11-20 NOTE — ED Notes (Signed)
Pt alert & oriented x4, stable gait. Patient given discharge instructions, paperwork & prescription(s). Patient  instructed to stop at the registration desk to finish any additional paperwork. Patient verbalized understanding. Pt left department w/ no further questions. 

## 2017-12-13 ENCOUNTER — Encounter: Payer: Self-pay | Admitting: Neurology

## 2017-12-13 ENCOUNTER — Ambulatory Visit: Payer: Medicare Other | Admitting: Neurology

## 2017-12-13 VITALS — Ht 74.0 in | Wt 216.0 lb

## 2017-12-13 DIAGNOSIS — R413 Other amnesia: Secondary | ICD-10-CM | POA: Diagnosis not present

## 2017-12-13 DIAGNOSIS — G3184 Mild cognitive impairment, so stated: Secondary | ICD-10-CM | POA: Diagnosis not present

## 2017-12-13 MED ORDER — DONEPEZIL HCL 10 MG PO TABS: 10.0000 mg | ORAL_TABLET | Freq: Every day | ORAL | 11 refills | Status: DC

## 2017-12-13 MED ORDER — MEMANTINE HCL 10 MG PO TABS: 10.0000 mg | ORAL_TABLET | Freq: Two times a day (BID) | ORAL | 11 refills | Status: DC

## 2017-12-13 NOTE — Progress Notes (Signed)
PATIENT: Jeffery [name] DOB: 11/11/1951  Chief Complaint  Patient presents with  . Memory Loss    MMSE - animals.  He is here with his wife, Jeffery [name], to have his worsening memory further evaluated.  Marland Kitchen PCP    Jeffery Noble, MD     HISTORICAL  Jeffery [name] is a 67 year old male seen in refer by his primary care Dr. Hermine [name] for evaluation of gradual onset memory loss, initial evaluation was on December 13, 2017.  He is accompanied by his wife Jeffery [name],  I reviewed and summarized the referring note, he has past medical history of hypertension, hyperlipidemia, prostate cancer, abnormal glucose level, take Metformin.  Is a retired Administrator, lives with his wife,  He was noted to have gradual onset memory loss over the past few years, especially since March 2018, he tends to repeat things, he also drank half of hard liquor on a daily basis, watches TV all day long, not sleeping well, per wife, is also irritable,  His father suffered dementia.  CT head of the brain in March 2018 no acute abnormality  Laboratory evaluations in July 2018, creatinine was 1.03, A1c was 6.7, elevated glucose 126, normal TSH 2.1,  REVIEW OF SYSTEMS: Full 14 system review of systems performed and notable only for weight loss, eye pain, snoring, feeling cold, memory loss, confusion, weakness, snoring  ALLERGIES: Allergies  Allergen Reactions  . Penicillins Hives, Nausea And Vomiting and Swelling    Swelling of lips Has patient had a PCN reaction causing immediate rash, facial/tongue/throat swelling, SOB or lightheadedness with hypotension: Yes Has patient had a PCN reaction causing severe rash involving mucus membranes or skin necrosis: No Has patient had a PCN reaction that required hospitalization No Has patient had a PCN reaction occurring within the last 10 years: No If all of the above answers are "NO", then may proceed with Cephalosporin use.   . Atenolol Swelling and Other (See Comments)   Angioedema   . Clonidine Derivatives Swelling and Other (See Comments)    Angioedema   . Tape Rash    No plastic, "patterned" tape!!    HOME MEDICATIONS: Current Outpatient Medications  Medication Sig Dispense Refill  . amLODipine (NORVASC) 10 MG tablet Take 1 tablet (10 mg total) by mouth daily. 30 tablet 1  . aspirin EC 81 MG tablet Take 81 mg by mouth daily.    . chlorthalidone (HYGROTON) 25 MG tablet Take 25 mg by mouth daily. To keep blood pressure less than 140/90    . glimepiride (AMARYL) 2 MG tablet Take 2 mg by mouth daily with breakfast.     . metFORMIN (GLUCOPHAGE) 1000 MG tablet Take 1 tablet (1,000 mg total) by mouth 2 (two) times daily. 60 tablet 1  . Potassium Chloride (KLOR-CON PO) Take 20 mEq by mouth daily.    . simvastatin (ZOCOR) 20 MG tablet Take 20 mg by mouth at bedtime.     No current facility-administered medications for this visit.     PAST MEDICAL HISTORY: Past Medical History:  Diagnosis Date  . Cancer [facility])    prostate  . Cervical spondylosis   . Glaucoma   . History of migraine headaches   . Hx of arthroscopic knee surgery    Left knee  . Hyperlipidemia   . Hypertension   . Impaired fasting glucose   . Memory loss   . Progressive gait disorder   . Varicose veins     PAST SURGICAL HISTORY: Past  Surgical History:  Procedure Laterality Date  . CHOLECYSTECTOMY    . EYE SURGERY    . PROSTATE SURGERY      FAMILY HISTORY: Family History  Problem Relation Age of Onset  . Glaucoma Mother   . Arthritis Father   . Diabetes Father   . Fibromyalgia Sister   . Gait disorder Maternal Uncle        progessive gait disorder    SOCIAL HISTORY:  Social History   Socioeconomic History  . Marital status: Married    Spouse name: Not on file  . Number of children: 2  . Years of education: HS  . Highest education level: Not on file  Social Needs  . Financial resource strain: Not on file  . Food insecurity - worry: Not on file  . Food  insecurity - inability: Not on file  . Transportation needs - medical: Not on file  . Transportation needs - non-medical: Not on file  Occupational History  . Occupation: Information systems manager  Tobacco Use  . Smoking status: Former Research scientist (life sciences)  . Smokeless tobacco: Never Used  Substance and Sexual Activity  . Alcohol use: Yes    Comment: 0.5 pint of wine daily  . Drug use: No  . Sexual activity: Not on file  Other Topics Concern  . Not on file  Social History Narrative   Lives at home with his wife.   Right-handed.   Occasional caffeine use.     PHYSICAL EXAM   Vitals:   12/13/17 1427  Weight: 216 lb (98 kg)  Height: 6\' 2"  (1.88 m)    Not recorded      Body mass index is 27.73 kg/m.  PHYSICAL EXAMNIATION:  Gen: NAD, conversant, well nourised, obese, well groomed                     Cardiovascular: Regular rate rhythm, no peripheral edema, warm, nontender. Eyes: Conjunctivae clear without exudates or hemorrhage Neck: Supple, no carotid bruits. Pulmonary: Clear to auscultation bilaterally   NEUROLOGICAL EXAM: MMSE - Mini Mental State Exam 12/13/2017  Orientation to time 3  Orientation to Place 5  Registration 3  Attention/ Calculation 5  Recall 0  Language- name 2 objects 2  Language- repeat 1  Language- follow 3 step command 3  Language- read & follow direction 1  Write a sentence 1  Copy design 1  Total score 25  animal naming 14  CRANIAL NERVES: CN II: Visual fields are full to confrontation. Fundoscopic exam is normal with sharp discs and no vascular changes. Pupils are round equal and briskly reactive to light. CN III, IV, VI: extraocular movement are normal. No ptosis. CN V: Facial sensation is intact to pinprick in all 3 divisions bilaterally. Corneal responses are intact.  CN VII: Face is symmetric with normal eye closure and smile. CN VIII: Hearing is normal to rubbing fingers CN IX, X: Palate elevates symmetrically. Phonation is normal. CN  XI: Head turning and shoulder shrug are intact CN XII: Tongue is midline with normal movements and no atrophy.  MOTOR: There is no pronator drift of out-stretched arms. Muscle bulk and tone are normal. Muscle strength is normal.  REFLEXES: Reflexes are 2+ and symmetric at the biceps, triceps, knees, and ankles. Plantar responses are flexor.  SENSORY: Intact to light touch, pinprick, positional sensation and vibratory sensation are intact in fingers and toes.  COORDINATION: Rapid alternating movements and fine finger movements are intact. There is no dysmetria  on finger-to-nose and heel-knee-shin.    GAIT/STANCE: Posture is normal. Gait is steady with normal steps, base, arm swing, and turning. Heel and toe walking are normal. Tandem gait is normal.  Romberg is absent.   DIAGNOSTIC DATA (LABS, IMAGING, TESTING) - I reviewed patient records, labs, notes, testing and imaging myself where available.   ASSESSMENT AND PLAN  YAMATO KOPF is a 67 y.o. male   Mild cognitive impairment  Most likely central nervous system degenerative disorder, possibility of vascular component  Complete evaluation with MRI of the brain,  Laboratory evaluations  Start Namenda 10 mg twice a day, donepezil 10 mg daily   Marcial Pacas, M.D. Ph.D.  [facility] [address] [place], [place] [address] Ph: [phone] Fax: [phone]  CC: Referring Provider

## 2017-12-14 ENCOUNTER — Telehealth: Payer: Self-pay | Admitting: *Deleted

## 2017-12-14 LAB — VITAMIN B12: Vitamin B-12: 410 pg/mL (ref 232–1245)

## 2017-12-14 LAB — HIV ANTIBODY (ROUTINE TESTING W REFLEX): HIV Screen 4th Generation wRfx: NONREACTIVE

## 2017-12-14 LAB — RPR: RPR Ser Ql: NONREACTIVE

## 2017-12-14 NOTE — Telephone Encounter (Signed)
Left patient a detailed message, with results, on voicemail (ok per DPR).  Provided our number to call back with any questions.  

## 2017-12-14 NOTE — Telephone Encounter (Signed)
-----   Message from Marcial Pacas, MD sent at 12/14/2017 11:09 AM EST ----- Please call patient for normal laboratory result

## 2017-12-19 ENCOUNTER — Telehealth: Payer: Self-pay | Admitting: Neurology

## 2017-12-19 NOTE — Telephone Encounter (Signed)
Pts wife is calling asking which medication is pt suppose to start now and which one to start in a month. Pts wife stated that Dr. Krista Blue had told them and they wrote it down but cant seem to find the paper. memantine (NAMENDA) 10 MG tablet and donepezil (ARICEPT) 10 MG tablet please call to discuss

## 2017-12-19 NOTE — Telephone Encounter (Signed)
Spoke to patient's wife - she has started him on memantine and will hold off starting donepezil for one month.

## 2018-01-02 ENCOUNTER — Ambulatory Visit
Admission: RE | Admit: 2018-01-02 | Discharge: 2018-01-02 | Disposition: A | Discharge status: Home / Self Care | Payer: Medicare Other | Source: Ambulatory Visit | Attending: Neurology | Admitting: Neurology

## 2018-01-02 DIAGNOSIS — R413 Other amnesia: Secondary | ICD-10-CM

## 2018-01-04 ENCOUNTER — Telehealth: Payer: Self-pay | Admitting: Neurology

## 2018-01-04 NOTE — Telephone Encounter (Signed)
Spoke to patient's wife on HIPAA - she is aware of results.  He will continue his medication and keep his pending follow up.

## 2018-01-04 NOTE — Telephone Encounter (Signed)
Please call patient, MRI of the brain showed generalized atrophy, supratentorium small vessel disease, chronic changes, will review MRI of the brain at his next follow-up visit.  IMPRESSION:  This MRI of the brain without contrast shows the following: 1.    There is moderate atrophy of the mesial temporal lobes and the corpus callosum and mild generalized cortical atrophy elsewhere. 2.    T2/FLAIR hyperintense foci consistent with moderate chronic microvascular ischemic changes in the hemispheres. 3.    There are no acute findings.

## 2018-01-11 NOTE — Telephone Encounter (Signed)
Spoke to patient - he is aware of results and will keep his pending appt in April 2019 for further discussion.

## 2018-01-11 NOTE — Telephone Encounter (Signed)
Patient calling to discuss MRI results

## 2018-01-11 NOTE — Telephone Encounter (Signed)
Left message requesting a return call.

## 2018-03-13 ENCOUNTER — Encounter: Payer: Self-pay | Admitting: Neurology

## 2018-03-13 ENCOUNTER — Ambulatory Visit: Payer: Medicare Other | Admitting: Neurology

## 2018-03-13 VITALS — BP 125/73 | HR 66 | Ht 74.0 in | Wt 226.0 lb

## 2018-03-13 DIAGNOSIS — G3184 Mild cognitive impairment, so stated: Secondary | ICD-10-CM | POA: Diagnosis not present

## 2018-03-13 NOTE — Progress Notes (Signed)
PATIENT: Jeffery [name] DOB: Nov 08, 1951  Chief Complaint  Patient presents with  . Memory Loss    Last MMSE 25/30. Today: 26/30.     HISTORICAL  Jeffery [name] is a 67 year old male seen in refer by his primary care Dr. Hermine Messick for evaluation of gradual onset memory loss, initial evaluation was on December 13, 2017.  He is accompanied by his wife Jeffery [name],  I reviewed and summarized the referring note, he has past medical history of hypertension, hyperlipidemia, prostate cancer, abnormal glucose level, take Metformin.  Is a retired Administrator, lives with his wife,  He was noted to have gradual onset memory loss over the past few years, especially since March 2018, he tends to repeat things, he also drank wine on a daily basis, watches TV all day long, not sleeping well, per wife, is also irritable,  His father suffered dementia.  CT head of the brain in March 2018 no acute abnormality  Laboratory evaluations in July 2018, creatinine was 1.03, A1c was 6.7, elevated glucose 126, normal TSH 2.1,  UPDATE March 13 2018: Laboratory evaluation showed normal negative HIV, RPR, B12, UDS, alcohol level was 284 in March 2019,  His father has dementia.  He continues to drink twice daily, I have personally reviewed MRI of the brain, generalized atrophy, supratentorium small vessel disease.  REVIEW OF SYSTEMS: Full 14 system review of systems performed and notable only for weight loss, eye pain, snoring, feeling cold, memory loss, confusion, weakness, snoring  ALLERGIES: Allergies  Allergen Reactions  . Penicillins Hives, Nausea And Vomiting and Swelling    Swelling of lips Has patient had a PCN reaction causing immediate rash, facial/tongue/throat swelling, SOB or lightheadedness with hypotension: Yes Has patient had a PCN reaction causing severe rash involving mucus membranes or skin necrosis: No Has patient had a PCN reaction that required hospitalization No Has patient had a PCN  reaction occurring within the last 10 years: No If all of the above answers are "NO", then may proceed with Cephalosporin use.   . Atenolol Swelling and Other (See Comments)    Angioedema   . Clonidine Derivatives Swelling and Other (See Comments)    Angioedema   . Tape Rash    No plastic, "patterned" tape!!    HOME MEDICATIONS: Current Outpatient Medications  Medication Sig Dispense Refill  . amLODipine (NORVASC) 10 MG tablet Take 1 tablet (10 mg total) by mouth daily. 30 tablet 1  . aspirin EC 81 MG tablet Take 81 mg by mouth daily.    Marland Kitchen donepezil (ARICEPT) 10 MG tablet Take 1 tablet (10 mg total) by mouth at bedtime. 30 tablet 11  . glimepiride (AMARYL) 2 MG tablet Take 2 mg by mouth daily with breakfast.     . memantine (NAMENDA) 10 MG tablet Take 1 tablet (10 mg total) by mouth 2 (two) times daily. 60 tablet 11  . metFORMIN (GLUCOPHAGE) 1000 MG tablet Take 1 tablet (1,000 mg total) by mouth 2 (two) times daily. 60 tablet 1  . Potassium Chloride (KLOR-CON PO) Take 20 mEq by mouth daily.    . simvastatin (ZOCOR) 20 MG tablet Take 20 mg by mouth at bedtime.     No current facility-administered medications for this visit.     PAST MEDICAL HISTORY: Past Medical History:  Diagnosis Date  . Cancer [facility])    prostate  . Cervical spondylosis   . Glaucoma   . History of migraine headaches   . Hx of arthroscopic  knee surgery    Left knee  . Hyperlipidemia   . Hypertension   . Impaired fasting glucose   . Memory loss   . Progressive gait disorder   . Varicose veins     PAST SURGICAL HISTORY: Past Surgical History:  Procedure Laterality Date  . CHOLECYSTECTOMY    . EYE SURGERY    . PROSTATE SURGERY      FAMILY HISTORY: Family History  Problem Relation Age of Onset  . Glaucoma Mother   . Arthritis Father   . Diabetes Father   . Fibromyalgia Sister   . Gait disorder Maternal Uncle        progessive gait disorder    SOCIAL HISTORY:  Social History    Socioeconomic History  . Marital status: Married    Spouse name: Not on file  . Number of children: 2  . Years of education: HS  . Highest education level: Not on file  Occupational History  . Occupation: Information systems manager  Social Needs  . Financial resource strain: Not on file  . Food insecurity:    Worry: Not on file    Inability: Not on file  . Transportation needs:    Medical: Not on file    Non-medical: Not on file  Tobacco Use  . Smoking status: Former Research scientist (life sciences)  . Smokeless tobacco: Never Used  Substance and Sexual Activity  . Alcohol use: Yes    Comment: 0.5 pint of wine daily  . Drug use: No  . Sexual activity: Not on file  Lifestyle  . Physical activity:    Days per week: Not on file    Minutes per session: Not on file  . Stress: Not on file  Relationships  . Social connections:    Talks on phone: Not on file    Gets together: Not on file    Attends religious service: Not on file    Active member of club or organization: Not on file    Attends meetings of clubs or organizations: Not on file    Relationship status: Not on file  . Intimate partner violence:    Fear of current or ex partner: Not on file    Emotionally abused: Not on file    Physically abused: Not on file    Forced sexual activity: Not on file  Other Topics Concern  . Not on file  Social History Narrative   Lives at home with his wife.   Right-handed.   Occasional caffeine use.     PHYSICAL EXAM   Vitals:   03/13/18 1520  BP: 125/73  Pulse: 66  Weight: 226 lb (102.5 kg)  Height: 6\' 2"  (1.88 m)    Not recorded      Body mass index is 29.02 kg/m.  PHYSICAL EXAMNIATION:  Gen: NAD, conversant, well nourised, obese, well groomed                     Cardiovascular: Regular rate rhythm, no peripheral edema, warm, nontender. Eyes: Conjunctivae clear without exudates or hemorrhage Neck: Supple, no carotid bruits. Pulmonary: Clear to auscultation bilaterally    NEUROLOGICAL EXAM: MMSE - Mini Mental State Exam 03/13/2018 12/13/2017  Orientation to time 4 3  Orientation to Place 5 5  Registration 3 3  Attention/ Calculation 5 5  Recall 0 0  Language- name 2 objects 2 2  Language- repeat 1 1  Language- follow 3 step command 3 3  Language- read & follow direction 1  1  Write a sentence 1 1  Copy design 1 1  Total score 26 25    CRANIAL NERVES: CN II: Visual fields are full to confrontation. Fundoscopic exam is normal with sharp discs and no vascular changes. Pupils are round equal and briskly reactive to light. CN III, IV, VI: extraocular movement are normal. No ptosis. CN V: Facial sensation is intact to pinprick in all 3 divisions bilaterally. Corneal responses are intact.  CN VII: Face is symmetric with normal eye closure and smile. CN VIII: Hearing is normal to rubbing fingers CN IX, X: Palate elevates symmetrically. Phonation is normal. CN XI: Head turning and shoulder shrug are intact CN XII: Tongue is midline with normal movements and no atrophy.  MOTOR: There is no pronator drift of out-stretched arms. Muscle bulk and tone are normal. Muscle strength is normal.  REFLEXES: Reflexes are 2+ and symmetric at the biceps, triceps, knees, and ankles. Plantar responses are flexor.  SENSORY: Intact to light touch, pinprick, positional sensation and vibratory sensation are intact in fingers and toes.  COORDINATION: Rapid alternating movements and fine finger movements are intact. There is no dysmetria on finger-to-nose and heel-knee-shin.    GAIT/STANCE: Posture is normal. Gait is steady with normal steps, base, arm swing, and turning. Heel and toe walking are normal. Tandem gait is normal.  Romberg is absent.   DIAGNOSTIC DATA (LABS, IMAGING, TESTING) - I reviewed patient records, labs, notes, testing and imaging myself where available.   ASSESSMENT AND PLAN  Jeffery [name] is a 67 y.o. male   Mild cognitive impairment  Most  likely central nervous system degenerative disorder, with superimposed daily alcohol use,  Family history of dementia,  Laboratory evaluations showed no treatable etiology  Continue Namenda 10 mg twice a day, donepezil 10 mg daily  I have encouraged him to continue moderate exercise  Marcial Pacas, M.D. Ph.D.  [facility] [address] [place], [place] [address] Ph: [phone] Fax: [phone]  CC: Referring Provider

## 2018-03-15 ENCOUNTER — Other Ambulatory Visit: Payer: Self-pay

## 2018-03-15 ENCOUNTER — Encounter (HOSPITAL_COMMUNITY): Payer: Self-pay | Admitting: *Deleted

## 2018-03-15 ENCOUNTER — Emergency Department (HOSPITAL_COMMUNITY)
Admission: EM | Admit: 2018-03-15 | Discharge: 2018-03-15 | Disposition: A | Discharge status: Home / Self Care | Payer: Medicare Other | Attending: Emergency Medicine | Admitting: Emergency Medicine

## 2018-03-15 DIAGNOSIS — Z87891 Personal history of nicotine dependence: Secondary | ICD-10-CM | POA: Insufficient documentation

## 2018-03-15 DIAGNOSIS — Z79899 Other long term (current) drug therapy: Secondary | ICD-10-CM | POA: Diagnosis not present

## 2018-03-15 DIAGNOSIS — N390 Urinary tract infection, site not specified: Secondary | ICD-10-CM

## 2018-03-15 DIAGNOSIS — Z8546 Personal history of malignant neoplasm of prostate: Secondary | ICD-10-CM | POA: Diagnosis not present

## 2018-03-15 DIAGNOSIS — Z7982 Long term (current) use of aspirin: Secondary | ICD-10-CM | POA: Insufficient documentation

## 2018-03-15 DIAGNOSIS — I10 Essential (primary) hypertension: Secondary | ICD-10-CM | POA: Diagnosis not present

## 2018-03-15 DIAGNOSIS — R319 Hematuria, unspecified: Secondary | ICD-10-CM | POA: Diagnosis present

## 2018-03-15 LAB — URINALYSIS, ROUTINE W REFLEX MICROSCOPIC

## 2018-03-15 LAB — URINALYSIS, MICROSCOPIC (REFLEX)

## 2018-03-15 MED ORDER — CIPROFLOXACIN HCL 500 MG PO TABS: 500.0000 mg | ORAL_TABLET | Freq: Two times a day (BID) | ORAL | 0 refills | Status: DC

## 2018-03-15 MED ORDER — CIPROFLOXACIN HCL 250 MG PO TABS
500.0000 mg | ORAL_TABLET | Freq: Once | ORAL | Status: AC
  Administered 2018-03-15: 500 mg via ORAL
  Filled 2018-03-15: qty 2

## 2018-03-15 NOTE — ED Notes (Signed)
Pt states that urine sample was collected earlier.

## 2018-03-15 NOTE — ED Provider Notes (Signed)
[facility] EMERGENCY DEPARTMENT Provider Note   CSN: [id number] Arrival date & time: 03/15/18  1809     History   Chief Complaint Chief Complaint  Patient presents with  . Hematuria    HPI Jeffery [name] is a 67 y.o. male.  HPI  The patient is a 67 year old male, chief complaint of hematuria  Several years ago the patient underwent treatment with a prostatectomy and radiation with 30 treatments of radiation, afterwards he had some ongoing hematuria and required some cautery through a cystoscopy procedure, this was performed in a another hospital system.  He states that over the last several months he has had some more hematuria, increasing frequency, is now passing some blood clots which makes it somewhat difficult to urinate.  He denies fevers abdominal pain or back pain, today he had some difficulty getting the urine out and that is why he came to the hospital.  As he was sitting in the waiting room he tried again and was able to push out a blood clot and then the free flow of urine behind it.  No fevers, no dysuria, no other complaints.  He does take a daily aspirin  Past Medical History:  Diagnosis Date  . Cancer [facility])    prostate  . Cervical spondylosis   . Glaucoma   . History of migraine headaches   . Hx of arthroscopic knee surgery    Left knee  . Hyperlipidemia   . Hypertension   . Impaired fasting glucose   . Memory loss   . Progressive gait disorder   . Varicose veins     Patient Active Problem List   Diagnosis Date Noted  . Mild cognitive impairment 12/13/2017  . AKI (acute kidney injury) ([place]) 02/16/2017  . Hyponatremia 02/16/2017  . Hypokalemia 02/15/2017  . Syncope and collapse 12/07/2012  . Hyperthyroidism 12/07/2012  . Other malaise and fatigue 12/07/2012  . Disturbance of skin sensation 12/07/2012  . Cervical spondylosis without myelopathy 12/07/2012  . Abnormality of gait 12/07/2012  . Cerebellar ataxia in diseases classified elsewhere ([place])  12/07/2012    Past Surgical History:  Procedure Laterality Date  . CHOLECYSTECTOMY    . EYE SURGERY    . PROSTATE SURGERY          Home Medications    Prior to Admission medications   Medication Sig Start Date End Date Taking? Authorizing Provider  amLODipine (NORVASC) 10 MG tablet Take 1 tablet (10 mg total) by mouth daily. 05/07/16  Yes Ward, Delice Bison, DO  aspirin EC 81 MG tablet Take 81 mg by mouth daily.   Yes [provider]  donepezil (ARICEPT) 10 MG tablet Take 1 tablet (10 mg total) by mouth at bedtime. 12/13/17  Yes Marcial Pacas, MD  glimepiride (AMARYL) 2 MG tablet Take 2 mg by mouth daily with breakfast.    Yes [provider]  memantine (NAMENDA) 10 MG tablet Take 1 tablet (10 mg total) by mouth 2 (two) times daily. 12/13/17  Yes Marcial Pacas, MD  Potassium Chloride (KLOR-CON PO) Take 10 mEq by mouth 2 (two) times daily.    Yes [provider]  simvastatin (ZOCOR) 20 MG tablet Take 20 mg by mouth at bedtime.   Yes [provider]  ciprofloxacin (CIPRO) 500 MG tablet Take 1 tablet (500 mg total) by mouth 2 (two) times daily. 03/15/18   Noemi Chapel, MD    Family History Family History  Problem Relation Age of Onset  . Glaucoma Mother   .  Arthritis Father   . Diabetes Father   . Fibromyalgia Sister   . Gait disorder Maternal Uncle        progessive gait disorder    Social History Social History   Tobacco Use  . Smoking status: Former Research scientist (life sciences)  . Smokeless tobacco: Never Used  Substance Use Topics  . Alcohol use: Yes    Comment: 0.5 pint of wine daily  . Drug use: No     Allergies   Penicillins; Atenolol; Clonidine derivatives; and Tape   Review of Systems Review of Systems  Constitutional: Negative for fever.  Respiratory: Negative for cough and shortness of breath.   Gastrointestinal: Negative for abdominal pain.  Genitourinary: Positive for hematuria. Negative for penile pain, penile swelling and scrotal swelling.    Musculoskeletal: Negative for back pain.     Physical Exam Updated Vital Signs BP 120/76 (BP Location: Right Arm)   Pulse 62   Temp 97.7 F (36.5 C) Comment: pt had cold drink  Resp 20   Ht 6\' 2"  (1.88 m)   Wt 102.5 kg (226 lb)   SpO2 100%   BMI 29.02 kg/m   Physical Exam  Constitutional: He appears well-developed and well-nourished. No distress.  HENT:  Head: Normocephalic and atraumatic.  Mouth/Throat: Oropharynx is clear and moist. No oropharyngeal exudate.  Eyes: Pupils are equal, round, and reactive to light. Conjunctivae and EOM are normal. Right eye exhibits no discharge. Left eye exhibits no discharge. No scleral icterus.  Neck: Normal range of motion. Neck supple. No JVD present. No thyromegaly present.  Cardiovascular: Normal rate, regular rhythm, normal heart sounds and intact distal pulses. Exam reveals no gallop and no friction rub.  No murmur heard. Pulmonary/Chest: Effort normal and breath sounds normal. No respiratory distress. He has no wheezes. He has no rales.  Abdominal: Soft. Bowel sounds are normal. He exhibits no distension and no mass. There is no tenderness.  Soft nontender abdomen, no masses or fullness in the suprapubic region  Genitourinary:  Genitourinary Comments: Normal-appearing penis scrotum and testicles, he is uncircumcised, he does have some blood at the urethral meatus  Musculoskeletal: Normal range of motion. He exhibits no edema or tenderness.  Lymphadenopathy:    He has no cervical adenopathy.  Neurological: He is alert. Coordination normal.  Skin: Skin is warm and dry. No rash noted. No erythema.  Psychiatric: He has a normal mood and affect. His behavior is normal.  Nursing note and vitals reviewed.    ED Treatments / Results  Labs (all labs ordered are listed, but only abnormal results are displayed) Labs Reviewed  URINALYSIS, ROUTINE W REFLEX MICROSCOPIC - Abnormal; Notable for the following components:      Result Value    Color, Urine RED (*)    APPearance TURBID (*)    Glucose, UA   (*)    Value: TEST NOT REPORTED DUE TO COLOR INTERFERENCE OF URINE PIGMENT   Hgb urine dipstick   (*)    Value: TEST NOT REPORTED DUE TO COLOR INTERFERENCE OF URINE PIGMENT   Bilirubin Urine   (*)    Value: TEST NOT REPORTED DUE TO COLOR INTERFERENCE OF URINE PIGMENT   Ketones, ur   (*)    Value: TEST NOT REPORTED DUE TO COLOR INTERFERENCE OF URINE PIGMENT   Protein, ur   (*)    Value: TEST NOT REPORTED DUE TO COLOR INTERFERENCE OF URINE PIGMENT   Nitrite   (*)    Value: TEST NOT REPORTED DUE TO  COLOR INTERFERENCE OF URINE PIGMENT   Leukocytes, UA   (*)    Value: TEST NOT REPORTED DUE TO COLOR INTERFERENCE OF URINE PIGMENT   All other components within normal limits  URINALYSIS, MICROSCOPIC (REFLEX) - Abnormal; Notable for the following components:   Bacteria, UA MANY (*)    Squamous Epithelial / LPF 0-5 (*)    All other components within normal limits  URINE CULTURE    EKG None  Radiology No results found.  Procedures Procedures (including critical care time)  Medications Ordered in ED Medications  ciprofloxacin (CIPRO) tablet 500 mg (500 mg Oral Given 03/15/18 2220)     Initial Impression / Assessment and Plan / ED Course  I have reviewed the triage vital signs and the nursing notes.  Pertinent labs & imaging results that were available during my care of the patient were reviewed by me and considered in my medical decision making (see chart for details).  Clinical Course as of Mar 16 2255  Wed Mar 15, 2018  2213 Urinalysis difficult to interpret secondary to color however the patient is seen to have many bacteria and with his increased amounts of bleeding and clots we will treat for urinary tract infection, urine will be cultured, patient stable for discharge and has been able to spontaneously urinate.   [BM]    Clinical Course User Index [BM] Noemi Chapel, MD   Recurrent chronic and ongoing  hematuria, needs follow-up with urology, attempting to urinate at this time, suspect the patient will likely need some increased fluid intake to help with flow, nontoxic, no indication for advanced imaging.  Possible UTI, Cipro, follow-up, culture sent  Final Clinical Impressions(s) / ED Diagnoses   Final diagnoses:  Hematuria, unspecified type  Lower urinary tract infectious disease    ED Discharge Orders        Ordered    ciprofloxacin (CIPRO) 500 MG tablet  2 times daily     03/15/18 2255       Noemi Chapel, MD 03/15/18 2256

## 2018-03-15 NOTE — ED Triage Notes (Signed)
Pt states he has been seeing blood clots in his urine; pt states he has had the problem x 2 years but has recently been having more blood in urine

## 2018-03-15 NOTE — Discharge Instructions (Signed)
Drink plenty of fluids, make sure that you are urinating every 1 or 2 hours to help the urine past and to reduce blood clots.  Take Cipro twice a day for 7 days to treat potential infection.  Please call the urology office of your choice or the one listed above for close follow-up.  Return to the emergency department if you are unable to urinate or have continual blood clots that are becoming bigger or obstructing urinary flow

## 2018-03-17 LAB — URINE CULTURE: Culture: NO GROWTH

## 2018-03-21 ENCOUNTER — Encounter (HOSPITAL_COMMUNITY): Payer: Self-pay | Admitting: Emergency Medicine

## 2018-03-21 ENCOUNTER — Other Ambulatory Visit: Payer: Self-pay

## 2018-03-21 ENCOUNTER — Emergency Department (HOSPITAL_COMMUNITY)
Admission: EM | Admit: 2018-03-21 | Discharge: 2018-03-21 | Disposition: A | Discharge status: Home / Self Care | Payer: Medicare Other | Attending: Emergency Medicine | Admitting: Emergency Medicine

## 2018-03-21 ENCOUNTER — Emergency Department (HOSPITAL_COMMUNITY): Payer: Medicare Other

## 2018-03-21 DIAGNOSIS — Z79899 Other long term (current) drug therapy: Secondary | ICD-10-CM | POA: Diagnosis not present

## 2018-03-21 DIAGNOSIS — Z87891 Personal history of nicotine dependence: Secondary | ICD-10-CM | POA: Diagnosis not present

## 2018-03-21 DIAGNOSIS — R319 Hematuria, unspecified: Secondary | ICD-10-CM | POA: Diagnosis present

## 2018-03-21 DIAGNOSIS — Z7982 Long term (current) use of aspirin: Secondary | ICD-10-CM | POA: Insufficient documentation

## 2018-03-21 DIAGNOSIS — I10 Essential (primary) hypertension: Secondary | ICD-10-CM | POA: Diagnosis not present

## 2018-03-21 LAB — COMPREHENSIVE METABOLIC PANEL
ALBUMIN: 4.4 g/dL (ref 3.5–5.0)
ALK PHOS: 147 U/L — AB (ref 38–126)
ALT: 23 U/L (ref 17–63)
AST: 35 U/L (ref 15–41)
Anion gap: 15 (ref 5–15)
BILIRUBIN TOTAL: 1.1 mg/dL (ref 0.3–1.2)
BUN: 16 mg/dL (ref 6–20)
CALCIUM: 9.4 mg/dL (ref 8.9–10.3)
CO2: 18 mmol/L — AB (ref 22–32)
Chloride: 101 mmol/L (ref 101–111)
Creatinine, Ser: 1.42 mg/dL — ABNORMAL HIGH (ref 0.61–1.24)
GFR calc Af Amer: 58 mL/min — ABNORMAL LOW (ref >60)
GFR calc non Af Amer: 50 mL/min — ABNORMAL LOW (ref >60)
GLUCOSE: 397 mg/dL — AB (ref 65–99)
Potassium: 4 mmol/L (ref 3.5–5.1)
Sodium: 134 mmol/L — ABNORMAL LOW (ref 135–145)
TOTAL PROTEIN: 8.9 g/dL — AB (ref 6.5–8.1)

## 2018-03-21 LAB — CBC WITH DIFFERENTIAL/PLATELET
BASOS ABS: 0 10*3/uL (ref 0.0–0.1)
BASOS PCT: 1 %
Eosinophils Absolute: 0 10*3/uL (ref 0.0–0.7)
Eosinophils Relative: 0 %
HEMATOCRIT: 41.5 % (ref 39.0–52.0)
HEMOGLOBIN: 13.8 g/dL (ref 13.0–17.0)
Lymphocytes Relative: 30 %
Lymphs Abs: 1.8 10*3/uL (ref 0.7–4.0)
MCH: 34.3 pg — ABNORMAL HIGH (ref 26.0–34.0)
MCHC: 33.3 g/dL (ref 30.0–36.0)
MCV: 103.2 fL — ABNORMAL HIGH (ref 78.0–100.0)
MONOS PCT: 9 %
Monocytes Absolute: 0.6 10*3/uL (ref 0.1–1.0)
NEUTROS ABS: 3.5 10*3/uL (ref 1.7–7.7)
Neutrophils Relative %: 60 %
Platelets: 174 10*3/uL (ref 150–400)
RBC: 4.02 MIL/uL — AB (ref 4.22–5.81)
RDW: 14.1 % (ref 11.5–15.5)
WBC: 5.9 10*3/uL (ref 4.0–10.5)

## 2018-03-21 LAB — URINALYSIS, ROUTINE W REFLEX MICROSCOPIC
KETONES UR: 40 mg/dL — AB
Nitrite: POSITIVE — AB
PH: 6.5 (ref 5.0–8.0)
Specific Gravity, Urine: 1.015 (ref 1.005–1.030)

## 2018-03-21 LAB — URINALYSIS, MICROSCOPIC (REFLEX): RBC / HPF: >50 RBC/hpf (ref 0–5)

## 2018-03-21 MED ORDER — CIPROFLOXACIN HCL 500 MG PO TABS: 500.0000 mg | ORAL_TABLET | Freq: Two times a day (BID) | ORAL | 0 refills | Status: DC

## 2018-03-21 MED ORDER — IOPAMIDOL (ISOVUE-300) INJECTION 61%
100.0000 mL | Freq: Once | INTRAVENOUS | Status: AC | PRN
  Administered 2018-03-21: 100 mL via INTRAVENOUS

## 2018-03-21 MED ORDER — SODIUM CHLORIDE 0.9 % IV BOLUS
500.0000 mL | Freq: Once | INTRAVENOUS | Status: AC
  Administered 2018-03-21: 500 mL via INTRAVENOUS

## 2018-03-21 NOTE — ED Triage Notes (Signed)
Pt reports recently finished abx for UTI. Pt reports "as longs as I was on medicine, I was able to pee." pt reports last void "around lunch". Pt reports I feel the urge frequently but reports limited output with "drops of blood".

## 2018-03-21 NOTE — Discharge Instructions (Addendum)
Continue antibiotic.  You must see your urologist to reassess your urinary system.  Additionally, there is a possibility of some irregularities on your liver.  You will need an MRI of your abdomen as an outpatient.  Show this information to any of the doctors that you see.

## 2018-03-22 NOTE — ED Provider Notes (Signed)
[facility] EMERGENCY DEPARTMENT Provider Note   CSN: [id number] Arrival date & time: 03/21/18  1748     History   Chief Complaint Chief Complaint  Patient presents with  . Hematuria    HPI Jeffery [name] is a 67 y.o. male.  Patient presents with persistent hematuria for greater than 1 week.  He was seen in the emergency department on 03/15/18 with similar symptoms and started on Cipro.  He apparently has a history of prostate cancer and prostatectomy by Dr. Allyn Kenner in [place], [place].  He has not followed up with him at this point.  No chest pain, dyspnea, weakness, flank pain, dysuria.  He does have frequent urination.     Past Medical History:  Diagnosis Date  . Cancer [facility])    prostate  . Cervical spondylosis   . Glaucoma   . History of migraine headaches   . Hx of arthroscopic knee surgery    Left knee  . Hyperlipidemia   . Hypertension   . Impaired fasting glucose   . Memory loss   . Progressive gait disorder   . Varicose veins     Patient Active Problem List   Diagnosis Date Noted  . Mild cognitive impairment 12/13/2017  . AKI (acute kidney injury) ([place]) 02/16/2017  . Hyponatremia 02/16/2017  . Hypokalemia 02/15/2017  . Syncope and collapse 12/07/2012  . Hyperthyroidism 12/07/2012  . Other malaise and fatigue 12/07/2012  . Disturbance of skin sensation 12/07/2012  . Cervical spondylosis without myelopathy 12/07/2012  . Abnormality of gait 12/07/2012  . 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 CHOLECYSTECTOMY    . EYE SURGERY    . PROSTATE SURGERY          Home Medications    Prior to Admission medications   Medication Sig Start Date End Date Taking? Authorizing Provider  amLODipine (NORVASC) 10 MG tablet Take 1 tablet (10 mg total) by mouth daily. 05/07/16  Yes Ward, Delice Bison, DO  aspirin EC 81 MG tablet Take 81 mg by mouth daily.   Yes [provider]  donepezil (ARICEPT) 10 MG tablet Take 1 tablet (10 mg total) by mouth at bedtime. 12/13/17  Yes Marcial Pacas, MD  glimepiride (AMARYL) 2 MG tablet Take 2 mg by mouth daily with breakfast.    Yes [provider]  memantine (NAMENDA) 10 MG tablet Take 1 tablet (10 mg total) by mouth 2 (two) times daily. 12/13/17  Yes Marcial Pacas, MD  Potassium Chloride (KLOR-CON PO) Take 10 mEq by mouth 2 (two) times daily.    Yes [provider]  simvastatin (ZOCOR) 20 MG tablet Take 20 mg by mouth at bedtime.   Yes [provider]  ciprofloxacin (CIPRO) 500 MG tablet Take 1 tablet (500 mg total) by mouth 2 (two) times daily. 03/21/18   Nat Christen, MD    Family History Family History  Problem Relation Age of Onset  . Glaucoma Mother   . Arthritis Father   . Diabetes Father   . Fibromyalgia Sister   . Gait disorder Maternal Uncle        progessive gait disorder    Social History Social History   Tobacco Use  . Smoking status: Former Research scientist (life sciences)  . Smokeless tobacco: Never Used  Substance Use Topics  . Alcohol use: Yes    Comment: 0.5 pint of wine daily  . Drug use: No  Allergies   Penicillins; Atenolol; Clonidine derivatives; and Tape   Review of Systems Review of Systems  All other systems reviewed and are negative.    Physical Exam Updated Vital Signs BP 132/78 (BP Location: Left Arm)   Pulse 64   Temp 97.7 F (36.5 C) (Oral)   Resp 16   Ht 6\' 2"  (1.88 m)   Wt 102.5 kg (226 lb)   SpO2 96%   BMI 29.02 kg/m   Physical Exam  Constitutional: He is oriented to person, place, and time. He appears well-developed and well-nourished.  HENT:  Head: Normocephalic and atraumatic.  Eyes: Conjunctivae are normal.  Neck: Neck supple.  Cardiovascular: Normal rate and regular rhythm.  Pulmonary/Chest: Effort normal and breath sounds normal.  Abdominal: Soft. Bowel sounds are normal.  Genitourinary:  Genitourinary Comments: No CVAT.    Musculoskeletal: Normal range of motion.  Neurological: He is alert and oriented to person, place, and time.  Skin: Skin is warm and dry.  Psychiatric: He has a normal mood and affect. His behavior is normal.  Nursing note and vitals reviewed.    ED Treatments / Results  Labs (all labs ordered are listed, but only abnormal results are displayed) Labs Reviewed  CBC WITH DIFFERENTIAL/PLATELET - Abnormal; Notable for the following components:      Result Value   RBC 4.02 (*)    MCV 103.2 (*)    MCH 34.3 (*)    All other components within normal limits  COMPREHENSIVE METABOLIC PANEL - Abnormal; Notable for the following components:   Sodium 134 (*)    CO2 18 (*)    Glucose, Bld 397 (*)    Creatinine, Ser 1.42 (*)    Total Protein 8.9 (*)    Alkaline Phosphatase 147 (*)    GFR calc non Af Amer 50 (*)    GFR calc Af Amer 58 (*)    All other components within normal limits  URINALYSIS, ROUTINE W REFLEX MICROSCOPIC - Abnormal; Notable for the following components:   Color, Urine RED (*)    APPearance TURBID (*)    Glucose, UA >=500 (*)    Hgb urine dipstick LARGE (*)    Bilirubin Urine MODERATE (*)    Ketones, ur 40 (*)    Protein, ur >300 (*)    Nitrite POSITIVE (*)    Leukocytes, UA MODERATE (*)    All other components within normal limits  URINALYSIS, MICROSCOPIC (REFLEX) - Abnormal; Notable for the following components:   Bacteria, UA FEW (*)    All other components within normal limits  URINE CULTURE    EKG None  Radiology Ct Abdomen Pelvis W Contrast  Result Date: 03/21/2018 CLINICAL DATA:  Increased frequency with blood in urine EXAM: CT ABDOMEN AND PELVIS WITH CONTRAST TECHNIQUE: Multidetector CT imaging of the abdomen and pelvis was performed using the standard protocol following bolus administration of intravenous contrast. CONTRAST:  14mL ISOVUE-300 IOPAMIDOL (ISOVUE-300) INJECTION 61% COMPARISON:  12/31/2015 ultrasound, CT 11/04/2014 FINDINGS: Lower chest:  Lung bases demonstrate linear atelectasis or scar in the right base. No acute consolidation or effusion. Heart size upper normal. Mild coronary vascular calcification. Hepatobiliary: Heterogeneous attenuation possible vague hypodense lesion at the dome of the right liver and near the gallbladder fossa. Surgical clips in the gallbladder fossa. Peri hepatic coarse calcifications along the right lobe of the liver, minimal increased compared to prior. No biliary enlargement Pancreas: Unremarkable. No pancreatic ductal dilatation or surrounding inflammatory changes. Spleen: Normal in size without focal abnormality.  Adrenals/Urinary Tract: Adrenal glands are within normal limits. No hydronephrosis. Thick-walled appearance of the bladder. Stomach/Bowel: Stomach is within normal limits. Appendix appears normal. No evidence of bowel wall thickening, distention, or inflammatory changes. Vascular/Lymphatic: Mild aortic atherosclerosis. No aneurysmal dilatation. Subcentimeter retroperitoneal lymph nodes. Reproductive: No mass. Other: Negative for free air or free fluid. Musculoskeletal: No acute or significant osseous findings. IMPRESSION: 1. Thick-walled appearance of the urinary bladder, possible cystitis but with similar compared to multiple prior CT examinations. 2. Heterogeneous attenuation of the liver with possible hypo dense lesions at the right hepatic dome and near the porta hepatis. When the patient is clinically stable and able to follow directions and hold their breath (preferably as an outpatient) further evaluation with dedicated abdominal MRI should be considered. Electronically Signed   By: Donavan Foil M.D.   On: 03/21/2018 21:17    Procedures Procedures (including critical care time)  Medications Ordered in ED Medications  sodium chloride 0.9 % bolus 500 mL (0 mLs Intravenous Stopped 03/21/18 2218)  iopamidol (ISOVUE-300) 61 % injection 100 mL (100 mLs Intravenous Contrast Given 03/21/18 2043)      Initial Impression / Assessment and Plan / ED Course  I have reviewed the triage vital signs and the nursing notes.  Pertinent labs & imaging results that were available during my care of the patient were reviewed by me and considered in my medical decision making (see chart for details).     Patient presents with persistent hematuria.  Hemoglobin stable.  Urinalysis reveals large amount of blood and minimal evidence of infection.  CT abdomen pelvis shows thickened bladder wall and irregularities in the liver.  I discussed the possibility of cancer with the patient and his wife.  He understands to follow-up with his urologist.  Will continue Cipro 500 mg for the time being.  Final Clinical Impressions(s) / ED Diagnoses   Final diagnoses:  Hematuria, unspecified type    ED Discharge Orders        Ordered    ciprofloxacin (CIPRO) 500 MG tablet  2 times daily     03/21/18 2217       Nat Christen, MD 03/22/18 1420

## 2018-03-23 LAB — URINE CULTURE

## 2018-04-28 ENCOUNTER — Emergency Department (HOSPITAL_COMMUNITY)
Admission: EM | Admit: 2018-04-28 | Discharge: 2018-04-28 | Disposition: A | Discharge status: Home / Self Care | Payer: Medicare Other | Attending: Emergency Medicine | Admitting: Emergency Medicine

## 2018-04-28 ENCOUNTER — Other Ambulatory Visit: Payer: Self-pay

## 2018-04-28 ENCOUNTER — Encounter (HOSPITAL_COMMUNITY): Payer: Self-pay | Admitting: *Deleted

## 2018-04-28 DIAGNOSIS — I10 Essential (primary) hypertension: Secondary | ICD-10-CM | POA: Insufficient documentation

## 2018-04-28 DIAGNOSIS — R310 Gross hematuria: Secondary | ICD-10-CM | POA: Insufficient documentation

## 2018-04-28 DIAGNOSIS — E039 Hypothyroidism, unspecified: Secondary | ICD-10-CM | POA: Diagnosis not present

## 2018-04-28 DIAGNOSIS — Z794 Long term (current) use of insulin: Secondary | ICD-10-CM | POA: Insufficient documentation

## 2018-04-28 DIAGNOSIS — Z87891 Personal history of nicotine dependence: Secondary | ICD-10-CM | POA: Diagnosis not present

## 2018-04-28 DIAGNOSIS — Z79899 Other long term (current) drug therapy: Secondary | ICD-10-CM | POA: Insufficient documentation

## 2018-04-28 DIAGNOSIS — R339 Retention of urine, unspecified: Secondary | ICD-10-CM | POA: Diagnosis not present

## 2018-04-28 DIAGNOSIS — Z7982 Long term (current) use of aspirin: Secondary | ICD-10-CM | POA: Insufficient documentation

## 2018-04-28 LAB — URINALYSIS, ROUTINE W REFLEX MICROSCOPIC
Bilirubin Urine: NEGATIVE
Glucose, UA: >=500 mg/dL — AB
Ketones, ur: NEGATIVE mg/dL
Leukocytes, UA: NEGATIVE
Nitrite: NEGATIVE
Protein, ur: 100 mg/dL — AB
RBC / HPF: >50 RBC/hpf — ABNORMAL HIGH (ref 0–5)
Specific Gravity, Urine: 1.005 (ref 1.005–1.030)
pH: 6 (ref 5.0–8.0)

## 2018-04-28 NOTE — ED Notes (Signed)
Educated pt and wife on catheter care and how to irrigate. Supplies provided.

## 2018-04-28 NOTE — ED Triage Notes (Signed)
Pt states that he had a cystoscopy performed last week, pt states that he was experiencing blood clots intermittently but today has had urinary retention and will "drip blood"

## 2018-04-28 NOTE — ED Notes (Signed)
Irrigated foley with 72ml. Foley currently draining again. 600 ml currently.

## 2018-04-28 NOTE — Discharge Instructions (Signed)
Call your urologist on Monday.

## 2018-04-28 NOTE — ED Notes (Signed)
Size 16 foley attempt failed. Called AC for 18 fr

## 2018-04-28 NOTE — ED Notes (Signed)
Patient given Dt Coke to drink at this time

## 2018-04-28 NOTE — ED Notes (Signed)
Called into pt room regarding foley leaking again. EDP notified and in room now .

## 2018-04-30 NOTE — ED Provider Notes (Signed)
[facility] EMERGENCY DEPARTMENT Provider Note   CSN: [id number] Arrival date & time: 04/28/18  1919     History   Chief Complaint Chief Complaint  Patient presents with  . Hematuria    HPI Jeffery [name] is a 67 y.o. male.  HPI  67 year old male with urinary retention and gross hematuria/clots.  Patient is status post cystoscopy with bladder biopsy and fulguration on 5/24.  He reports that after few days his urine was clearing.  Today though he began noticing gross hematuria and was passing some large clots.  He subsequently stopped being able to void.  He is currently only dribbling small amounts.  He has had increasing suprapubic pain/pressure and is now very uncomfortable.  Past Medical History:  Diagnosis Date  . Cancer [facility])    prostate  . Cervical spondylosis   . Glaucoma   . History of migraine headaches   . Hx of arthroscopic knee surgery    Left knee  . Hyperlipidemia   . Hypertension   . Impaired fasting glucose   . Memory loss   . Progressive gait disorder   . Varicose veins     Patient Active Problem List   Diagnosis Date Noted  . Mild cognitive impairment 12/13/2017  . AKI (acute kidney injury) ([place]) 02/16/2017  . Hyponatremia 02/16/2017  . Hypokalemia 02/15/2017  . Syncope and collapse 12/07/2012  . Hyperthyroidism 12/07/2012  . Other malaise and fatigue 12/07/2012  . Disturbance of skin sensation 12/07/2012  . Cervical spondylosis without myelopathy 12/07/2012  . Abnormality of gait 12/07/2012  . 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 CHOLECYSTECTOMY    . EYE SURGERY    . PROSTATE SURGERY          Home Medications    Prior to Admission medications   Medication Sig Start Date End Date Taking? Authorizing Provider  amLODipine (NORVASC) 10 MG tablet Take 1 tablet (10 mg total) by mouth daily. 05/07/16  Yes Ward, Delice Bison, DO  aspirin EC 81 MG tablet  Take 81 mg by mouth daily.   Yes [provider]  donepezil (ARICEPT) 10 MG tablet Take 1 tablet (10 mg total) by mouth at bedtime. 12/13/17  Yes Marcial Pacas, MD  glimepiride (AMARYL) 2 MG tablet Take 2 mg by mouth daily with breakfast.    Yes [provider]  Insulin Detemir (LEVEMIR FLEXTOUCH) 100 UNIT/ML Pen Inject 20 Units into the skin every morning.   Yes [provider]  memantine (NAMENDA) 10 MG tablet Take 1 tablet (10 mg total) by mouth 2 (two) times daily. 12/13/17  Yes Marcial Pacas, MD  oxyCODONE-acetaminophen (PERCOCET/ROXICET) 5-325 MG tablet TAKE 1 TABLET BY MOUTH EVERY 4 TO 6 HOURS AS NEEDED 04/21/18  Yes [provider]  Potassium Chloride (KLOR-CON PO) Take 10 mEq by mouth 2 (two) times daily.    Yes [provider]  simvastatin (ZOCOR) 20 MG tablet Take 20 mg by mouth at bedtime.   Yes [provider]  ciprofloxacin (CIPRO) 500 MG tablet Take 1 tablet (500 mg total) by mouth 2 (two) times daily. Patient not taking: Reported on 04/28/2018 03/21/18   Nat Christen, MD    Family History Family History  Problem Relation Age of Onset  . Glaucoma Mother   . Arthritis Father   . Diabetes Father   . Fibromyalgia Sister   . Gait disorder Maternal Uncle  progessive gait disorder    Social History Social History   Tobacco Use  . Smoking status: Former Research scientist (life sciences)  . Smokeless tobacco: Never Used  Substance Use Topics  . Alcohol use: Yes    Comment: 0.5 pint of wine daily  . Drug use: No     Allergies   Penicillins; Atenolol; Clonidine derivatives; and Tape   Review of Systems Review of Systems  All systems reviewed and negative, other than as noted in HPI.  Physical Exam Updated Vital Signs BP 118/74 (BP Location: Right Arm)   Pulse (!) 59   Temp 97.8 F (36.6 C) (Oral)   Resp 20   Ht 6\' 2"  (1.88 m)   Wt 102.5 kg (226 lb)   SpO2 100%   BMI 29.02 kg/m   Physical Exam  Constitutional: He appears  well-developed and well-nourished.  Patient pacing the room when I first entered.  Appears very uncomfortable.  HENT:  Head: Normocephalic and atraumatic.  Eyes: Conjunctivae are normal. Right eye exhibits no discharge. Left eye exhibits no discharge.  Neck: Neck supple.  Cardiovascular: Normal rate, regular rhythm and normal heart sounds. Exam reveals no gallop and no friction rub.  No murmur heard. Pulmonary/Chest: Effort normal and breath sounds normal. No respiratory distress.  Abdominal: Soft. He exhibits no distension. There is tenderness.  Exquisite suprapubic tenderness with palpably enlarged bladder.  Musculoskeletal: He exhibits no edema or tenderness.  Neurological: He is alert.  Skin: Skin is warm and dry.  Psychiatric: He has a normal mood and affect. His behavior is normal. Thought content normal.  Nursing note and vitals reviewed.    ED Treatments / Results  Labs (all labs ordered are listed, but only abnormal results are displayed) Labs Reviewed  URINALYSIS, ROUTINE W REFLEX MICROSCOPIC - Abnormal; Notable for the following components:      Result Value   Color, Urine AMBER (*)    APPearance HAZY (*)    Glucose, UA >=500 (*)    Hgb urine dipstick LARGE (*)    Protein, ur 100 (*)    RBC / HPF >50 (*)    Bacteria, UA RARE (*)    All other components within normal limits    EKG None  Radiology No results found.  Procedures Procedures (including critical care time)  Medications Ordered in ED Medications - No data to display   Initial Impression / Assessment and Plan / ED Course  I have reviewed the triage vital signs and the nursing notes.  Pertinent labs & imaging results that were available during my care of the patient were reviewed by me and considered in my medical decision making (see chart for details).     67 year old male with acute urinary retention secondary to hematuria/clots.  Foley catheter was placed.  He drained over a liter from it.   He did require some irrigation though shortly after it was placed to reestablish flow.  Patient/significant other were educated on how to flush it if needed at home.  He has established urology care.  Outpatient follow-up this upcoming week.  Final Clinical Impressions(s) / ED Diagnoses   Final diagnoses:  Urinary retention  Gross hematuria    ED Discharge Orders    None       Virgel Manifold, MD 04/30/18 1949

## 2018-05-10 DIAGNOSIS — R338 Other retention of urine: Secondary | ICD-10-CM | POA: Insufficient documentation

## 2018-05-26 ENCOUNTER — Encounter (HOSPITAL_COMMUNITY): Payer: Self-pay | Admitting: Emergency Medicine

## 2018-05-26 ENCOUNTER — Other Ambulatory Visit: Payer: Self-pay

## 2018-05-26 ENCOUNTER — Emergency Department (HOSPITAL_COMMUNITY)
Admission: EM | Admit: 2018-05-26 | Discharge: 2018-05-26 | Disposition: A | Discharge status: Other Acute Inpatient Hospital | Payer: Medicare Other | Attending: Emergency Medicine | Admitting: Emergency Medicine

## 2018-05-26 DIAGNOSIS — Z8546 Personal history of malignant neoplasm of prostate: Secondary | ICD-10-CM | POA: Diagnosis not present

## 2018-05-26 DIAGNOSIS — Z79899 Other long term (current) drug therapy: Secondary | ICD-10-CM | POA: Insufficient documentation

## 2018-05-26 DIAGNOSIS — I10 Essential (primary) hypertension: Secondary | ICD-10-CM | POA: Diagnosis not present

## 2018-05-26 DIAGNOSIS — Z7982 Long term (current) use of aspirin: Secondary | ICD-10-CM | POA: Diagnosis not present

## 2018-05-26 DIAGNOSIS — R310 Gross hematuria: Secondary | ICD-10-CM

## 2018-05-26 DIAGNOSIS — D649 Anemia, unspecified: Secondary | ICD-10-CM | POA: Insufficient documentation

## 2018-05-26 DIAGNOSIS — Z87891 Personal history of nicotine dependence: Secondary | ICD-10-CM | POA: Diagnosis not present

## 2018-05-26 DIAGNOSIS — N3001 Acute cystitis with hematuria: Secondary | ICD-10-CM | POA: Insufficient documentation

## 2018-05-26 DIAGNOSIS — R339 Retention of urine, unspecified: Secondary | ICD-10-CM

## 2018-05-26 HISTORY — DX: Hematuria, unspecified: R31.9

## 2018-05-26 LAB — CBC WITH DIFFERENTIAL/PLATELET
BASOS ABS: 0 10*3/uL (ref 0.0–0.1)
BASOS PCT: 0 %
EOS ABS: 0 10*3/uL (ref 0.0–0.7)
EOS PCT: 0 %
HCT: 26.7 % — ABNORMAL LOW (ref 39.0–52.0)
Hemoglobin: 8.7 g/dL — ABNORMAL LOW (ref 13.0–17.0)
Lymphocytes Relative: 28 %
Lymphs Abs: 1.3 10*3/uL (ref 0.7–4.0)
MCH: 32.8 pg (ref 26.0–34.0)
MCHC: 32.6 g/dL (ref 30.0–36.0)
MCV: 100.8 fL — ABNORMAL HIGH (ref 78.0–100.0)
MONO ABS: 0.6 10*3/uL (ref 0.1–1.0)
MONOS PCT: 12 %
Neutro Abs: 2.8 10*3/uL (ref 1.7–7.7)
Neutrophils Relative %: 60 %
Platelets: 161 10*3/uL (ref 150–400)
RBC: 2.65 MIL/uL — ABNORMAL LOW (ref 4.22–5.81)
RDW: 14 % (ref 11.5–15.5)
WBC: 4.6 10*3/uL (ref 4.0–10.5)

## 2018-05-26 LAB — BASIC METABOLIC PANEL
Anion gap: 8 (ref 5–15)
BUN: 6 mg/dL — AB (ref 8–23)
CALCIUM: 8.6 mg/dL — AB (ref 8.9–10.3)
CO2: 23 mmol/L (ref 22–32)
CREATININE: 1.12 mg/dL (ref 0.61–1.24)
Chloride: 103 mmol/L (ref 98–111)
GFR calc Af Amer: >60 mL/min (ref >60)
GLUCOSE: 330 mg/dL — AB (ref 70–99)
POTASSIUM: 4 mmol/L (ref 3.5–5.1)
SODIUM: 134 mmol/L — AB (ref 135–145)

## 2018-05-26 LAB — URINALYSIS, MICROSCOPIC (REFLEX): RBC / HPF: >50 RBC/hpf (ref 0–5)

## 2018-05-26 LAB — LACTIC ACID, PLASMA
LACTIC ACID, VENOUS: 3 mmol/L — AB (ref 0.5–1.9)
LACTIC ACID, VENOUS: 2.4 mmol/L — AB (ref 0.5–1.9)

## 2018-05-26 LAB — URINALYSIS, ROUTINE W REFLEX MICROSCOPIC
Bilirubin Urine: NEGATIVE
KETONES UR: 15 mg/dL — AB
Nitrite: POSITIVE — AB
PH: 6.5 (ref 5.0–8.0)
Specific Gravity, Urine: 1.01 (ref 1.005–1.030)

## 2018-05-26 MED ORDER — CIPROFLOXACIN IN D5W 400 MG/200ML IV SOLN
400.0000 mg | Freq: Once | INTRAVENOUS | Status: AC
  Administered 2018-05-26: 400 mg via INTRAVENOUS
  Filled 2018-05-26: qty 200

## 2018-05-26 MED ORDER — SODIUM CHLORIDE 0.9 % IV BOLUS
1000.0000 mL | Freq: Once | INTRAVENOUS | Status: AC
  Administered 2018-05-26: 1000 mL via INTRAVENOUS

## 2018-05-26 MED ORDER — SODIUM CHLORIDE 0.9 % IV SOLN: INTRAVENOUS | Status: DC

## 2018-05-26 NOTE — ED Notes (Signed)
Call from lab  Critical lactic acid 2.4  Dr Tonye Becket informed

## 2018-05-26 NOTE — ED Triage Notes (Signed)
Pt states he has been having difficulty with urination and burning after urination. Last voided yesterday and states he is just dribbling now. Hx of prostate CA and TURP.

## 2018-05-26 NOTE — ED Notes (Signed)
Date and time results received: 05/26/18 8:05 PM (use smartphrase ".now" to insert current time)  Test: Lactic Critical Value: 3.0  Name of Provider Notified: [place]  Orders Received? Or Actions Taken?:MD notified

## 2018-05-26 NOTE — ED Notes (Signed)
Pt waiting to be transferred to [facility] ER.

## 2018-05-26 NOTE — ED Notes (Signed)
449 ml urine on bladder scanner in triage.

## 2018-05-26 NOTE — ED Provider Notes (Signed)
[facility] EMERGENCY DEPARTMENT Provider Note   CSN: [id number] Arrival date & time: 05/26/18  1555     History   Chief Complaint Chief Complaint  Patient presents with  . Dysuria    HPI Jeffery [name] is a 67 y.o. male.  HPI  Pt was seen at 1800. Per pt, c/o gradual onset and worsening of persistent hematuria and urinary retention for "years" worse since yesterday. Pt states he has been "just dribbling urine" since yesterday. Associated with mild pain in the tip of his penis after urinating. Denies testicular pain/swelling, no back/flank pain, no fevers, no rash, no abd pain, no N/V/D, no CP/SOB.   Past Medical History:  Diagnosis Date  . Cancer [facility])    prostate  . Cervical spondylosis   . Glaucoma   . History of migraine headaches   . Hx of arthroscopic knee surgery    Left knee  . Hyperlipidemia   . Hypertension   . Impaired fasting glucose   . Memory loss   . Progressive gait disorder   . Varicose veins     Patient Active Problem List   Diagnosis Date Noted  . Mild cognitive impairment 12/13/2017  . AKI (acute kidney injury) ([place]) 02/16/2017  . Hyponatremia 02/16/2017  . Hypokalemia 02/15/2017  . Syncope and collapse 12/07/2012  . Hyperthyroidism 12/07/2012  . Other malaise and fatigue 12/07/2012  . Disturbance of skin sensation 12/07/2012  . Cervical spondylosis without myelopathy 12/07/2012  . Abnormality of gait 12/07/2012  . 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 CHOLECYSTECTOMY    . EYE SURGERY    . PROSTATE SURGERY          Home Medications    Prior to Admission medications   Medication Sig Start Date End Date Taking? Authorizing Provider  amLODipine (NORVASC) 10 MG tablet Take 1 tablet (10 mg total) by mouth daily. 05/07/16  Yes Ward, Kristen N, DO  donepezil (ARICEPT) 10 MG tablet Take 1 tablet (10 mg total) by mouth at bedtime. 12/13/17  Yes Marcial Pacas,  MD  glimepiride (AMARYL) 2 MG tablet Take 2 mg by mouth daily with breakfast.    Yes [provider]  Insulin Detemir (LEVEMIR FLEXTOUCH) 100 UNIT/ML Pen Inject 20 Units into the skin every morning.   Yes [provider]  memantine (NAMENDA) 10 MG tablet Take 1 tablet (10 mg total) by mouth 2 (two) times daily. 12/13/17  Yes Marcial Pacas, MD  Potassium Chloride (KLOR-CON PO) Take 10 mEq by mouth 2 (two) times daily.    Yes [provider]  simvastatin (ZOCOR) 20 MG tablet Take 20 mg by mouth at bedtime.   Yes [provider]  aspirin EC 81 MG tablet Take 81 mg by mouth daily.    [provider]  ciprofloxacin (CIPRO) 500 MG tablet Take 1 tablet (500 mg total) by mouth 2 (two) times daily. Patient not taking: Reported on 04/28/2018 03/21/18   Nat Christen, MD  oxyCODONE-acetaminophen (PERCOCET/ROXICET) 5-325 MG tablet TAKE 1 TABLET BY MOUTH EVERY 4 TO 6 HOURS AS NEEDED 04/21/18   [provider]    Family History Family History  Problem Relation Age of Onset  . Glaucoma Mother   . Arthritis Father   . Diabetes Father   . Fibromyalgia Sister   . Gait disorder Maternal Uncle        progessive gait disorder    Social History Social  History   Tobacco Use  . Smoking status: Former Research scientist (life sciences)  . Smokeless tobacco: Never Used  Substance Use Topics  . Alcohol use: Yes    Comment: 0.5 pint of wine daily  . Drug use: No     Allergies   Penicillins; Atenolol; Clonidine derivatives; Metformin and related; and Tape   Review of Systems Review of Systems ROS: Statement: All systems negative except as marked or noted in the HPI; Constitutional: Negative for fever and chills. ; ; Eyes: Negative for eye pain, redness and discharge. ; ; ENMT: Negative for ear pain, hoarseness, nasal congestion, sinus pressure and sore throat. ; ; Cardiovascular: Negative for chest pain, palpitations, diaphoresis, dyspnea and peripheral edema. ; ; Respiratory: Negative  for cough, wheezing and stridor. ; ; Gastrointestinal: Negative for nausea, vomiting, diarrhea, abdominal pain, blood in stool, hematemesis, jaundice and rectal bleeding. . ; ; Genitourinary: Negative for flank pain and +urinary retention, hematuria, dysuria. ; ; Genital:  No penile drainage or rash, no testicular pain or swelling, no scrotal rash or swelling. ;; Musculoskeletal: Negative for back pain and neck pain. Negative for swelling and trauma.; ; Skin: Negative for pruritus, rash, abrasions, blisters, bruising and skin lesion.; ; Neuro: Negative for headache, lightheadedness and neck stiffness. Negative for weakness, altered level of consciousness, altered mental status, extremity weakness, paresthesias, involuntary movement, seizure and syncope.       Physical Exam Updated Vital Signs BP 115/68   Pulse 69   Temp 98.1 F (36.7 C) (Oral)   Resp 16   Ht 6\' 2"  (1.88 m)   Wt 102.5 kg (226 lb)   SpO2 98%   BMI 29.02 kg/m   Physical Exam 1805: Physical examination:  Nursing notes reviewed; Vital signs and O2 SAT reviewed;  Constitutional: Well developed, Well nourished, Well hydrated, In no acute distress; Head:  Normocephalic, atraumatic; Eyes: EOMI, PERRL, No scleral icterus; ENMT: Mouth and pharynx normal, Mucous membranes moist; Neck: Supple, Full range of motion, No lymphadenopathy; Cardiovascular: Regular rate and rhythm, No gallop; Respiratory: Breath sounds clear & equal bilaterally, No wheezes.  Speaking full sentences with ease, Normal respiratory effort/excursion; Chest: Nontender, Movement normal; Abdomen: Soft, Nontender, Nondistended, Normal bowel sounds; Genitourinary: No CVA tenderness; Extremities: Peripheral pulses normal, No tenderness, No edema, No calf edema or asymmetry.; Neuro: AA&Ox3, Major CN grossly intact.  Speech clear. No gross focal motor or sensory deficits in extremities.; Skin: Color normal, Warm, Dry.   ED Treatments / Results  Labs (all labs ordered are  listed, but only abnormal results are displayed)   EKG None  Radiology   Procedures Procedures (including critical care time)  Medications Ordered in ED Medications - No data to display   Initial Impression / Assessment and Plan / ED Course  I have reviewed the triage vital signs and the nursing notes.  Pertinent labs & imaging results that were available during my care of the patient were reviewed by me and considered in my medical decision making (see chart for details).  MDM Reviewed: previous chart, nursing note and vitals Reviewed previous: labs Interpretation: labs   Results for orders placed or performed during the hospital encounter of 05/26/18  Urinalysis, Routine w reflex microscopic- may I&O cath if menses  Result Value Ref Range   Color, Urine RED (A) YELLOW   APPearance HAZY (A) CLEAR   Specific Gravity, Urine 1.010 1.005 - 1.030   pH 6.5 5.0 - 8.0   Glucose, UA >=500 (A) NEGATIVE mg/dL   Hgb urine  dipstick LARGE (A) NEGATIVE   Bilirubin Urine NEGATIVE NEGATIVE   Ketones, ur 15 (A) NEGATIVE mg/dL   Protein, ur >300 (A) NEGATIVE mg/dL   Nitrite POSITIVE (A) NEGATIVE   Leukocytes, UA MODERATE (A) NEGATIVE  CBC with Differential  Result Value Ref Range   WBC 4.6 4.0 - 10.5 K/uL   RBC 2.65 (L) 4.22 - 5.81 MIL/uL   Hemoglobin 8.7 (L) 13.0 - 17.0 g/dL   HCT 26.7 (L) 39.0 - 52.0 %   MCV 100.8 (H) 78.0 - 100.0 fL   MCH 32.8 26.0 - 34.0 pg   MCHC 32.6 30.0 - 36.0 g/dL   RDW 14.0 11.5 - 15.5 %   Platelets 161 150 - 400 K/uL   Neutrophils Relative % 60 %   Neutro Abs 2.8 1.7 - 7.7 K/uL   Lymphocytes Relative 28 %   Lymphs Abs 1.3 0.7 - 4.0 K/uL   Monocytes Relative 12 %   Monocytes Absolute 0.6 0.1 - 1.0 K/uL   Eosinophils Relative 0 %   Eosinophils Absolute 0.0 0.0 - 0.7 K/uL   Basophils Relative 0 %   Basophils Absolute 0.0 0.0 - 0.1 K/uL  Lactic acid, plasma  Result Value Ref Range   Lactic Acid, Venous 3.0 (HH) 0.5 - 1.9 mmol/L  Basic metabolic  panel  Result Value Ref Range   Sodium 134 (L) 135 - 145 mmol/L   Potassium 4.0 3.5 - 5.1 mmol/L   Chloride 103 98 - 111 mmol/L   CO2 23 22 - 32 mmol/L   Glucose, Bld 330 (H) 70 - 99 mg/dL   BUN 6 (L) 8 - 23 mg/dL   Creatinine, Ser 1.12 0.61 - 1.24 mg/dL   Calcium 8.6 (L) 8.9 - 10.3 mg/dL   GFR calc non Af Amer >60 >60 mL/min   GFR calc Af Amer >60 >60 mL/min   Anion gap 8 5 - 15  Urinalysis, Microscopic (reflex)  Result Value Ref Range   RBC / HPF >50 0 - 5 RBC/hpf   WBC, UA 6-10 0 - 5 WBC/hpf   Bacteria, UA FEW (A) NONE SEEN   Squamous Epithelial / LPF 0-5 0 - 5    Results for MATTHEW, CINA (MRN [id number]) as of 05/26/2018 20:09  Ref. Range 02/15/2017 17:47 02/16/2017 10:37 11/20/2017 16:33 03/21/2018 19:11 05/26/2018 19:00  Hemoglobin Latest Ref Range: 13.0 - 17.0 g/dL 16.1 13.8 11.7 (L) 13.8 8.7 (L)  HCT Latest Ref Range: 39.0 - 52.0 % 43.5 38.0 (L) 34.8 (L) 41.5 26.7 (L)    1815:  Bladder scan with 434ml urine. Foley placed, +hematuria. Pt states he is starting to feel better as his bladder drains. Will continue to monitor.   2110:  Gross hematuria with clots from foley. Foley has occasionally stopped draining and ED RN irrigated the clots free. IVF bolus given for elevated lactic acid. Will start IV cipro for UTI while UC pending.  T/C returned from U[facility] Dr. Diona Fanti, case discussed, including:  HPI, pertinent PM/SHx, VS/PE, dx testing, ED course and treatment: pt may need Uro intervention to stop bleeding, given lack of Uro services on weekend at [facility], transfer pt to his Uro MD at [facility].  2055:  T/C returned from [facility] Dr. Felipa Eth, case discussed, including:  HPI, pertinent PM/SHx, VS/PE, dx testing, ED course and treatment:  Requests to transfer pt to [facility] ED for his evaluation and likely admission to their Hospitalist service. T/C returned from [place] Dr. Nelva Bush, case discussed, including:  HPI,  pertinent PM/SHx, VS/PE, dx testing, ED course and treatment:  Agreeable to  accept transfer for Uro MD evaluation.     Final Clinical Impressions(s) / ED Diagnoses   Final diagnoses:  None    ED Discharge Orders    None       Francine Graven, DO 05/27/18 1355

## 2018-05-26 NOTE — ED Notes (Signed)
469mL on bladder scanner.

## 2018-05-27 DIAGNOSIS — D500 Iron deficiency anemia secondary to blood loss (chronic): Secondary | ICD-10-CM | POA: Insufficient documentation

## 2018-05-27 DIAGNOSIS — N029 Recurrent and persistent hematuria with unspecified morphologic changes: Secondary | ICD-10-CM | POA: Insufficient documentation

## 2018-05-27 DIAGNOSIS — Z8546 Personal history of malignant neoplasm of prostate: Secondary | ICD-10-CM | POA: Insufficient documentation

## 2018-05-27 LAB — URINE CULTURE: Culture: NO GROWTH

## 2018-05-27 MED ORDER — GLUCOSE 40 % PO GEL
15.00 g | ORAL | Status: DC
Start: ? — End: 2018-05-27

## 2018-05-27 MED ORDER — OXYCODONE-ACETAMINOPHEN 5-325 MG PO TABS
1.00 | ORAL_TABLET | ORAL | Status: DC
Start: ? — End: 2018-05-27

## 2018-05-27 MED ORDER — SODIUM CHLORIDE 0.9 % IV SOLN
INTRAVENOUS | Status: DC
Start: ? — End: 2018-05-27

## 2018-05-27 MED ORDER — DONEPEZIL HCL 10 MG PO TABS
10.00 | ORAL_TABLET | ORAL | Status: DC
Start: 2018-05-27 — End: 2018-05-27

## 2018-05-27 MED ORDER — GENERIC EXTERNAL MEDICATION
1.00 | Status: DC
Start: ? — End: 2018-05-27

## 2018-05-27 MED ORDER — LEVOFLOXACIN IN D5W 500 MG/100ML IV SOLN
500.00 | INTRAVENOUS | Status: DC
Start: 2018-05-28 — End: 2018-05-27

## 2018-05-27 MED ORDER — ATORVASTATIN CALCIUM 10 MG PO TABS
10.00 | ORAL_TABLET | ORAL | Status: DC
Start: 2018-05-27 — End: 2018-05-27

## 2018-05-27 MED ORDER — DEXTROSE 50 % IV SOLN
12.00 g | INTRAVENOUS | Status: DC
Start: ? — End: 2018-05-27

## 2018-05-27 MED ORDER — BELLADONNA ALKALOIDS-OPIUM 16.2-60 MG RE SUPP
1.00 | RECTAL | Status: DC
Start: ? — End: 2018-05-27

## 2018-05-27 MED ORDER — INSULIN LISPRO 100 UNIT/ML ~~LOC~~ SOLN
2.00 | SUBCUTANEOUS | Status: DC
Start: 2018-05-27 — End: 2018-05-27

## 2018-05-27 MED ORDER — GENERIC EXTERNAL MEDICATION
20.00 | Status: DC
Start: 2018-05-28 — End: 2018-05-27

## 2018-05-27 MED ORDER — AMLODIPINE BESYLATE 5 MG PO TABS
10.00 | ORAL_TABLET | ORAL | Status: DC
Start: 2018-05-28 — End: 2018-05-27

## 2018-05-27 MED ORDER — ONDANSETRON HCL 4 MG/2ML IJ SOLN
4.00 | INTRAMUSCULAR | Status: DC
Start: ? — End: 2018-05-27

## 2018-05-27 MED ORDER — MEMANTINE HCL 10 MG PO TABS
10.00 | ORAL_TABLET | ORAL | Status: DC
Start: 2018-05-27 — End: 2018-05-27

## 2018-05-27 MED ORDER — ACETAMINOPHEN 325 MG PO TABS
650.00 | ORAL_TABLET | ORAL | Status: DC
Start: ? — End: 2018-05-27

## 2018-06-14 ENCOUNTER — Emergency Department (HOSPITAL_COMMUNITY)
Admission: EM | Admit: 2018-06-14 | Discharge: 2018-06-14 | Disposition: A | Discharge status: Home / Self Care | Payer: Medicare Other | Attending: Emergency Medicine | Admitting: Emergency Medicine

## 2018-06-14 ENCOUNTER — Encounter (HOSPITAL_COMMUNITY): Payer: Self-pay | Admitting: Emergency Medicine

## 2018-06-14 ENCOUNTER — Other Ambulatory Visit: Payer: Self-pay

## 2018-06-14 DIAGNOSIS — R319 Hematuria, unspecified: Secondary | ICD-10-CM | POA: Diagnosis present

## 2018-06-14 DIAGNOSIS — Z8546 Personal history of malignant neoplasm of prostate: Secondary | ICD-10-CM | POA: Diagnosis not present

## 2018-06-14 DIAGNOSIS — Z87891 Personal history of nicotine dependence: Secondary | ICD-10-CM | POA: Insufficient documentation

## 2018-06-14 DIAGNOSIS — Z79899 Other long term (current) drug therapy: Secondary | ICD-10-CM | POA: Insufficient documentation

## 2018-06-14 DIAGNOSIS — R310 Gross hematuria: Secondary | ICD-10-CM | POA: Diagnosis not present

## 2018-06-14 DIAGNOSIS — I10 Essential (primary) hypertension: Secondary | ICD-10-CM | POA: Insufficient documentation

## 2018-06-14 DIAGNOSIS — R339 Retention of urine, unspecified: Secondary | ICD-10-CM | POA: Diagnosis not present

## 2018-06-14 DIAGNOSIS — Z7982 Long term (current) use of aspirin: Secondary | ICD-10-CM | POA: Diagnosis not present

## 2018-06-14 LAB — BASIC METABOLIC PANEL
Anion gap: 9 (ref 5–15)
BUN: 9 mg/dL (ref 8–23)
CALCIUM: 8.5 mg/dL — AB (ref 8.9–10.3)
CHLORIDE: 106 mmol/L (ref 98–111)
CO2: 21 mmol/L — AB (ref 22–32)
CREATININE: 1.14 mg/dL (ref 0.61–1.24)
GFR calc Af Amer: >60 mL/min (ref >60)
GFR calc non Af Amer: >60 mL/min (ref >60)
GLUCOSE: 414 mg/dL — AB (ref 70–99)
Potassium: 4.2 mmol/L (ref 3.5–5.1)
Sodium: 136 mmol/L (ref 135–145)

## 2018-06-14 LAB — URINALYSIS, ROUTINE W REFLEX MICROSCOPIC
Bilirubin Urine: NEGATIVE
Nitrite: POSITIVE — AB
PH: 6.5 (ref 5.0–8.0)
Specific Gravity, Urine: 1.02 (ref 1.005–1.030)

## 2018-06-14 LAB — CBC WITH DIFFERENTIAL/PLATELET
BASOS PCT: 1 %
Basophils Absolute: 0 10*3/uL (ref 0.0–0.1)
Eosinophils Absolute: 0 10*3/uL (ref 0.0–0.7)
Eosinophils Relative: 1 %
HEMATOCRIT: 24 % — AB (ref 39.0–52.0)
Hemoglobin: 7.4 g/dL — ABNORMAL LOW (ref 13.0–17.0)
LYMPHS ABS: 0.6 10*3/uL — AB (ref 0.7–4.0)
Lymphocytes Relative: 14 %
MCH: 28.5 pg (ref 26.0–34.0)
MCHC: 30.8 g/dL (ref 30.0–36.0)
MCV: 92.3 fL (ref 78.0–100.0)
MONOS PCT: 12 %
Monocytes Absolute: 0.5 10*3/uL (ref 0.1–1.0)
Neutro Abs: 3 10*3/uL (ref 1.7–7.7)
Neutrophils Relative %: 72 %
Platelets: 154 10*3/uL (ref 150–400)
RBC: 2.6 MIL/uL — ABNORMAL LOW (ref 4.22–5.81)
RDW: 17.2 % — AB (ref 11.5–15.5)
WBC: 4.1 10*3/uL (ref 4.0–10.5)

## 2018-06-14 LAB — URINALYSIS, MICROSCOPIC (REFLEX)
RBC / HPF: >50 RBC/hpf (ref 0–5)
Squamous Epithelial / LPF: NONE SEEN (ref 0–5)

## 2018-06-14 LAB — CBG MONITORING, ED: Glucose-Capillary: 360 mg/dL — ABNORMAL HIGH (ref 70–99)

## 2018-06-14 MED ORDER — INSULIN ASPART 100 UNIT/ML ~~LOC~~ SOLN
5.0000 [IU] | Freq: Once | SUBCUTANEOUS | Status: AC
  Administered 2018-06-14: 5 [IU] via SUBCUTANEOUS
  Filled 2018-06-14: qty 1

## 2018-06-14 MED ORDER — SODIUM CHLORIDE 0.45 % IV SOLN
INTRAVENOUS | Status: DC
Start: ? — End: 2018-06-14

## 2018-06-14 MED ORDER — LEVOFLOXACIN IN D5W 500 MG/100ML IV SOLN
500.0000 mg | Freq: Once | INTRAVENOUS | Status: AC
  Administered 2018-06-14: 500 mg via INTRAVENOUS
  Filled 2018-06-14: qty 100

## 2018-06-14 MED ORDER — LEVOFLOXACIN 500 MG PO TABS: 500.0000 mg | ORAL_TABLET | Freq: Every day | ORAL | 0 refills | Status: DC

## 2018-06-14 MED ORDER — BELLADONNA ALKALOIDS-OPIUM 16.2-60 MG RE SUPP
1.00 | RECTAL | Status: DC
Start: ? — End: 2018-06-14

## 2018-06-14 MED ORDER — DONEPEZIL HCL 10 MG PO TABS
10.00 | ORAL_TABLET | ORAL | Status: DC
Start: 2018-06-14 — End: 2018-06-14

## 2018-06-14 MED ORDER — AMLODIPINE BESYLATE 5 MG PO TABS
10.00 | ORAL_TABLET | ORAL | Status: DC
Start: 2018-06-15 — End: 2018-06-14

## 2018-06-14 MED ORDER — MEMANTINE HCL 10 MG PO TABS
10.00 | ORAL_TABLET | ORAL | Status: DC
Start: 2018-06-14 — End: 2018-06-14

## 2018-06-14 MED ORDER — DEXTROSE 50 % IV SOLN
12.00 g | INTRAVENOUS | Status: DC
Start: ? — End: 2018-06-14

## 2018-06-14 MED ORDER — GLIPIZIDE ER 5 MG PO TB24
5.00 | ORAL_TABLET | ORAL | Status: DC
Start: 2018-06-15 — End: 2018-06-14

## 2018-06-14 MED ORDER — HYDROCODONE-ACETAMINOPHEN 5-325 MG PO TABS
1.00 | ORAL_TABLET | ORAL | Status: DC
Start: ? — End: 2018-06-14

## 2018-06-14 MED ORDER — INSULIN LISPRO 100 UNIT/ML ~~LOC~~ SOLN
2.00 | SUBCUTANEOUS | Status: DC
Start: 2018-06-15 — End: 2018-06-14

## 2018-06-14 MED ORDER — GLUCOSE 40 % PO GEL
15.00 g | ORAL | Status: DC
Start: ? — End: 2018-06-14

## 2018-06-14 MED ORDER — SENNOSIDES-DOCUSATE SODIUM 8.6-50 MG PO TABS
1.00 | ORAL_TABLET | ORAL | Status: DC
Start: ? — End: 2018-06-14

## 2018-06-14 MED ORDER — SODIUM CHLORIDE 0.9 % IV BOLUS
500.0000 mL | Freq: Once | INTRAVENOUS | Status: AC
  Administered 2018-06-14: 500 mL via INTRAVENOUS

## 2018-06-14 MED ORDER — ONDANSETRON HCL 4 MG/2ML IJ SOLN
4.00 | INTRAMUSCULAR | Status: DC
Start: ? — End: 2018-06-14

## 2018-06-14 MED ORDER — LEVOFLOXACIN IN D5W 500 MG/100ML IV SOLN
500.00 | INTRAVENOUS | Status: DC
Start: 2018-06-15 — End: 2018-06-14

## 2018-06-14 MED ORDER — GENERIC EXTERNAL MEDICATION
1.00 | Status: DC
Start: ? — End: 2018-06-14

## 2018-06-14 NOTE — ED Triage Notes (Signed)
Pt c/o hematuria with clots.

## 2018-06-14 NOTE — Discharge Instructions (Addendum)
Keep the catheter in place and call Dr. Nevada Crane for a follow-up appointment today.  If this is not possible keep your appointment tomorrow afternoon.  Take the antibiotics as prescribed.  Your blood count is low but stable to your values from the beginning of June.  Return to the ED if you develop dizziness, lightheadedness, chest pain, shortness of breath or any other concerns.

## 2018-06-14 NOTE — ED Provider Notes (Signed)
[facility] EMERGENCY DEPARTMENT Provider Note   CSN: [id number] Arrival date & time: 06/14/18  0426     History   Chief Complaint Chief Complaint  Patient presents with  . Hematuria    HPI Jeffery [name] is a 67 y.o. male.  Patient complains of gradual onset of difficulty with urination and hematuria.  States he has not been able to urinate for the past hour other than a few drops of urine and blood.  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  He was seen in the ED on June 28 with similar symptoms and transferred to see his urologist Dr. Nevada Crane at [facility].  He did have a cystoscopy without an identified source of the bleeding and was sent home with the catheter removed.  States his been doing well up until several hours ago when he had problems with urination and some bloody urine.  Denies testicular pain or swelling.  Denies any fevers, chills, nausea or vomiting.  The history is provided by the patient.  Hematuria  Associated symptoms include abdominal pain. Pertinent negatives include no chest pain and no headaches.    Past Medical History:  Diagnosis Date  . Cancer [facility])    prostate  . Cervical spondylosis   . Glaucoma   . Hematuria    radiation cystitis  . History of migraine headaches   . Hx of arthroscopic knee surgery    Left knee  . Hyperlipidemia   . Hypertension   . Impaired fasting glucose   . Memory loss   . Progressive gait disorder   . Varicose veins     Patient Active Problem List   Diagnosis Date Noted  . Mild cognitive impairment 12/13/2017  . AKI (acute kidney injury) ([place]) 02/16/2017  . Hyponatremia 02/16/2017  . Hypokalemia 02/15/2017  . Syncope and collapse 12/07/2012  . Hyperthyroidism 12/07/2012  . Other malaise and fatigue 12/07/2012  . Disturbance of skin sensation 12/07/2012  . Cervical spondylosis without myelopathy  12/07/2012  . Abnormality of gait 12/07/2012  . 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 CHOLECYSTECTOMY    . EYE SURGERY    . PROSTATE SURGERY          Home Medications    Prior to Admission medications   Medication Sig Start Date End Date Taking? Authorizing Provider  amLODipine (NORVASC) 10 MG tablet Take 1 tablet (10 mg total) by mouth daily. 05/07/16   Ward, Delice Bison, DO  aspirin EC 81 MG tablet Take 81 mg by mouth daily.    [provider]  ciprofloxacin (CIPRO) 500 MG tablet Take 1 tablet (500 mg total) by mouth 2 (two) times daily. Patient not taking: Reported on 04/28/2018 03/21/18   Nat Christen, MD  donepezil (ARICEPT) 10 MG tablet Take 1 tablet (10 mg total) by mouth at bedtime. 12/13/17   Marcial Pacas, MD  glimepiride (AMARYL) 2 MG tablet Take 2 mg by mouth daily with breakfast.     [provider]  Insulin Detemir (LEVEMIR FLEXTOUCH) 100 UNIT/ML Pen Inject 20 Units into the skin every morning.    [provider]  memantine (NAMENDA) 10 MG tablet Take 1 tablet (10 mg total) by mouth 2 (two) times daily. 12/13/17   Marcial Pacas, MD  oxyCODONE-acetaminophen (PERCOCET/ROXICET) 5-325 MG tablet TAKE 1 TABLET BY MOUTH  EVERY 4 TO 6 HOURS AS NEEDED 04/21/18   [provider]  Potassium Chloride (KLOR-CON PO) Take 10 mEq by mouth 2 (two) times daily.     [provider]  simvastatin (ZOCOR) 20 MG tablet Take 20 mg by mouth at bedtime.    [provider]    Family History Family History  Problem Relation Age of Onset  . Glaucoma Mother   . Arthritis Father   . Diabetes Father   . Fibromyalgia Sister   . Gait disorder Maternal Uncle        progessive gait disorder    Social History Social History   Tobacco Use  . Smoking status: Former Research scientist (life sciences)  . Smokeless tobacco: Never Used  Substance Use Topics  . Alcohol use: Yes    Comment: 0.5 pint of  wine daily  . Drug use: No     Allergies   Penicillins; Atenolol; Clonidine derivatives; Metformin and related; and Tape   Review of Systems Review of Systems  Constitutional: Negative for activity change, appetite change, fatigue and fever.  HENT: Negative for congestion and rhinorrhea.   Eyes: Negative for visual disturbance.  Respiratory: Negative for chest tightness.   Cardiovascular: Negative for chest pain.  Gastrointestinal: Positive for abdominal pain. Negative for nausea and vomiting.  Genitourinary: Positive for decreased urine volume, difficulty urinating and hematuria. Negative for frequency, penile swelling, scrotal swelling and testicular pain.  Musculoskeletal: Negative for arthralgias and myalgias.  Neurological: Negative for dizziness, weakness and headaches.   all other systems are negative except as noted in the HPI and PMH.     Physical Exam Updated Vital Signs BP (!) 148/92   Pulse 83   Temp 98.1 F (36.7 C)   Resp 17   Ht 6\' 2"  (1.88 m)   Wt 102.5 kg (226 lb)   SpO2 100%   BMI 29.02 kg/m   Physical Exam  Constitutional: He is oriented to person, place, and time. He appears well-developed and well-nourished. He appears distressed.  Uncomfortable, pacing around the room  HENT:  Head: Normocephalic and atraumatic.  Mouth/Throat: Oropharynx is clear and moist. No oropharyngeal exudate.  Eyes: Pupils are equal, round, and reactive to light. Conjunctivae and EOM are normal.  Neck: Normal range of motion. Neck supple.  No meningismus.  Cardiovascular: Normal rate, regular rhythm, normal heart sounds and intact distal pulses.  No murmur heard. Pulmonary/Chest: Effort normal and breath sounds normal. No respiratory distress.  Abdominal: Soft. There is tenderness. There is no rebound and no guarding.  Palpable bladder  Genitourinary:  Genitourinary Comments: No testicular tenderness  Musculoskeletal: Normal range of motion. He exhibits no edema or  tenderness.  Neurological: He is alert and oriented to person, place, and time. No cranial nerve deficit. He exhibits normal muscle tone. Coordination normal.  No ataxia on finger to nose bilaterally. No pronator drift. 5/5 strength throughout. CN 2-12 intact.Equal grip strength. Sensation intact.   Skin: Skin is warm.  Psychiatric: He has a normal mood and affect. His behavior is normal.  Nursing note and vitals reviewed.    ED Treatments / Results  Labs (all labs ordered are listed, but only abnormal results are displayed) Labs Reviewed  URINALYSIS, ROUTINE W REFLEX MICROSCOPIC - Abnormal; Notable for the following components:      Result Value   Color, Urine RED (*)    APPearance CLOUDY (*)    Glucose, UA >=500 (*)    Hgb urine dipstick LARGE (*)    Ketones,  ur TRACE (*)    Protein, ur >300 (*)    Nitrite POSITIVE (*)    Leukocytes, UA SMALL (*)    All other components within normal limits  CBC WITH DIFFERENTIAL/PLATELET - Abnormal; Notable for the following components:   RBC 2.60 (*)    Hemoglobin 7.4 (*)    HCT 24.0 (*)    RDW 17.2 (*)    Lymphs Abs 0.6 (*)    All other components within normal limits  BASIC METABOLIC PANEL - Abnormal; Notable for the following components:   CO2 21 (*)    Glucose, Bld 414 (*)    Calcium 8.5 (*)    All other components within normal limits  URINALYSIS, MICROSCOPIC (REFLEX) - Abnormal; Notable for the following components:   Bacteria, UA MANY (*)    All other components within normal limits  CBG MONITORING, ED - Abnormal; Notable for the following components:   Glucose-Capillary 360 (*)    All other components within normal limits  URINE CULTURE    EKG None  Radiology No results found.  Procedures Procedures (including critical care time)  Medications Ordered in ED Medications - No data to display   Initial Impression / Assessment and Plan / ED Course  I have reviewed the triage vital signs and the nursing  notes.  Pertinent labs & imaging results that were available during my care of the patient were reviewed by me and considered in my medical decision making (see chart for details).    Patient presents with recurrent urinary retention hematuria.  He is not anticoagulated.  Bladder scan is performed on arrival. >400 cc urine. Anticipate insertion of catheter for drainage of bladder and likely irrigation.  Discussed with Dr. Louis Meckel of urology who agrees with bladder irrigation until urine is clearing.  Recommends if urine clears he can follow-up with his regular urologist today.   Urine is clearing with irrigation.  Hemoglobin is 7.4 which is stable to previous values at [facility] at the end of June. Values were 7.2-8.6  D/w urology Dr. Felipa Eth at [facility]. He requests exchange of foley to larger 7F or 59F. He feels patient needs hyperbaric therapy as this is a recurrent problem for him. Foley exchanged. It is draining blood tinged urine without clots. It is clearing with irrigation. Patient denies pain. He has a urology appointment tomorrow at 4pm. Dr. Felipa Eth feels he can be discharged if foley draining appropriately and symptoms improve. He requests patient call today for an appointment. He does not feel that he needs continuous bladder irrigation.  Creatinine stable. Hyperglycemia without DKA, insulin given.Will treat possible UTI with levaquin. Cultures from 6/28 were negative.   Patient comfortable with discharge home and agrees to call urology today for follow up as recommended by Dr. Felipa Eth. Return precautions discussed. Final Clinical Impressions(s) / ED Diagnoses   Final diagnoses:  Gross hematuria  Urinary retention    ED Discharge Orders    None       Kandise Riehle, Annie Main, MD 06/14/18 [phone]

## 2018-06-15 LAB — URINE CULTURE: Culture: NO GROWTH

## 2018-07-03 ENCOUNTER — Encounter (HOSPITAL_COMMUNITY): Payer: Self-pay | Admitting: Emergency Medicine

## 2018-07-03 ENCOUNTER — Emergency Department (HOSPITAL_COMMUNITY)
Admission: EM | Admit: 2018-07-03 | Discharge: 2018-07-03 | Disposition: A | Discharge status: Home / Self Care | Payer: Medicare Other | Attending: Emergency Medicine | Admitting: Emergency Medicine

## 2018-07-03 ENCOUNTER — Other Ambulatory Visit: Payer: Self-pay

## 2018-07-03 DIAGNOSIS — Z8546 Personal history of malignant neoplasm of prostate: Secondary | ICD-10-CM | POA: Insufficient documentation

## 2018-07-03 DIAGNOSIS — R339 Retention of urine, unspecified: Secondary | ICD-10-CM | POA: Diagnosis present

## 2018-07-03 DIAGNOSIS — I10 Essential (primary) hypertension: Secondary | ICD-10-CM | POA: Diagnosis not present

## 2018-07-03 DIAGNOSIS — Z7982 Long term (current) use of aspirin: Secondary | ICD-10-CM | POA: Insufficient documentation

## 2018-07-03 DIAGNOSIS — Z79899 Other long term (current) drug therapy: Secondary | ICD-10-CM | POA: Diagnosis not present

## 2018-07-03 DIAGNOSIS — Z87891 Personal history of nicotine dependence: Secondary | ICD-10-CM | POA: Insufficient documentation

## 2018-07-03 DIAGNOSIS — E785 Hyperlipidemia, unspecified: Secondary | ICD-10-CM | POA: Insufficient documentation

## 2018-07-03 DIAGNOSIS — R310 Gross hematuria: Secondary | ICD-10-CM | POA: Insufficient documentation

## 2018-07-03 LAB — URINALYSIS, ROUTINE W REFLEX MICROSCOPIC
Glucose, UA: >=500 mg/dL — AB
Ketones, ur: 15 mg/dL — AB
NITRITE: POSITIVE — AB
Protein, ur: 100 mg/dL — AB
SPECIFIC GRAVITY, URINE: 1.02 (ref 1.005–1.030)
pH: 7 (ref 5.0–8.0)

## 2018-07-03 LAB — URINALYSIS, MICROSCOPIC (REFLEX): Squamous Epithelial / LPF: NONE SEEN (ref 0–5)

## 2018-07-03 MED ORDER — LEVOFLOXACIN 500 MG PO TABS: 500.0000 mg | ORAL_TABLET | Freq: Every day | ORAL | 0 refills | Status: DC

## 2018-07-03 MED ORDER — LIDOCAINE HCL URETHRAL/MUCOSAL 2 % EX GEL
CUTANEOUS | Status: AC
  Filled 2018-07-03: qty 10

## 2018-07-03 NOTE — ED Notes (Signed)
Foley catheter changed to leg bag. Patient verbalized understanding of care to leg bag.

## 2018-07-03 NOTE — Discharge Instructions (Addendum)
Take the antibiotic as directed.  You have an appointment to see Dr. Felipa Eth tomorrow (Tuesday) at 2:45 PM in his office.

## 2018-07-03 NOTE — ED Provider Notes (Signed)
[facility] EMERGENCY DEPARTMENT Provider Note   CSN: [id number] Arrival date & time: 07/03/18  8242     History   Chief Complaint Chief Complaint  Patient presents with  . Urinary Retention    HPI Jeffery [name] is a 67 y.o. male.  HPI   Jeffery [name] is a 67 y.o. male with hx of prostate CA, who presents to the Emergency Department complaining of urinary retention since evening prior to arrival with recurrent blood in his urine and passing small clots.  He states this is a recurring problem for him. 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  He states that he is waiting for an appt for hyperbaric oxygen therapy.  He states that he is able to urinate on his own, but urine amt appears decreased.  He denies burning with urination, fever, abdominal pain or pressure, vomiting or back pain   Past Medical History:  Diagnosis Date  . Cancer [facility])    prostate  . Cervical spondylosis   . Glaucoma   . Hematuria    radiation cystitis  . History of migraine headaches   . Hx of arthroscopic knee surgery    Left knee  . Hyperlipidemia   . Hypertension   . Impaired fasting glucose   . Memory loss   . Progressive gait disorder   . Varicose veins     Patient Active Problem List   Diagnosis Date Noted  . Mild cognitive impairment 12/13/2017  . AKI (acute kidney injury) ([place]) 02/16/2017  . Hyponatremia 02/16/2017  . Hypokalemia 02/15/2017  . Syncope and collapse 12/07/2012  . Hyperthyroidism 12/07/2012  . Other malaise and fatigue 12/07/2012  . Disturbance of skin sensation 12/07/2012  . Cervical spondylosis without myelopathy 12/07/2012  . Abnormality of gait 12/07/2012  . 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 CHOLECYSTECTOMY    . EYE SURGERY    . PROSTATE SURGERY       Home Medications    Prior to Admission medications   Medication Sig Start Date End  Date Taking? Authorizing Provider  amLODipine (NORVASC) 10 MG tablet Take 1 tablet (10 mg total) by mouth daily. 05/07/16  Yes Ward, Cyril Mourning N, DO  glimepiride (AMARYL) 2 MG tablet Take 2 mg by mouth daily with breakfast.    Yes [provider]  Insulin Detemir (LEVEMIR FLEXTOUCH) 100 UNIT/ML Pen Inject 20 Units into the skin every morning.   Yes [provider]  Potassium Chloride (KLOR-CON PO) Take 10 mEq by mouth 2 (two) times daily.    Yes [provider]  simvastatin (ZOCOR) 20 MG tablet Take 20 mg by mouth at bedtime.   Yes [provider]  aspirin EC 81 MG tablet Take 81 mg by mouth daily.    [provider]  ciprofloxacin (CIPRO) 500 MG tablet Take 1 tablet (500 mg total) by mouth 2 (two) times daily. Patient not taking: Reported on 04/28/2018 03/21/18   Nat Christen, MD  donepezil (ARICEPT) 10 MG tablet Take 1 tablet (10 mg total) by mouth at bedtime. 12/13/17   Marcial Pacas, MD  levofloxacin (LEVAQUIN) 500 MG tablet Take 1 tablet (500 mg total) by mouth daily. 06/14/18   Rancour, Annie Main, MD  memantine (NAMENDA) 10 MG tablet Take 1 tablet (10 mg total) by mouth 2 (two) times daily. 12/13/17   Marcial Pacas, MD  oxyCODONE-acetaminophen (PERCOCET/ROXICET) 5-325 MG tablet TAKE  1 TABLET BY MOUTH EVERY 4 TO 6 HOURS AS NEEDED 04/21/18   [provider]    Family History Family History  Problem Relation Age of Onset  . Glaucoma Mother   . Arthritis Father   . Diabetes Father   . Fibromyalgia Sister   . Gait disorder Maternal Uncle        progessive gait disorder    Social History Social History   Tobacco Use  . Smoking status: Former Research scientist (life sciences)  . Smokeless tobacco: Never Used  Substance Use Topics  . Alcohol use: Yes    Comment: 0.5 pint of wine daily  . Drug use: No     Allergies   Penicillins; Atenolol; Clonidine derivatives; Metformin and related; and Tape   Review of Systems Review of Systems  Constitutional: Negative for  activity change, appetite change, chills and fever.  Respiratory: Negative for chest tightness and shortness of breath.   Gastrointestinal: Negative for abdominal pain, nausea and vomiting.  Genitourinary: Positive for decreased urine volume, difficulty urinating and hematuria. Negative for discharge, dysuria, flank pain, frequency, scrotal swelling, testicular pain and urgency.  Musculoskeletal: Negative for back pain.  Skin: Negative for rash.  Neurological: Negative for dizziness, weakness and numbness.  Hematological: Negative for adenopathy.  Psychiatric/Behavioral: Negative for confusion.  All other systems reviewed and are negative.    Physical Exam Updated Vital Signs BP (!) 142/68 (BP Location: Left Arm)   Pulse 88   Temp 98.6 F (37 C) (Oral)   Resp 16   Ht 6\' 2"  (1.88 m)   Wt 102.5 kg (226 lb)   SpO2 98%   BMI 29.02 kg/m   Physical Exam  Constitutional: He appears well-developed. No distress.  HENT:  Mouth/Throat: Oropharynx is clear and moist.  Cardiovascular: Normal rate, regular rhythm and intact distal pulses.  Pulmonary/Chest: Effort normal and breath sounds normal.  Abdominal: Soft. He exhibits no distension. There is no tenderness. There is no guarding.  Musculoskeletal: Normal range of motion.  Neurological: He is alert. No sensory deficit.  Skin: Skin is warm. Capillary refill takes less than 2 seconds. No rash noted.  Psychiatric: He has a normal mood and affect.  Nursing note and vitals reviewed.    ED Treatments / Results  Labs (all labs ordered are listed, but only abnormal results are displayed) Labs Reviewed  URINALYSIS, ROUTINE W REFLEX MICROSCOPIC - Abnormal; Notable for the following components:      Result Value   Color, Urine RED (*)    APPearance CLOUDY (*)    Glucose, UA >=500 (*)    Hgb urine dipstick LARGE (*)    Bilirubin Urine SMALL (*)    Ketones, ur 15 (*)    Protein, ur 100 (*)    Nitrite POSITIVE (*)    Leukocytes, UA  TRACE (*)    All other components within normal limits  URINALYSIS, MICROSCOPIC (REFLEX) - Abnormal; Notable for the following components:   Bacteria, UA MANY (*)    All other components within normal limits  URINE CULTURE    EKG None  Radiology No results found.  Procedures Procedures (including critical care time)  Medications Ordered in ED Medications - No data to display   Initial Impression / Assessment and Plan / ED Course  I have reviewed the triage vital signs and the nursing notes.  Pertinent labs & imaging results that were available during my care of the patient were reviewed by me and considered in my medical decision making (see  chart for details).    Patient with recurrent gross hematuria with intermittent clots.  Is not anticoagulated. Bladder scan showed 225 mL's of urine on arrival. Foley was placed and bladder was irrigated and urine is clearing.  Patient is well-appearing, tolerating fluids.  Does not appear uncomfortable.  Abdomen is non-tender and non- distended. Vitals reviewed.  He has been afebrile  Has urinated small amounts on his own.   Urinalysis shows large hemoglobin with positive nitrites and trace leukocytes with many bacteria, urine culture is pending.  Patient was placed on Levaquin on his previous ER visit from 06/14/2018 urine culture from that time showed no growth.  [place] patient's urologist, Dr. Felipa Eth at [facility], he is currently in surgery, voicemail left.    33 Spoke with Dr. Carlyle Lipa nurse.  Will speak with provider and call back.  Pt resting comfortably, still urinating on his own 1155  Nurse called back after speaking with Dr. Estill Dooms, requested to have pt on abx and place 20-22 g foley catheter.  Dr. Felipa Eth will see in his office tomorrow (Tuesday) at 2:45 pm.    Final Clinical Impressions(s) / ED Diagnoses   Final diagnoses:  Gross hematuria    ED Discharge Orders    None       Kem Parkinson, PA-C 07/05/18  1952    Elnora Morrison, MD 07/11/18 0111

## 2018-07-03 NOTE — ED Triage Notes (Signed)
Urinary retention since last night.  History of urinary retention.

## 2018-07-04 LAB — URINE CULTURE

## 2018-07-18 ENCOUNTER — Encounter (HOSPITAL_BASED_OUTPATIENT_CLINIC_OR_DEPARTMENT_OTHER): Payer: Medicare Other | Attending: Internal Medicine

## 2018-07-18 DIAGNOSIS — E119 Type 2 diabetes mellitus without complications: Secondary | ICD-10-CM | POA: Diagnosis not present

## 2018-07-18 DIAGNOSIS — N3041 Irradiation cystitis with hematuria: Secondary | ICD-10-CM | POA: Diagnosis not present

## 2018-07-18 DIAGNOSIS — Z794 Long term (current) use of insulin: Secondary | ICD-10-CM | POA: Insufficient documentation

## 2018-07-18 DIAGNOSIS — Z8546 Personal history of malignant neoplasm of prostate: Secondary | ICD-10-CM | POA: Insufficient documentation

## 2018-07-18 DIAGNOSIS — I10 Essential (primary) hypertension: Secondary | ICD-10-CM | POA: Insufficient documentation

## 2018-07-18 DIAGNOSIS — Y842 Radiological procedure and radiotherapy as the cause of abnormal reaction of the patient, or of later complication, without mention of misadventure at the time of the procedure: Secondary | ICD-10-CM | POA: Diagnosis not present

## 2018-07-24 DIAGNOSIS — N3041 Irradiation cystitis with hematuria: Secondary | ICD-10-CM | POA: Diagnosis not present

## 2018-07-24 LAB — GLUCOSE, CAPILLARY
Glucose-Capillary: 322 mg/dL — ABNORMAL HIGH (ref 70–99)
Glucose-Capillary: 318 mg/dL — ABNORMAL HIGH (ref 70–99)

## 2018-07-25 DIAGNOSIS — N3041 Irradiation cystitis with hematuria: Secondary | ICD-10-CM | POA: Diagnosis not present

## 2018-07-25 LAB — GLUCOSE, CAPILLARY
GLUCOSE-CAPILLARY: 403 mg/dL — AB (ref 70–99)
Glucose-Capillary: 485 mg/dL — ABNORMAL HIGH (ref 70–99)

## 2018-07-26 DIAGNOSIS — N3041 Irradiation cystitis with hematuria: Secondary | ICD-10-CM | POA: Diagnosis not present

## 2018-07-26 LAB — GLUCOSE, CAPILLARY
GLUCOSE-CAPILLARY: 395 mg/dL — AB (ref 70–99)
GLUCOSE-CAPILLARY: 438 mg/dL — AB (ref 70–99)

## 2018-07-27 DIAGNOSIS — N3041 Irradiation cystitis with hematuria: Secondary | ICD-10-CM | POA: Diagnosis not present

## 2018-07-27 LAB — GLUCOSE, CAPILLARY
GLUCOSE-CAPILLARY: 264 mg/dL — AB (ref 70–99)
Glucose-Capillary: 270 mg/dL — ABNORMAL HIGH (ref 70–99)

## 2018-07-28 DIAGNOSIS — N3041 Irradiation cystitis with hematuria: Secondary | ICD-10-CM | POA: Diagnosis not present

## 2018-07-28 LAB — GLUCOSE, CAPILLARY
GLUCOSE-CAPILLARY: 320 mg/dL — AB (ref 70–99)
Glucose-Capillary: 380 mg/dL — ABNORMAL HIGH (ref 70–99)

## 2018-08-01 ENCOUNTER — Encounter (HOSPITAL_BASED_OUTPATIENT_CLINIC_OR_DEPARTMENT_OTHER): Payer: Medicare Other | Attending: Internal Medicine

## 2018-08-01 DIAGNOSIS — Z8546 Personal history of malignant neoplasm of prostate: Secondary | ICD-10-CM | POA: Diagnosis not present

## 2018-08-01 DIAGNOSIS — N3041 Irradiation cystitis with hematuria: Secondary | ICD-10-CM | POA: Insufficient documentation

## 2018-08-01 DIAGNOSIS — Y842 Radiological procedure and radiotherapy as the cause of abnormal reaction of the patient, or of later complication, without mention of misadventure at the time of the procedure: Secondary | ICD-10-CM | POA: Insufficient documentation

## 2018-08-01 LAB — GLUCOSE, CAPILLARY
Glucose-Capillary: 356 mg/dL — ABNORMAL HIGH (ref 70–99)
Glucose-Capillary: 343 mg/dL — ABNORMAL HIGH (ref 70–99)

## 2018-08-02 DIAGNOSIS — N3041 Irradiation cystitis with hematuria: Secondary | ICD-10-CM | POA: Diagnosis not present

## 2018-08-02 LAB — GLUCOSE, CAPILLARY
GLUCOSE-CAPILLARY: 352 mg/dL — AB (ref 70–99)
Glucose-Capillary: 391 mg/dL — ABNORMAL HIGH (ref 70–99)

## 2018-08-03 DIAGNOSIS — N3041 Irradiation cystitis with hematuria: Secondary | ICD-10-CM | POA: Diagnosis not present

## 2018-08-03 LAB — GLUCOSE, CAPILLARY
GLUCOSE-CAPILLARY: 343 mg/dL — AB (ref 70–99)
GLUCOSE-CAPILLARY: 317 mg/dL — AB (ref 70–99)

## 2018-08-04 DIAGNOSIS — N3041 Irradiation cystitis with hematuria: Secondary | ICD-10-CM | POA: Diagnosis not present

## 2018-08-04 LAB — GLUCOSE, CAPILLARY
GLUCOSE-CAPILLARY: 340 mg/dL — AB (ref 70–99)
Glucose-Capillary: 318 mg/dL — ABNORMAL HIGH (ref 70–99)

## 2018-08-07 DIAGNOSIS — N3041 Irradiation cystitis with hematuria: Secondary | ICD-10-CM | POA: Diagnosis not present

## 2018-08-07 LAB — GLUCOSE, CAPILLARY
GLUCOSE-CAPILLARY: 321 mg/dL — AB (ref 70–99)
Glucose-Capillary: 287 mg/dL — ABNORMAL HIGH (ref 70–99)

## 2018-08-08 DIAGNOSIS — N3041 Irradiation cystitis with hematuria: Secondary | ICD-10-CM | POA: Diagnosis not present

## 2018-08-08 LAB — GLUCOSE, CAPILLARY
Glucose-Capillary: 456 mg/dL — ABNORMAL HIGH (ref 70–99)
Glucose-Capillary: 374 mg/dL — ABNORMAL HIGH (ref 70–99)

## 2018-08-09 DIAGNOSIS — N3041 Irradiation cystitis with hematuria: Secondary | ICD-10-CM | POA: Diagnosis not present

## 2018-08-09 LAB — GLUCOSE, CAPILLARY
GLUCOSE-CAPILLARY: 315 mg/dL — AB (ref 70–99)
Glucose-Capillary: 329 mg/dL — ABNORMAL HIGH (ref 70–99)

## 2018-08-10 DIAGNOSIS — N3041 Irradiation cystitis with hematuria: Secondary | ICD-10-CM | POA: Diagnosis not present

## 2018-08-10 LAB — GLUCOSE, CAPILLARY
GLUCOSE-CAPILLARY: 301 mg/dL — AB (ref 70–99)
Glucose-Capillary: 292 mg/dL — ABNORMAL HIGH (ref 70–99)

## 2018-08-11 DIAGNOSIS — N3041 Irradiation cystitis with hematuria: Secondary | ICD-10-CM | POA: Diagnosis not present

## 2018-08-11 LAB — GLUCOSE, CAPILLARY
GLUCOSE-CAPILLARY: 307 mg/dL — AB (ref 70–99)
Glucose-Capillary: 419 mg/dL — ABNORMAL HIGH (ref 70–99)

## 2018-08-14 DIAGNOSIS — N3041 Irradiation cystitis with hematuria: Secondary | ICD-10-CM | POA: Diagnosis not present

## 2018-08-14 LAB — GLUCOSE, CAPILLARY
Glucose-Capillary: 319 mg/dL — ABNORMAL HIGH (ref 70–99)
Glucose-Capillary: 305 mg/dL — ABNORMAL HIGH (ref 70–99)

## 2018-08-15 DIAGNOSIS — N3041 Irradiation cystitis with hematuria: Secondary | ICD-10-CM | POA: Diagnosis not present

## 2018-08-15 LAB — GLUCOSE, CAPILLARY
GLUCOSE-CAPILLARY: 209 mg/dL — AB (ref 70–99)
GLUCOSE-CAPILLARY: 229 mg/dL — AB (ref 70–99)

## 2018-08-16 DIAGNOSIS — N3041 Irradiation cystitis with hematuria: Secondary | ICD-10-CM | POA: Diagnosis not present

## 2018-08-16 LAB — GLUCOSE, CAPILLARY
Glucose-Capillary: 336 mg/dL — ABNORMAL HIGH (ref 70–99)
Glucose-Capillary: 359 mg/dL — ABNORMAL HIGH (ref 70–99)

## 2018-08-17 DIAGNOSIS — N3041 Irradiation cystitis with hematuria: Secondary | ICD-10-CM | POA: Diagnosis not present

## 2018-08-17 LAB — GLUCOSE, CAPILLARY
GLUCOSE-CAPILLARY: 414 mg/dL — AB (ref 70–99)
GLUCOSE-CAPILLARY: 379 mg/dL — AB (ref 70–99)

## 2018-08-18 DIAGNOSIS — N3041 Irradiation cystitis with hematuria: Secondary | ICD-10-CM | POA: Diagnosis not present

## 2018-08-18 LAB — GLUCOSE, CAPILLARY
GLUCOSE-CAPILLARY: 421 mg/dL — AB (ref 70–99)
Glucose-Capillary: 375 mg/dL — ABNORMAL HIGH (ref 70–99)

## 2018-08-22 DIAGNOSIS — N3041 Irradiation cystitis with hematuria: Secondary | ICD-10-CM | POA: Diagnosis not present

## 2018-08-22 LAB — GLUCOSE, CAPILLARY
Glucose-Capillary: 265 mg/dL — ABNORMAL HIGH (ref 70–99)
Glucose-Capillary: 229 mg/dL — ABNORMAL HIGH (ref 70–99)

## 2018-08-23 DIAGNOSIS — N3041 Irradiation cystitis with hematuria: Secondary | ICD-10-CM | POA: Diagnosis not present

## 2018-08-23 LAB — GLUCOSE, CAPILLARY
Glucose-Capillary: 362 mg/dL — ABNORMAL HIGH (ref 70–99)
Glucose-Capillary: 342 mg/dL — ABNORMAL HIGH (ref 70–99)

## 2018-08-25 DIAGNOSIS — N3041 Irradiation cystitis with hematuria: Secondary | ICD-10-CM | POA: Diagnosis not present

## 2018-08-25 LAB — GLUCOSE, CAPILLARY: Glucose-Capillary: 513 mg/dL (ref 70–99)

## 2018-08-28 ENCOUNTER — Encounter (HOSPITAL_COMMUNITY): Payer: Self-pay | Admitting: Emergency Medicine

## 2018-08-28 ENCOUNTER — Other Ambulatory Visit: Payer: Self-pay

## 2018-08-28 ENCOUNTER — Emergency Department (HOSPITAL_COMMUNITY)
Admission: EM | Admit: 2018-08-28 | Discharge: 2018-08-28 | Disposition: A | Discharge status: Home / Self Care | Payer: Medicare Other | Attending: Emergency Medicine | Admitting: Emergency Medicine

## 2018-08-28 DIAGNOSIS — I10 Essential (primary) hypertension: Secondary | ICD-10-CM | POA: Insufficient documentation

## 2018-08-28 DIAGNOSIS — Z794 Long term (current) use of insulin: Secondary | ICD-10-CM | POA: Diagnosis not present

## 2018-08-28 DIAGNOSIS — Z79899 Other long term (current) drug therapy: Secondary | ICD-10-CM | POA: Diagnosis not present

## 2018-08-28 DIAGNOSIS — N390 Urinary tract infection, site not specified: Secondary | ICD-10-CM | POA: Diagnosis not present

## 2018-08-28 DIAGNOSIS — Z8546 Personal history of malignant neoplasm of prostate: Secondary | ICD-10-CM | POA: Insufficient documentation

## 2018-08-28 DIAGNOSIS — R339 Retention of urine, unspecified: Secondary | ICD-10-CM | POA: Diagnosis present

## 2018-08-28 DIAGNOSIS — Z7982 Long term (current) use of aspirin: Secondary | ICD-10-CM | POA: Insufficient documentation

## 2018-08-28 DIAGNOSIS — Z87891 Personal history of nicotine dependence: Secondary | ICD-10-CM | POA: Diagnosis not present

## 2018-08-28 DIAGNOSIS — N3041 Irradiation cystitis with hematuria: Secondary | ICD-10-CM | POA: Diagnosis not present

## 2018-08-28 HISTORY — DX: Disorder of kidney and ureter, unspecified: N28.9

## 2018-08-28 LAB — URINALYSIS, ROUTINE W REFLEX MICROSCOPIC
BILIRUBIN URINE: NEGATIVE
GLUCOSE, UA: 150 mg/dL — AB
KETONES UR: NEGATIVE mg/dL
NITRITE: NEGATIVE
PH: 6 (ref 5.0–8.0)
Protein, ur: 100 mg/dL — AB
Specific Gravity, Urine: 1.01 (ref 1.005–1.030)

## 2018-08-28 LAB — GLUCOSE, CAPILLARY
Glucose-Capillary: 254 mg/dL — ABNORMAL HIGH (ref 70–99)
Glucose-Capillary: 256 mg/dL — ABNORMAL HIGH (ref 70–99)

## 2018-08-28 MED ORDER — SULFAMETHOXAZOLE-TRIMETHOPRIM 800-160 MG PO TABS: 1.0000 | ORAL_TABLET | Freq: Two times a day (BID) | ORAL | 0 refills | Status: DC

## 2018-08-28 MED ORDER — SULFAMETHOXAZOLE-TRIMETHOPRIM 800-160 MG PO TABS
1.0000 | ORAL_TABLET | Freq: Once | ORAL | Status: AC
  Administered 2018-08-28: 1 via ORAL
  Filled 2018-08-28: qty 1

## 2018-08-28 NOTE — ED Provider Notes (Signed)
[facility] EMERGENCY DEPARTMENT Provider Note   CSN: [id number] Arrival date & time: 08/28/18  1107     History   Chief Complaint Chief Complaint  Patient presents with  . Urinary Retention    HPI Jeffery [name] is a 67 y.o. male.  Patient was unable to pass his urine earlier today.  He claims he passed a kidney stone about 3 days ago.  Past medical history includes prostate cancer with prostatectomy and radiation treatment.  He is currently getting hyperbaric oxygen therapy to his "bladder".  Fever, sweats, chills, dysuria, flank pain.   Severity of symptoms is moderate.  Nothing makes symptoms better or worse.  While in the ED he was able to urinate.     Past Medical History:  Diagnosis Date  . Cancer [facility])    prostate  . Cervical spondylosis   . Glaucoma   . Hematuria    radiation cystitis  . History of migraine headaches   . Hx of arthroscopic knee surgery    Left knee  . Hyperlipidemia   . Hypertension   . Impaired fasting glucose   . Memory loss   . Progressive gait disorder   . Renal disorder    kidney stone  . Varicose veins     Patient Active Problem List   Diagnosis Date Noted  . Mild cognitive impairment 12/13/2017  . AKI (acute kidney injury) ([place]) 02/16/2017  . Hyponatremia 02/16/2017  . Hypokalemia 02/15/2017  . Syncope and collapse 12/07/2012  . Hyperthyroidism 12/07/2012  . Other malaise and fatigue 12/07/2012  . Disturbance of skin sensation 12/07/2012  . Cervical spondylosis without myelopathy 12/07/2012  . Abnormality of gait 12/07/2012  . 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 CHOLECYSTECTOMY    . EYE SURGERY    . PROSTATE SURGERY          Home Medications    Prior to Admission medications   Medication Sig Start Date End Date Taking? Authorizing Provider  amLODipine (NORVASC) 10 MG tablet Take 1 tablet (10 mg total) by mouth daily. 05/07/16    Ward, Delice Bison, DO  aspirin EC 81 MG tablet Take 81 mg by mouth daily.    [provider]  donepezil (ARICEPT) 10 MG tablet Take 1 tablet (10 mg total) by mouth at bedtime. 12/13/17   Marcial Pacas, MD  glimepiride (AMARYL) 2 MG tablet Take 2 mg by mouth daily with breakfast.     [provider]  Insulin Detemir (LEVEMIR FLEXTOUCH) 100 UNIT/ML Pen Inject 20 Units into the skin every morning.    [provider]  levofloxacin (LEVAQUIN) 500 MG tablet Take 1 tablet (500 mg total) by mouth daily. 07/03/18   Triplett, Tammy, PA-C  memantine (NAMENDA) 10 MG tablet Take 1 tablet (10 mg total) by mouth 2 (two) times daily. 12/13/17   Marcial Pacas, MD  oxyCODONE-acetaminophen (PERCOCET/ROXICET) 5-325 MG tablet TAKE 1 TABLET BY MOUTH EVERY 4 TO 6 HOURS AS NEEDED 04/21/18   [provider]  Potassium Chloride (KLOR-CON PO) Take 10 mEq by mouth 2 (two) times daily.     [provider]  simvastatin (ZOCOR) 20 MG tablet Take 20 mg by mouth at bedtime.    [provider]  sulfamethoxazole-trimethoprim (BACTRIM DS,SEPTRA DS) 800-160 MG tablet Take 1 tablet by mouth 2 (two) times daily for 7 days. 08/28/18 09/04/18  Nat Christen, MD    Family History Family History  Problem Relation Age of Onset  . Glaucoma Mother   . Arthritis Father   . Diabetes Father   . Fibromyalgia Sister   . Gait disorder Maternal Uncle        progessive gait disorder    Social History Social History   Tobacco Use  . Smoking status: Former Research scientist (life sciences)  . Smokeless tobacco: Never Used  Substance Use Topics  . Alcohol use: Yes    Comment: 0.5 pint of wine daily  . Drug use: No     Allergies   Penicillins; Atenolol; Clonidine derivatives; Metformin and related; and Tape   Review of Systems Review of Systems  All other systems reviewed and are negative.    Physical Exam Updated Vital Signs BP (!) 144/88 (BP Location: Right Arm)   Pulse 70   Temp (!) 97.5 F (36.4 C) (Oral)    Resp 16   Ht 6\' 2"  (1.88 m)   Wt 102.5 kg   SpO2 99%   BMI 29.02 kg/m   Physical Exam  Constitutional: He is oriented to person, place, and time. He appears well-developed and well-nourished.  HENT:  Head: Normocephalic and atraumatic.  Eyes: Conjunctivae are normal.  Neck: Neck supple.  Cardiovascular: Normal rate and regular rhythm.  Pulmonary/Chest: Effort normal and breath sounds normal.  Abdominal: Soft. Bowel sounds are normal.  Nontender suprapubic area.  Musculoskeletal: Normal range of motion.  Neurological: He is alert and oriented to person, place, and time.  Skin: Skin is warm and dry.  Psychiatric: He has a normal mood and affect. His behavior is normal.  Nursing note and vitals reviewed.    ED Treatments / Results  Labs (all labs ordered are listed, but only abnormal results are displayed) Labs Reviewed  URINALYSIS, ROUTINE W REFLEX MICROSCOPIC - Abnormal; Notable for the following components:      Result Value   Color, Urine AMBER (*)    APPearance TURBID (*)    Glucose, UA 150 (*)    Hgb urine dipstick MODERATE (*)    Protein, ur 100 (*)    Leukocytes, UA LARGE (*)    WBC, UA >50 (*)    Bacteria, UA FEW (*)    Non Squamous Epithelial 0-5 (*)    All other components within normal limits  URINE CULTURE    EKG None  Radiology No results found.  Procedures Procedures (including critical care time)  Medications Ordered in ED Medications  sulfamethoxazole-trimethoprim (BACTRIM DS,SEPTRA DS) 800-160 MG per tablet 1 tablet (1 tablet Oral Given 08/28/18 1305)     Initial Impression / Assessment and Plan / ED Course  I have reviewed the triage vital signs and the nursing notes.  Pertinent labs & imaging results that were available during my care of the patient were reviewed by me and considered in my medical decision making (see chart for details).     Patient reports urinary retention earlier today.  He was able to urinate in the emergency  department.  He has a nonfocal physical exam.  Urinalysis shows blood and evidence of infection.  Will culture urine and start Septra DS.  [Did not use cephalexin secondary to allergy profile].  Final Clinical Impressions(s) / ED Diagnoses   Final diagnoses:  Urinary tract infection without hematuria, site unspecified    ED Discharge Orders         Ordered    sulfamethoxazole-trimethoprim (BACTRIM DS,SEPTRA DS) 800-160 MG tablet  2 times daily,   Status:  Discontinued  08/28/18 1320    sulfamethoxazole-trimethoprim (BACTRIM DS,SEPTRA DS) 800-160 MG tablet  2 times daily     08/28/18 1322           Nat Christen, MD 08/28/18 1354

## 2018-08-28 NOTE — ED Notes (Signed)
Pt able to urinate and provide specimen Of urine

## 2018-08-28 NOTE — ED Triage Notes (Signed)
Pt reports had kidney stone a few days ago. Pt reports was having trouble going to restroom approximately one hour ago. Pt states when he arrived to ED he went to restroom and was able to urinate.

## 2018-08-28 NOTE — Discharge Instructions (Addendum)
Urine sample shows evidence of infection.  Prescription for antibiotic.  Increase fluids.  Follow-up with your urologist.

## 2018-08-29 ENCOUNTER — Encounter (HOSPITAL_BASED_OUTPATIENT_CLINIC_OR_DEPARTMENT_OTHER): Payer: Medicare Other | Attending: Internal Medicine

## 2018-08-29 DIAGNOSIS — Y842 Radiological procedure and radiotherapy as the cause of abnormal reaction of the patient, or of later complication, without mention of misadventure at the time of the procedure: Secondary | ICD-10-CM | POA: Diagnosis not present

## 2018-08-29 DIAGNOSIS — Z8546 Personal history of malignant neoplasm of prostate: Secondary | ICD-10-CM | POA: Diagnosis not present

## 2018-08-29 DIAGNOSIS — N3041 Irradiation cystitis with hematuria: Secondary | ICD-10-CM | POA: Diagnosis present

## 2018-08-29 LAB — GLUCOSE, CAPILLARY
Glucose-Capillary: 162 mg/dL — ABNORMAL HIGH (ref 70–99)
Glucose-Capillary: 349 mg/dL — ABNORMAL HIGH (ref 70–99)
Glucose-Capillary: 308 mg/dL — ABNORMAL HIGH (ref 70–99)

## 2018-08-30 DIAGNOSIS — N3041 Irradiation cystitis with hematuria: Secondary | ICD-10-CM | POA: Diagnosis not present

## 2018-08-30 LAB — GLUCOSE, CAPILLARY
GLUCOSE-CAPILLARY: 345 mg/dL — AB (ref 70–99)
GLUCOSE-CAPILLARY: 333 mg/dL — AB (ref 70–99)

## 2018-08-30 LAB — URINE CULTURE

## 2018-08-31 ENCOUNTER — Telehealth: Payer: Self-pay | Admitting: *Deleted

## 2018-08-31 DIAGNOSIS — N3041 Irradiation cystitis with hematuria: Secondary | ICD-10-CM | POA: Diagnosis not present

## 2018-08-31 LAB — GLUCOSE, CAPILLARY: GLUCOSE-CAPILLARY: 293 mg/dL — AB (ref 70–99)

## 2018-08-31 NOTE — Telephone Encounter (Signed)
Post ED Visit - Positive Culture Follow-up: Unsuccessful Patient Follow-up  Culture assessed and recommendations reviewed by:  []  Elenor Quinones, Pharm.D. []  Heide Guile, Pharm.D., BCPS AQ-ID []  Parks Neptune, Pharm.D., BCPS []  Alycia Rossetti, Pharm.D., BCPS []  [place], [place].D., BCPS, AAHIVP []  Legrand Como, Pharm.D., BCPS, AAHIVP []  Wynell Balloon, PharmD []  Vincenza Hews, PharmD, BCPS Vertis Kelch, PharmD  Positive urine culture, reviewed by [facility]  []  Patient discharged without antimicrobial prescription and treatment is now indicated [x]  Organism is resistant to prescribed ED discharge antimicrobial []  Patient with positive blood cultures  Plan:  Stop Sulfamethoxazole-trimethoprim and start Fluconalzole 200mg  daily x 14 days  Unable to contact patient after 3 attempts, letter will be sent to address on file  Ardeen Fillers 08/31/2018, 10:26 AM

## 2018-08-31 NOTE — Progress Notes (Signed)
ED Antimicrobial Stewardship Positive Culture Follow Up   Jeffery [name] is an 67 y.o. male who presented to [place] on 08/28/2018 with a chief complaint of urinary retention, fever, sweats, chills, and flank pain. Patient has a past medical history of prostate cancer (prostatectomy and radiation). Patient also has foley catheter placed for ~ 2 months and states he passed a kidney stone 3 days prior to arrival to ED.  Chief Complaint  Patient presents with  . Urinary Retention    Recent Results (from the past 720 hour(s))  Urine culture     Status: Abnormal   Collection Time: 08/28/18 12:43 PM  Result Value Ref Range Status   Specimen Description   Final    URINE, CLEAN CATCH Performed at [facility], [place] [address], [place], [place] [address]    Special Requests   Final    NONE Performed at [facility], [address], [place],  [place] [address]    Culture >=100,000 COLONIES/mL YEAST (A)  Final   Report Status 08/30/2018 FINAL  Final    Patient was discharged with sulfamethoxazole-trimethoprim 800-160 x 7 days. However, this will not treat the yeast that grew in urine culture.  New antibiotic prescription: fluconazole 200 mg daily x 14 days.  Discontinue sulfamethoxazole-trimethoprim  ED Provider: Shelby Dubin, PA-C  Ronna Polio 08/31/2018, 9:53 AM Clinical Pharmacist Monday - Friday phone -  [phone] Saturday - Sunday phone - [phone]

## 2018-09-01 DIAGNOSIS — N3041 Irradiation cystitis with hematuria: Secondary | ICD-10-CM | POA: Diagnosis not present

## 2018-09-01 LAB — GLUCOSE, CAPILLARY
GLUCOSE-CAPILLARY: 204 mg/dL — AB (ref 70–99)
Glucose-Capillary: 243 mg/dL — ABNORMAL HIGH (ref 70–99)

## 2018-09-04 ENCOUNTER — Encounter (HOSPITAL_COMMUNITY): Payer: Self-pay

## 2018-09-04 ENCOUNTER — Other Ambulatory Visit: Payer: Self-pay

## 2018-09-04 ENCOUNTER — Emergency Department (HOSPITAL_COMMUNITY): Payer: Medicare Other

## 2018-09-04 ENCOUNTER — Emergency Department (HOSPITAL_COMMUNITY)
Admission: EM | Admit: 2018-09-04 | Discharge: 2018-09-04 | Disposition: A | Discharge status: Home / Self Care | Payer: Medicare Other | Attending: Emergency Medicine | Admitting: Emergency Medicine

## 2018-09-04 DIAGNOSIS — K5900 Constipation, unspecified: Secondary | ICD-10-CM | POA: Insufficient documentation

## 2018-09-04 DIAGNOSIS — Z87891 Personal history of nicotine dependence: Secondary | ICD-10-CM | POA: Diagnosis not present

## 2018-09-04 DIAGNOSIS — Z8546 Personal history of malignant neoplasm of prostate: Secondary | ICD-10-CM | POA: Insufficient documentation

## 2018-09-04 DIAGNOSIS — Z79899 Other long term (current) drug therapy: Secondary | ICD-10-CM | POA: Insufficient documentation

## 2018-09-04 DIAGNOSIS — R1030 Lower abdominal pain, unspecified: Secondary | ICD-10-CM | POA: Diagnosis present

## 2018-09-04 DIAGNOSIS — I10 Essential (primary) hypertension: Secondary | ICD-10-CM | POA: Insufficient documentation

## 2018-09-04 DIAGNOSIS — Z7982 Long term (current) use of aspirin: Secondary | ICD-10-CM | POA: Diagnosis not present

## 2018-09-04 DIAGNOSIS — R339 Retention of urine, unspecified: Secondary | ICD-10-CM | POA: Insufficient documentation

## 2018-09-04 DIAGNOSIS — N3000 Acute cystitis without hematuria: Secondary | ICD-10-CM | POA: Insufficient documentation

## 2018-09-04 LAB — URINALYSIS, ROUTINE W REFLEX MICROSCOPIC
BILIRUBIN URINE: NEGATIVE
Glucose, UA: >=500 mg/dL — AB
Ketones, ur: NEGATIVE mg/dL
NITRITE: NEGATIVE
PH: 6 (ref 5.0–8.0)
Protein, ur: 100 mg/dL — AB
SPECIFIC GRAVITY, URINE: 1.012 (ref 1.005–1.030)

## 2018-09-04 MED ORDER — SULFAMETHOXAZOLE-TRIMETHOPRIM 800-160 MG PO TABS: 1.0000 | ORAL_TABLET | Freq: Two times a day (BID) | ORAL | 0 refills | Status: AC

## 2018-09-04 NOTE — Discharge Instructions (Addendum)
Your testing shows that there is lots of blood cells in your urine which is what we have seen at prior samples as well.  Please start taking Bactrim, twice daily for 7 days.  We will send a urine culture, if the culture is positive and you need a different antibiotic we will let you know.  You should follow-up with your urologist within the next week, have the discussion with them about your ongoing urinary retention and the benefit of potential procedures such as a TURP  In the meantime please take medications for your constipation including the following  1.  Magnesium citrate, drink 1 bottle, 6 to 8 ounces  2.  MiraLAX, take 1 scoop twice daily for the next week  3.  Colace, 100 mg by mouth twice daily,  When you are having regular soft stools you may stop taking these medications.  Return to the emergency department if you are unable to urinate for more than 18 hours or you develop severe pain, fever or vomiting.

## 2018-09-04 NOTE — ED Triage Notes (Signed)
Pt reports taking iron pills about a week ago. Reports hard black stool he noticed on the tip of suppository which did not relieve constipation. Pt reports urinary retention that started this morning. Pt says he "dribbled a little" and unable to go now.

## 2018-09-04 NOTE — ED Provider Notes (Signed)
[facility] EMERGENCY DEPARTMENT Provider Note   CSN: [id number] Arrival date & time: 09/04/18  8242     History   Chief Complaint Chief Complaint  Patient presents with  . Constipation  . Urinary Retention    HPI Jeffery [name] is a 67 y.o. male.  HPI  67 year old male with a known history of prostate cancer status post radiation treatment, he has also had a history of radiation cystitis, he has required interventions from their urology service to irrigate and remove clots from his bladder when he has intermittent urinary retention.  He also reports that he goes to hyperbaric oxygen treatments for this chronic hematuria which she states helps but unfortunately makes him constipated.  He presents today with some lower abdominal discomfort and intermittent urinary retention over the last couple of days.  When he has been able to pass urine it is just a small trickle.  He denies fevers back pain vomiting or swelling of the legs.  The symptoms are intermittent over time, they seem to get worse at sometimes and better at others.  He has required intermittent straight cath to drain his bladder in the past.  Past Medical History:  Diagnosis Date  . Cancer [facility])    prostate  . Cervical spondylosis   . Glaucoma   . Hematuria    radiation cystitis  . History of migraine headaches   . Hx of arthroscopic knee surgery    Left knee  . Hyperlipidemia   . Hypertension   . Impaired fasting glucose   . Memory loss   . Progressive gait disorder   . Renal disorder    kidney stone  . Varicose veins     Patient Active Problem List   Diagnosis Date Noted  . Mild cognitive impairment 12/13/2017  . AKI (acute kidney injury) ([place]) 02/16/2017  . Hyponatremia 02/16/2017  . Hypokalemia 02/15/2017  . Syncope and collapse 12/07/2012  . Hyperthyroidism 12/07/2012  . Other malaise and fatigue 12/07/2012  . Disturbance of skin sensation 12/07/2012  . Cervical spondylosis without myelopathy  12/07/2012  . Abnormality of gait 12/07/2012  . 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 CHOLECYSTECTOMY    . EYE SURGERY    . PROSTATE SURGERY          Home Medications    Prior to Admission medications   Medication Sig Start Date End Date Taking? Authorizing Provider  amLODipine (NORVASC) 10 MG tablet Take 1 tablet (10 mg total) by mouth daily. 05/07/16   Ward, Delice Bison, DO  aspirin EC 81 MG tablet Take 81 mg by mouth daily.    [provider]  donepezil (ARICEPT) 10 MG tablet Take 1 tablet (10 mg total) by mouth at bedtime. 12/13/17   Marcial Pacas, MD  glimepiride (AMARYL) 2 MG tablet Take 2 mg by mouth daily with breakfast.     [provider]  Insulin Detemir (LEVEMIR FLEXTOUCH) 100 UNIT/ML Pen Inject 20 Units into the skin every morning.    [provider]  memantine (NAMENDA) 10 MG tablet Take 1 tablet (10 mg total) by mouth 2 (two) times daily. 12/13/17   Marcial Pacas, MD  oxyCODONE-acetaminophen (PERCOCET/ROXICET) 5-325 MG tablet TAKE 1 TABLET BY MOUTH EVERY 4 TO 6 HOURS AS NEEDED 04/21/18   [provider]  Potassium Chloride (KLOR-CON PO) Take 10 mEq by mouth 2 (two) times daily.     [provider]  simvastatin (ZOCOR) 20 MG tablet Take 20 mg by mouth at bedtime.    [provider]  sulfamethoxazole-trimethoprim (BACTRIM DS,SEPTRA DS) 800-160 MG tablet Take 1 tablet by mouth 2 (two) times daily for 7 days. 09/04/18 09/11/18  Noemi Chapel, MD    Family History Family History  Problem Relation Age of Onset  . Glaucoma Mother   . Arthritis Father   . Diabetes Father   . Fibromyalgia Sister   . Gait disorder Maternal Uncle        progessive gait disorder    Social History Social History   Tobacco Use  . Smoking status: Former Research scientist (life sciences)  . Smokeless tobacco: Never Used  Substance Use Topics  . Alcohol use: Yes    Comment: 0.5 pint of  wine daily  . Drug use: No     Allergies   Penicillins; Atenolol; Clonidine derivatives; Metformin and related; and Tape   Review of Systems Review of Systems  All other systems reviewed and are negative.    Physical Exam Updated Vital Signs BP (!) 156/89   Pulse 74   Temp 98.2 F (36.8 C) (Oral)   Resp 17   Ht 1.88 m (6\' 2" )   Wt 102.5 kg   SpO2 97%   BMI 29.01 kg/m   Physical Exam  Constitutional: He appears well-developed and well-nourished. No distress.  HENT:  Head: Normocephalic and atraumatic.  Mouth/Throat: Oropharynx is clear and moist. No oropharyngeal exudate.  Eyes: Pupils are equal, round, and reactive to light. Conjunctivae and EOM are normal. Right eye exhibits no discharge. Left eye exhibits no discharge. No scleral icterus.  Neck: Normal range of motion. Neck supple. No JVD present. No thyromegaly present.  Cardiovascular: Normal rate, regular rhythm, normal heart sounds and intact distal pulses. Exam reveals no gallop and no friction rub.  No murmur heard. Pulmonary/Chest: Effort normal and breath sounds normal. No respiratory distress. He has no wheezes. He has no rales.  Abdominal: Soft. Bowel sounds are normal. He exhibits no distension and no mass. There is tenderness ( Tenderness in the suprapubic region, no guarding, no other abdominal tenderness.).  Genitourinary:  Genitourinary Comments: Normal-appearing uncircumcised penis, foreskin is easily retractable, small amount of urine at the urethral meatus  Musculoskeletal: Normal range of motion. He exhibits no edema or tenderness.  Lymphadenopathy:    He has no cervical adenopathy.  Neurological: He is alert. Coordination normal.  Skin: Skin is warm and dry. No rash noted. No erythema.  Psychiatric: He has a normal mood and affect. His behavior is normal.  Nursing note and vitals reviewed.    ED Treatments / Results  Labs (all labs ordered are listed, but only abnormal results are  displayed) Labs Reviewed  URINALYSIS, ROUTINE W REFLEX MICROSCOPIC - Abnormal; Notable for the following components:      Result Value   APPearance TURBID (*)    Glucose, UA >=500 (*)    Hgb urine dipstick MODERATE (*)    Protein, ur 100 (*)    Leukocytes, UA LARGE (*)    WBC, UA >50 (*)    Bacteria, UA RARE (*)    All other components within normal limits  URINE CULTURE    EKG None  Radiology Dg Abdomen 1 View  Result Date: 09/04/2018 CLINICAL DATA:  Constipation and urinary retention EXAM: ABDOMEN - 1 VIEW COMPARISON:  Abdominopelvic CT scan of March 21, 2018 FINDINGS: The colonic stool burden is increased. The rectal stool burden does not appear increased. There small  amounts of gas within small and large bowel loops. There is no evidence of obstruction. No abnormal calcifications are observed. The bony structures exhibit no acute abnormalities. IMPRESSION: Moderately increased colonic stool burden likely reflects constipation. No evidence of bowel obstruction. Degenerative facet joint changes at L5-S1. Electronically Signed   By: David  Martinique M.D.   On: 09/04/2018 07:31    Procedures Procedures (including critical care time)  Medications Ordered in ED Medications - No data to display   Initial Impression / Assessment and Plan / ED Course  I have reviewed the triage vital signs and the nursing notes.  Pertinent labs & imaging results that were available during my care of the patient were reviewed by me and considered in my medical decision making (see chart for details).  Clinical Course as of Sep 04 845  Mon Sep 04, 2018  3559 Analysis shows large leukocytes, negative nitrite, white blood cells greater than 50 and rare bacteria, will send culture, will treat with antibiotics, no fever tachycardia hypotension or any other signs of sepsis.  Patient agreeable.  Will refer to urology for possible transurethral resection of the prostate.   [BM]    Clinical Course User  Index [BM] Noemi Chapel, MD    Overall the patient is well-appearing, he has recurrent urinary retention, his abdominal discomfort may be coming from that or the excessive amount of stool as was seen on his abdomen x-ray.  Will perform bladder scan, if substantial amount of urine will need to be drained, will also give the patient a bowel regimen to help him clear out his stool.  He does not appear obstructed is not nauseated and has a nonsurgical abdomen.  Bladder scan revealed 300 cc of urine.  The patient is not on any Flomax, will likely start this medication and follow-up with urology.  Last scan showed 150 cc of urine, the patient does not need in and out catheterization.  He was able to urinate a lot  Final Clinical Impressions(s) / ED Diagnoses   Final diagnoses:  Acute cystitis without hematuria  Constipation, unspecified constipation type    ED Discharge Orders         Ordered    sulfamethoxazole-trimethoprim (BACTRIM DS,SEPTRA DS) 800-160 MG tablet  2 times daily     09/04/18 0841           Noemi Chapel, MD 09/04/18 [phone]

## 2018-09-04 NOTE — ED Notes (Signed)
Nurse requested a bladder scan due to PT. experiencing an inability to urinate. Bladder scan resulted showing 312 mL.

## 2018-09-05 DIAGNOSIS — N3041 Irradiation cystitis with hematuria: Secondary | ICD-10-CM | POA: Diagnosis not present

## 2018-09-05 LAB — GLUCOSE, CAPILLARY
Glucose-Capillary: 295 mg/dL — ABNORMAL HIGH (ref 70–99)
Glucose-Capillary: 300 mg/dL — ABNORMAL HIGH (ref 70–99)

## 2018-09-05 LAB — URINE CULTURE: Culture: <10000 — AB

## 2018-09-07 DIAGNOSIS — N3041 Irradiation cystitis with hematuria: Secondary | ICD-10-CM | POA: Diagnosis not present

## 2018-09-07 LAB — GLUCOSE, CAPILLARY
GLUCOSE-CAPILLARY: 293 mg/dL — AB (ref 70–99)
GLUCOSE-CAPILLARY: 291 mg/dL — AB (ref 70–99)

## 2018-09-08 DIAGNOSIS — N3041 Irradiation cystitis with hematuria: Secondary | ICD-10-CM | POA: Diagnosis not present

## 2018-09-08 LAB — GLUCOSE, CAPILLARY
Glucose-Capillary: 317 mg/dL — ABNORMAL HIGH (ref 70–99)
Glucose-Capillary: 305 mg/dL — ABNORMAL HIGH (ref 70–99)

## 2018-09-11 DIAGNOSIS — N3041 Irradiation cystitis with hematuria: Secondary | ICD-10-CM | POA: Diagnosis not present

## 2018-09-11 LAB — GLUCOSE, CAPILLARY
GLUCOSE-CAPILLARY: 222 mg/dL — AB (ref 70–99)
Glucose-Capillary: 216 mg/dL — ABNORMAL HIGH (ref 70–99)

## 2018-09-12 DIAGNOSIS — N3041 Irradiation cystitis with hematuria: Secondary | ICD-10-CM | POA: Diagnosis not present

## 2018-09-12 LAB — GLUCOSE, CAPILLARY
GLUCOSE-CAPILLARY: 441 mg/dL — AB (ref 70–99)
GLUCOSE-CAPILLARY: 366 mg/dL — AB (ref 70–99)

## 2018-09-13 DIAGNOSIS — N3041 Irradiation cystitis with hematuria: Secondary | ICD-10-CM | POA: Diagnosis not present

## 2018-09-13 LAB — GLUCOSE, CAPILLARY
GLUCOSE-CAPILLARY: 357 mg/dL — AB (ref 70–99)
GLUCOSE-CAPILLARY: 333 mg/dL — AB (ref 70–99)

## 2018-09-14 DIAGNOSIS — N3041 Irradiation cystitis with hematuria: Secondary | ICD-10-CM | POA: Diagnosis not present

## 2018-09-14 LAB — GLUCOSE, CAPILLARY
GLUCOSE-CAPILLARY: 431 mg/dL — AB (ref 70–99)
GLUCOSE-CAPILLARY: 395 mg/dL — AB (ref 70–99)

## 2018-09-15 DIAGNOSIS — N3041 Irradiation cystitis with hematuria: Secondary | ICD-10-CM | POA: Diagnosis not present

## 2018-09-15 LAB — GLUCOSE, CAPILLARY
Glucose-Capillary: 357 mg/dL — ABNORMAL HIGH (ref 70–99)
Glucose-Capillary: 316 mg/dL — ABNORMAL HIGH (ref 70–99)

## 2018-09-18 DIAGNOSIS — N3041 Irradiation cystitis with hematuria: Secondary | ICD-10-CM | POA: Diagnosis not present

## 2018-09-18 LAB — GLUCOSE, CAPILLARY
GLUCOSE-CAPILLARY: 154 mg/dL — AB (ref 70–99)
GLUCOSE-CAPILLARY: 272 mg/dL — AB (ref 70–99)
Glucose-Capillary: 114 mg/dL — ABNORMAL HIGH (ref 70–99)

## 2018-09-19 DIAGNOSIS — N3041 Irradiation cystitis with hematuria: Secondary | ICD-10-CM | POA: Diagnosis not present

## 2018-09-19 LAB — GLUCOSE, CAPILLARY
Glucose-Capillary: 305 mg/dL — ABNORMAL HIGH (ref 70–99)
Glucose-Capillary: 121 mg/dL — ABNORMAL HIGH (ref 70–99)
Glucose-Capillary: 281 mg/dL — ABNORMAL HIGH (ref 70–99)

## 2018-09-21 DIAGNOSIS — N3041 Irradiation cystitis with hematuria: Secondary | ICD-10-CM | POA: Diagnosis not present

## 2018-09-21 LAB — GLUCOSE, CAPILLARY
GLUCOSE-CAPILLARY: 191 mg/dL — AB (ref 70–99)
GLUCOSE-CAPILLARY: 187 mg/dL — AB (ref 70–99)

## 2018-09-25 ENCOUNTER — Other Ambulatory Visit: Payer: Self-pay

## 2018-09-25 ENCOUNTER — Emergency Department (HOSPITAL_COMMUNITY)
Admission: EM | Admit: 2018-09-25 | Discharge: 2018-09-25 | Disposition: A | Discharge status: Home / Self Care | Payer: Medicare Other | Attending: Emergency Medicine | Admitting: Emergency Medicine

## 2018-09-25 ENCOUNTER — Encounter (HOSPITAL_COMMUNITY): Payer: Self-pay | Admitting: Emergency Medicine

## 2018-09-25 DIAGNOSIS — Z7982 Long term (current) use of aspirin: Secondary | ICD-10-CM | POA: Diagnosis not present

## 2018-09-25 DIAGNOSIS — Z79899 Other long term (current) drug therapy: Secondary | ICD-10-CM | POA: Diagnosis not present

## 2018-09-25 DIAGNOSIS — I10 Essential (primary) hypertension: Secondary | ICD-10-CM | POA: Insufficient documentation

## 2018-09-25 DIAGNOSIS — Z87891 Personal history of nicotine dependence: Secondary | ICD-10-CM | POA: Insufficient documentation

## 2018-09-25 DIAGNOSIS — R339 Retention of urine, unspecified: Secondary | ICD-10-CM | POA: Diagnosis present

## 2018-09-25 LAB — URINALYSIS, ROUTINE W REFLEX MICROSCOPIC
BILIRUBIN URINE: NEGATIVE
Glucose, UA: >=500 mg/dL — AB
KETONES UR: NEGATIVE mg/dL
Nitrite: NEGATIVE
PROTEIN: 30 mg/dL — AB
SPECIFIC GRAVITY, URINE: 1.012 (ref 1.005–1.030)
pH: 6 (ref 5.0–8.0)

## 2018-09-25 MED ORDER — SULFAMETHOXAZOLE-TRIMETHOPRIM 800-160 MG PO TABS: 1.0000 | ORAL_TABLET | Freq: Two times a day (BID) | ORAL | 0 refills | Status: AC

## 2018-09-25 NOTE — ED Notes (Signed)
Bladder scan showing >200.

## 2018-09-25 NOTE — ED Triage Notes (Signed)
Pt c/o of urinary retention since midnight last night.  Pt has not urinated since.  C/o of pain and pressure

## 2018-09-25 NOTE — Discharge Instructions (Addendum)
You are seen in the emergency department today for urinary retention.  A Foley catheter was inserted which we will be discharged home with.  We also discharging home on Bactrim, and antibiotic, as your urine looks somewhat infected today.   We have prescribed you new medication(s) today. Discuss the medications prescribed today with your pharmacist as they can have adverse effects and interactions with your other medicines including over the counter and prescribed medications. Seek medical evaluation if you start to experience new or abnormal symptoms after taking one of these medicines, seek care immediately if you start to experience difficulty breathing, feeling of your throat closing, facial swelling, or rash as these could be indications of a more serious allergic reaction  We would like you to follow-up with your urologist in the next 48 hours for reevaluation and for possible removal of the Foley catheter.  Return to the ER for new or worsening symptoms or any other concerns.

## 2018-09-25 NOTE — ED Provider Notes (Signed)
[facility] EMERGENCY DEPARTMENT Provider Note   CSN: [id number] Arrival date & time: 09/25/18  1119     History   Chief Complaint Chief Complaint  Patient presents with  . Urinary Retention    HPI Jeffery [name] is a 67 y.o. male with a hx of prostate cancer s/p radiation treatment, also w/ hx of radiation cystitis HTN, hyperlipidemia, and nephrolithiasis who presents to the ED with complaints of urinary retention.  Patient states that he last urinated prior to going to bed last night at midnight.  He states he woke up this morning and had the urge to urinate but has been unable to pass any urine.  He states he is having associated suprapubic pressure/discomfort which is moderate in severity without specific bleeding or aggravating factors.  He states this feels similar to when he has required Foley catheterization.  He has required this to 3 times previously.  He additionally has required interventions from the urology service for irrigation and removal of clots from his bladder with certain episodes of urinary retention.  He denies fever, chills, nausea, vomiting, or dysuria.  He is followed by urology.  HPI  Past Medical History:  Diagnosis Date  . Cancer [facility])    prostate  . Cervical spondylosis   . Glaucoma   . Hematuria    radiation cystitis  . History of migraine headaches   . Hx of arthroscopic knee surgery    Left knee  . Hyperlipidemia   . Hypertension   . Impaired fasting glucose   . Memory loss   . Progressive gait disorder   . Renal disorder    kidney stone  . Varicose veins     Patient Active Problem List   Diagnosis Date Noted  . Mild cognitive impairment 12/13/2017  . AKI (acute kidney injury) ([place]) 02/16/2017  . Hyponatremia 02/16/2017  . Hypokalemia 02/15/2017  . Syncope and collapse 12/07/2012  . Hyperthyroidism 12/07/2012  . Other malaise and fatigue 12/07/2012  . Disturbance of skin sensation 12/07/2012  . Cervical spondylosis without  myelopathy 12/07/2012  . Abnormality of gait 12/07/2012  . 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 CHOLECYSTECTOMY    . EYE SURGERY    . PROSTATE SURGERY          Home Medications    Prior to Admission medications   Medication Sig Start Date End Date Taking? Authorizing Provider  amLODipine (NORVASC) 10 MG tablet Take 1 tablet (10 mg total) by mouth daily. 05/07/16   Ward, Delice Bison, DO  aspirin EC 81 MG tablet Take 81 mg by mouth daily.    [provider]  donepezil (ARICEPT) 10 MG tablet Take 1 tablet (10 mg total) by mouth at bedtime. 12/13/17   Marcial Pacas, MD  glimepiride (AMARYL) 2 MG tablet Take 2 mg by mouth daily with breakfast.     [provider]  Insulin Detemir (LEVEMIR FLEXTOUCH) 100 UNIT/ML Pen Inject 20 Units into the skin every morning.    [provider]  memantine (NAMENDA) 10 MG tablet Take 1 tablet (10 mg total) by mouth 2 (two) times daily. 12/13/17   Marcial Pacas, MD  oxyCODONE-acetaminophen (PERCOCET/ROXICET) 5-325 MG tablet TAKE 1 TABLET BY MOUTH EVERY 4 TO 6 HOURS AS NEEDED 04/21/18   [provider]  Potassium Chloride (KLOR-CON PO) Take 10 mEq by mouth 2 (two) times daily.     [provider]  simvastatin (ZOCOR) 20 MG tablet Take 20 mg by mouth at bedtime.    [provider]    Family History Family History  Problem Relation Age of Onset  . Glaucoma Mother   . Arthritis Father   . Diabetes Father   . Fibromyalgia Sister   . Gait disorder Maternal Uncle        progessive gait disorder    Social History Social History   Tobacco Use  . Smoking status: Former Research scientist (life sciences)  . Smokeless tobacco: Never Used  Substance Use Topics  . Alcohol use: Yes    Comment: 0.5 pint of wine daily  . Drug use: No     Allergies   Penicillins; Atenolol; Clonidine derivatives; Metformin and related; and Tape   Review of  Systems Review of Systems  Constitutional: Negative for chills and fever.  Gastrointestinal: Positive for abdominal pain (suprapubic). Negative for nausea and vomiting.  Genitourinary: Positive for difficulty urinating. Negative for discharge, dysuria, scrotal swelling and testicular pain.  All other systems reviewed and are negative.    Physical Exam Updated Vital Signs BP (!) 143/75 (BP Location: Right Arm)   Pulse 83   Temp 98.3 F (36.8 C) (Oral)   Resp 18   Ht 6\' 2"  (1.88 m)   Wt 91.6 kg   SpO2 95%   BMI 25.94 kg/m   Physical Exam  Constitutional: He appears well-developed and well-nourished.  Non-toxic appearance. No distress.  HENT:  Head: Normocephalic and atraumatic.  Eyes: Conjunctivae are normal. Right eye exhibits no discharge. Left eye exhibits no discharge.  Neck: Neck supple.  Cardiovascular: Normal rate and regular rhythm.  Pulmonary/Chest: Effort normal and breath sounds normal. No respiratory distress. He has no wheezes. He has no rhonchi. He has no rales.  Respiration even and unlabored  Abdominal: Soft. He exhibits no distension. There is tenderness (mild suprapubic). There is no rebound and no guarding.  Genitourinary: Right testis shows no mass and no tenderness. Left testis shows no mass and no tenderness. Uncircumcised. No phimosis, paraphimosis, penile erythema or penile tenderness. No discharge found.  Genitourinary Comments: RN present as chaperone  Neurological: He is alert.  Clear speech.   Skin: Skin is warm and dry. No rash noted.  Psychiatric: He has a normal mood and affect. His behavior is normal.  Nursing note and vitals reviewed.    ED Treatments / Results  Labs (all labs ordered are listed, but only abnormal results are displayed) Labs Reviewed - No data to display  EKG None  Radiology No results found.  Procedures Procedures (including critical care time)  Medications Ordered in ED Medications - No data to  display   Initial Impression / Assessment and Plan / ED Course  I have reviewed the triage vital signs and the nursing notes.  Pertinent labs & imaging results that were available during my care of the patient were reviewed by me and considered in my medical decision making (see chart for details).   Patient with history of prostate cancer status post radiation therapy with prior urinary retention presents with urinary retention and associated superpubic discomfort.  Patient has some mild tenderness in the suprapubic area however no peritoneal signs, he does not have findings consistent with a surgical abdomen. Bladder scan at bedside shows >200 ccs,upon Foley catheter insertion there was release of 700 cc of urine with significant symptomatic improvement. Per RN urine appeared somewhat cloudy, but no obvious hematuria or blood clots.   13:25: Called and spoke with  lab regarding delay in patient's UA, they are running this now.   Urinalysis reveals hematuria w/ moderate hgb, large amount of leukocytes, >50 WBCs, nitrite negative, and rare bacteria. Culture sent. Urine culture from 10/07 reviewed- insignificant growth. Patient has taken bactrim well previously therefore will start patient on this with culture pending, no fever, N/V, or hypotension, no indication of sepsis currently.  Discussed findings and plan of care with supervising physician Dr. Thurnell Garbe- recommends discharging patient with foley catheter in place with close follow up with his urologist. I discussed results, treatment plan, need for urology follow-up, and return precautions with the patient and his wife. Provided opportunity for questions, patient and his wife confirmed understanding and are in agreement with plan.    Final Clinical Impressions(s) / ED Diagnoses   Final diagnoses:  Urinary retention    ED Discharge Orders         Ordered    sulfamethoxazole-trimethoprim (BACTRIM DS,SEPTRA DS) 800-160 MG tablet  2 times daily      09/25/18 1349           Zenon Leaf, Pierron R, PA-C 09/25/18 1351    Francine Graven, DO 09/26/18 2031

## 2018-09-25 NOTE — ED Notes (Signed)
20 F indwelling catheter placed.  Patient tolerated well.

## 2018-09-27 LAB — URINE CULTURE: Culture: NO GROWTH

## 2018-09-28 ENCOUNTER — Encounter (HOSPITAL_COMMUNITY): Payer: Self-pay

## 2018-09-28 ENCOUNTER — Emergency Department (HOSPITAL_COMMUNITY)
Admission: EM | Admit: 2018-09-28 | Discharge: 2018-09-28 | Disposition: A | Discharge status: Home / Self Care | Payer: Medicare Other | Attending: Emergency Medicine | Admitting: Emergency Medicine

## 2018-09-28 ENCOUNTER — Other Ambulatory Visit: Payer: Self-pay

## 2018-09-28 DIAGNOSIS — Z87891 Personal history of nicotine dependence: Secondary | ICD-10-CM | POA: Diagnosis not present

## 2018-09-28 DIAGNOSIS — Z8546 Personal history of malignant neoplasm of prostate: Secondary | ICD-10-CM | POA: Diagnosis not present

## 2018-09-28 DIAGNOSIS — I10 Essential (primary) hypertension: Secondary | ICD-10-CM | POA: Diagnosis not present

## 2018-09-28 DIAGNOSIS — T83038A Leakage of other indwelling urethral catheter, initial encounter: Secondary | ICD-10-CM | POA: Insufficient documentation

## 2018-09-28 DIAGNOSIS — Z978 Presence of other specified devices: Secondary | ICD-10-CM

## 2018-09-28 DIAGNOSIS — E785 Hyperlipidemia, unspecified: Secondary | ICD-10-CM | POA: Insufficient documentation

## 2018-09-28 DIAGNOSIS — Z794 Long term (current) use of insulin: Secondary | ICD-10-CM | POA: Insufficient documentation

## 2018-09-28 DIAGNOSIS — Z7982 Long term (current) use of aspirin: Secondary | ICD-10-CM | POA: Insufficient documentation

## 2018-09-28 DIAGNOSIS — Z79899 Other long term (current) drug therapy: Secondary | ICD-10-CM | POA: Insufficient documentation

## 2018-09-28 DIAGNOSIS — R399 Unspecified symptoms and signs involving the genitourinary system: Secondary | ICD-10-CM

## 2018-09-28 DIAGNOSIS — Y829 Unspecified medical devices associated with adverse incidents: Secondary | ICD-10-CM | POA: Diagnosis not present

## 2018-09-28 NOTE — ED Triage Notes (Signed)
Pt reports that he had a catheter inserted Monday and cath started leaking yesterday. Has not followed up with urologist at this time

## 2018-09-28 NOTE — ED Provider Notes (Signed)
[facility] EMERGENCY DEPARTMENT Provider Note   CSN: [id number] Arrival date & time: 09/28/18  1031     History   Chief Complaint Chief Complaint  Patient presents with  . catheter leaking    HPI Jeffery [name] is a 67 y.o. male.  67 year old male presents with his daughter with complaint of a leaking Foley catheter.  Patient was seen in the emergency room 3 days ago, had Foley catheter placed for recurrent urinary retention and is on Septra for urinary tract infection.  Patient's daughter noticed urine running down the patient's leg today, patient states he thinks it is leaking from the balloon port.  Denies fevers, abdominal pain.  Patient states that he has seen blood clots at times in his bag.  Patient is scheduled to follow-up with urology, no other complaints or concerns.     Past Medical History:  Diagnosis Date  . Cancer [facility])    prostate  . Cervical spondylosis   . Glaucoma   . Hematuria    radiation cystitis  . History of migraine headaches   . Hx of arthroscopic knee surgery    Left knee  . Hyperlipidemia   . Hypertension   . Impaired fasting glucose   . Memory loss   . Progressive gait disorder   . Renal disorder    kidney stone  . Varicose veins     Patient Active Problem List   Diagnosis Date Noted  . Mild cognitive impairment 12/13/2017  . AKI (acute kidney injury) ([place]) 02/16/2017  . Hyponatremia 02/16/2017  . Hypokalemia 02/15/2017  . Syncope and collapse 12/07/2012  . Hyperthyroidism 12/07/2012  . Other malaise and fatigue 12/07/2012  . Disturbance of skin sensation 12/07/2012  . Cervical spondylosis without myelopathy 12/07/2012  . Abnormality of gait 12/07/2012  . 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 CHOLECYSTECTOMY    . EYE SURGERY    . PROSTATE SURGERY          Home Medications    Prior to Admission medications   Medication Sig Start  Date End Date Taking? Authorizing Provider  amLODipine (NORVASC) 10 MG tablet Take 1 tablet (10 mg total) by mouth daily. 05/07/16   Ward, Delice Bison, DO  aspirin EC 81 MG tablet Take 81 mg by mouth daily.    [provider]  donepezil (ARICEPT) 10 MG tablet Take 1 tablet (10 mg total) by mouth at bedtime. 12/13/17   Marcial Pacas, MD  glimepiride (AMARYL) 2 MG tablet Take 2 mg by mouth daily with breakfast.     [provider]  Insulin Detemir (LEVEMIR FLEXTOUCH) 100 UNIT/ML Pen Inject 20 Units into the skin every morning.    [provider]  memantine (NAMENDA) 10 MG tablet Take 1 tablet (10 mg total) by mouth 2 (two) times daily. 12/13/17   Marcial Pacas, MD  oxyCODONE-acetaminophen (PERCOCET/ROXICET) 5-325 MG tablet TAKE 1 TABLET BY MOUTH EVERY 4 TO 6 HOURS AS NEEDED 04/21/18   [provider]  Potassium Chloride (KLOR-CON PO) Take 10 mEq by mouth 2 (two) times daily.     [provider]  simvastatin (ZOCOR) 20 MG tablet Take 20 mg by mouth at bedtime.    [provider]  sulfamethoxazole-trimethoprim (BACTRIM DS,SEPTRA DS) 800-160 MG tablet Take 1 tablet by mouth 2 (two) times daily for 7 days. 09/25/18 10/02/18  Petrucelli, Glynda Jaeger, PA-C    Family History Family History  Problem Relation Age of Onset  . Glaucoma Mother   . Arthritis Father   . Diabetes Father   . Fibromyalgia Sister   . Gait disorder Maternal Uncle        progessive gait disorder    Social History Social History   Tobacco Use  . Smoking status: Former Research scientist (life sciences)  . Smokeless tobacco: Never Used  Substance Use Topics  . Alcohol use: Yes    Comment: 0.5 pint of wine daily  . Drug use: No     Allergies   Penicillins; Atenolol; Clonidine derivatives; Metformin and related; and Tape   Review of Systems Review of Systems  Constitutional: Negative for fever.  Gastrointestinal: Negative for abdominal distention and abdominal pain.  Genitourinary: Positive for  hematuria. Negative for decreased urine volume.  Allergic/Immunologic: Positive for immunocompromised state.  Hematological: Does not bruise/bleed easily.  Psychiatric/Behavioral: Negative for confusion.  All other systems reviewed and are negative.    Physical Exam Updated Vital Signs BP 127/66 (BP Location: Right Arm)   Pulse 76   Temp 98.3 F (36.8 C) (Oral)   Resp 11   Ht 6\' 2"  (1.88 m)   Wt 102.5 kg   SpO2 96%   BMI 29.01 kg/m   Physical Exam  Constitutional: He is oriented to person, place, and time. He appears well-developed and well-nourished. No distress.  HENT:  Head: Normocephalic and atraumatic.  Cardiovascular: Intact distal pulses.  Pulmonary/Chest: Effort normal.  Abdominal: Soft. He exhibits no distension. There is no tenderness.  Neurological: He is alert and oriented to person, place, and time.  Skin: Skin is warm and dry. No rash noted. He is not diaphoretic.  Psychiatric: He has a normal mood and affect. His behavior is normal.  Nursing note and vitals reviewed.    ED Treatments / Results  Labs (all labs ordered are listed, but only abnormal results are displayed) Labs Reviewed - No data to display  EKG None  Radiology No results found.  Procedures Procedures (including critical care time)  Medications Ordered in ED Medications - No data to display   Initial Impression / Assessment and Plan / ED Course  I have reviewed the triage vital signs and the nursing notes.  Pertinent labs & imaging results that were available during my care of the patient were reviewed by me and considered in my medical decision making (see chart for details).  Clinical Course as of Sep 28 1216  Thu Sep 28, 6574  1714 67 year old male with concern for a leaking Foley catheter.  Catheter was evaluated and irrigated by nursing staff, no leaking present.  Patient is satisfied with this and ready for discharge.   [LM]    Clinical Course User Index [LM] Tacy Learn, PA-C   Final Clinical Impressions(s) / ED Diagnoses   Final diagnoses:  None    ED Discharge Orders    None       Tacy Learn, PA-C 09/28/18 1217    Julianne Rice, MD 09/29/18 [phone]

## 2018-09-28 NOTE — ED Notes (Signed)
New leg bag placed.  No leakage noticed.  Pt's catheter flushed and patent. No blood clots seen or resistance. PA notified

## 2018-10-02 ENCOUNTER — Telehealth: Payer: Self-pay | Admitting: Emergency Medicine

## 2018-10-02 NOTE — Telephone Encounter (Signed)
Lost to followup 

## 2018-10-25 IMAGING — DX DG ABDOMEN 1V
1 series · 1 of 1 positions shown · non-contrast
Comparison: Abdominopelvic CT scan March 21, 2018

CLINICAL DATA: Constipation and urinary retention

EXAM:
ABDOMEN - 1 VIEW

[abdomen kub]
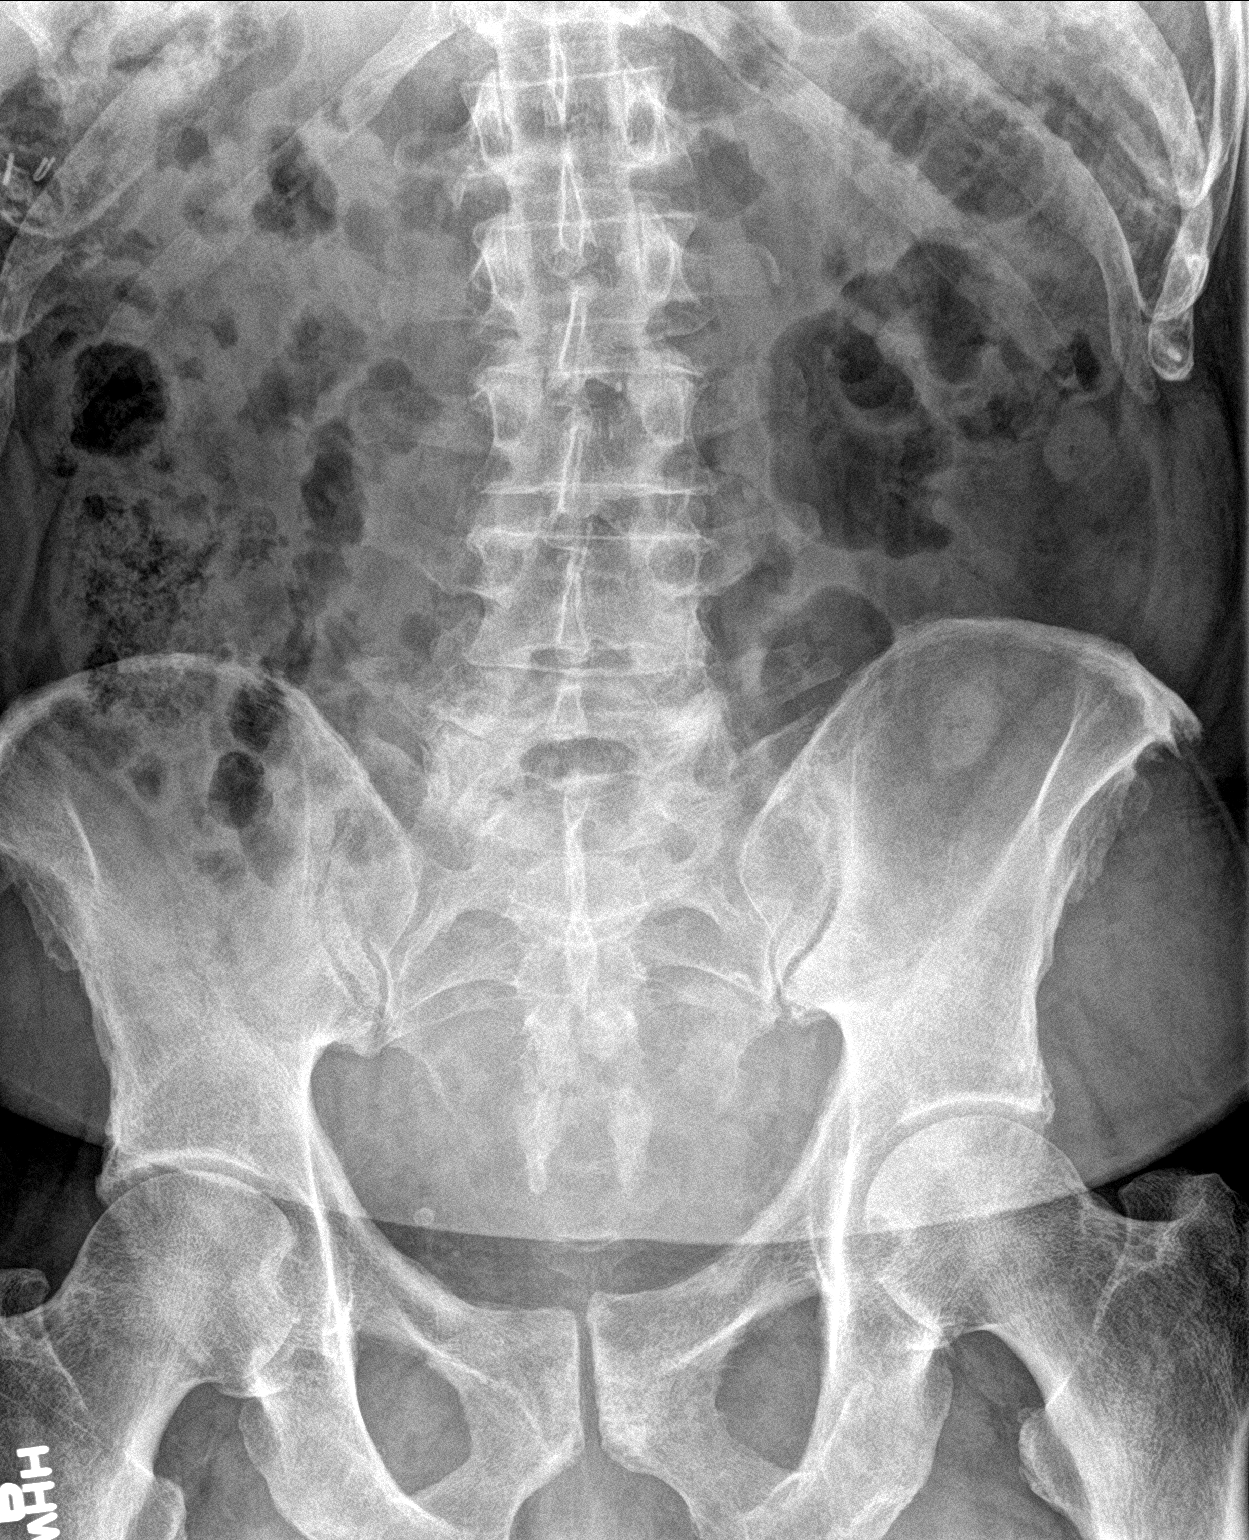

[1 of 1 positions shown; findings below may reference images not displayed]

FINDINGS: The colonic stool burden is increased. The rectal stool burden does
not appear increased. There small amounts of gas within small and
large bowel loops. There is no evidence of obstruction. No abnormal
calcifications are observed. The bony structures exhibit no acute
abnormalities.
IMPRESSION: Moderately increased colonic stool burden likely reflects
constipation. No evidence of bowel obstruction.

Degenerative facet joint changes at L5-S1.

## 2018-12-04 ENCOUNTER — Emergency Department (HOSPITAL_COMMUNITY)
Admission: EM | Admit: 2018-12-04 | Discharge: 2018-12-05 | Disposition: A | Discharge status: Home / Self Care | Payer: Medicare Other | Attending: Emergency Medicine | Admitting: Emergency Medicine

## 2018-12-04 ENCOUNTER — Encounter (HOSPITAL_COMMUNITY): Payer: Self-pay | Admitting: *Deleted

## 2018-12-04 ENCOUNTER — Other Ambulatory Visit: Payer: Self-pay

## 2018-12-04 DIAGNOSIS — Z79899 Other long term (current) drug therapy: Secondary | ICD-10-CM | POA: Insufficient documentation

## 2018-12-04 DIAGNOSIS — R310 Gross hematuria: Secondary | ICD-10-CM | POA: Insufficient documentation

## 2018-12-04 DIAGNOSIS — R339 Retention of urine, unspecified: Secondary | ICD-10-CM | POA: Insufficient documentation

## 2018-12-04 DIAGNOSIS — Z87891 Personal history of nicotine dependence: Secondary | ICD-10-CM | POA: Insufficient documentation

## 2018-12-04 DIAGNOSIS — I10 Essential (primary) hypertension: Secondary | ICD-10-CM | POA: Diagnosis not present

## 2018-12-04 DIAGNOSIS — E0590 Thyrotoxicosis, unspecified without thyrotoxic crisis or storm: Secondary | ICD-10-CM | POA: Insufficient documentation

## 2018-12-04 LAB — URINALYSIS, ROUTINE W REFLEX MICROSCOPIC
Bilirubin Urine: NEGATIVE
Glucose, UA: >=500 mg/dL — AB
Ketones, ur: NEGATIVE mg/dL
Nitrite: NEGATIVE
PH: 6 (ref 5.0–8.0)
Protein, ur: 100 mg/dL — AB
RBC / HPF: >50 RBC/hpf — ABNORMAL HIGH (ref 0–5)
Specific Gravity, Urine: 1.014 (ref 1.005–1.030)
WBC, UA: >50 WBC/hpf — ABNORMAL HIGH (ref 0–5)

## 2018-12-04 MED ORDER — CIPROFLOXACIN HCL 500 MG PO TABS: 500.0000 mg | ORAL_TABLET | Freq: Two times a day (BID) | ORAL | 0 refills | Status: DC

## 2018-12-04 MED ORDER — CIPROFLOXACIN HCL 250 MG PO TABS
500.0000 mg | ORAL_TABLET | Freq: Once | ORAL | Status: AC
  Administered 2018-12-04: 500 mg via ORAL
  Filled 2018-12-04: qty 2

## 2018-12-04 NOTE — ED Triage Notes (Signed)
Pt c/o not being able to urinate, unable to state when he was last able to urinate,

## 2018-12-04 NOTE — ED Notes (Signed)
Bladder Scan reads 40mL

## 2018-12-04 NOTE — ED Provider Notes (Signed)
[facility] EMERGENCY DEPARTMENT Provider Note   CSN: [id number] Arrival date & time: 12/04/18  2107     History   Chief Complaint Chief Complaint  Patient presents with  . Urinary Retention    HPI Jeffery [name] is a 68 y.o. male.  HPI Patient developed difficulty urinating this afternoon.  States he feels he needs to urinate but cannot.  Denies nausea or vomiting.  Denies constipation.  No fever or chills. Past Medical History:  Diagnosis Date  . Cancer [facility])    prostate  . Cervical spondylosis   . Glaucoma   . Hematuria    radiation cystitis  . History of migraine headaches   . Hx of arthroscopic knee surgery    Left knee  . Hyperlipidemia   . Hypertension   . Impaired fasting glucose   . Memory loss   . Progressive gait disorder   . Renal disorder    kidney stone  . Varicose veins     Patient Active Problem List   Diagnosis Date Noted  . Mild cognitive impairment 12/13/2017  . AKI (acute kidney injury) ([place]) 02/16/2017  . Hyponatremia 02/16/2017  . Hypokalemia 02/15/2017  . Syncope and collapse 12/07/2012  . Hyperthyroidism 12/07/2012  . Other malaise and fatigue 12/07/2012  . Disturbance of skin sensation 12/07/2012  . Cervical spondylosis without myelopathy 12/07/2012  . Abnormality of gait 12/07/2012  . 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 CHOLECYSTECTOMY    . EYE SURGERY    . PROSTATE SURGERY          Home Medications    Prior to Admission medications   Medication Sig Start Date End Date Taking? Authorizing Provider  amLODipine (NORVASC) 10 MG tablet Take 1 tablet (10 mg total) by mouth daily. 05/07/16  Yes Ward, Delice Bison, DO  Insulin Detemir (LEVEMIR FLEXTOUCH) 100 UNIT/ML Pen Inject 20-30 Units into the skin every morning.    Yes [provider]  memantine (NAMENDA) 10 MG tablet Take 1 tablet (10 mg total) by mouth 2 (two) times  daily. Patient taking differently: Take 10 mg by mouth daily.  12/13/17  Yes Marcial Pacas, MD  simvastatin (ZOCOR) 20 MG tablet Take 20 mg by mouth at bedtime.   Yes [provider]  ciprofloxacin (CIPRO) 500 MG tablet Take 1 tablet (500 mg total) by mouth 2 (two) times daily. One po bid x 7 days 12/04/18   Julianne Rice, MD  donepezil (ARICEPT) 10 MG tablet Take 1 tablet (10 mg total) by mouth at bedtime. Patient not taking: Reported on 12/04/2018 12/13/17   Marcial Pacas, MD    Family History Family History  Problem Relation Age of Onset  . Glaucoma Mother   . Arthritis Father   . Diabetes Father   . Fibromyalgia Sister   . Gait disorder Maternal Uncle        progessive gait disorder    Social History Social History   Tobacco Use  . Smoking status: Former Research scientist (life sciences)  . Smokeless tobacco: Never Used  Substance Use Topics  . Alcohol use: Yes    Comment: 0.5 pint of wine daily  . Drug use: No     Allergies   Penicillins; Atenolol; Clonidine derivatives; Metformin and related; and Tape   Review of Systems Review of Systems  Constitutional: Negative for chills and fever.  Respiratory: Negative for cough and shortness of breath.   Cardiovascular: Negative for  chest pain.  Gastrointestinal: Positive for abdominal pain. Negative for constipation, diarrhea, nausea and vomiting.  Genitourinary: Positive for difficulty urinating. Negative for flank pain, frequency, hematuria, penile pain, penile swelling, scrotal swelling and testicular pain.  Musculoskeletal: Negative for back pain, myalgias and neck pain.  Skin: Negative for rash and wound.  Neurological: Negative for dizziness, weakness, light-headedness, numbness and headaches.  All other systems reviewed and are negative.    Physical Exam Updated Vital Signs BP 111/76 (BP Location: Right Arm)   Pulse 93   Temp 97.7 F (36.5 C) (Oral)   Resp 16   Ht 6\' 2"  (1.88 m)   Wt 90.7 kg   SpO2 95%   BMI 25.68 kg/m    Physical Exam Vitals signs and nursing note reviewed.  Constitutional:      Appearance: Normal appearance. He is well-developed.  HENT:     Head: Normocephalic and atraumatic.     Mouth/Throat:     Mouth: Mucous membranes are moist.  Eyes:     Pupils: Pupils are equal, round, and reactive to light.  Neck:     Musculoskeletal: Normal range of motion and neck supple.  Cardiovascular:     Rate and Rhythm: Normal rate and regular rhythm.  Pulmonary:     Effort: Pulmonary effort is normal.     Breath sounds: Normal breath sounds.  Abdominal:     General: Bowel sounds are normal.     Palpations: Abdomen is soft.     Tenderness: There is abdominal tenderness. There is no right CVA tenderness, left CVA tenderness, guarding or rebound.     Comments: Mild suprapubic fullness and tenderness.  No rebound or guarding.  Genitourinary:    Comments: No hernias appreciated.  No testicular swelling or pain. Musculoskeletal: Normal range of motion.        General: No swelling, tenderness, deformity or signs of injury.     Comments: No CVA tenderness bilaterally.  Skin:    General: Skin is warm and dry.     Findings: No erythema or rash.  Neurological:     General: No focal deficit present.     Mental Status: He is alert and oriented to person, place, and time.  Psychiatric:        Mood and Affect: Mood normal.        Behavior: Behavior normal.      ED Treatments / Results  Labs (all labs ordered are listed, but only abnormal results are displayed) Labs Reviewed  URINALYSIS, ROUTINE W REFLEX MICROSCOPIC - Abnormal; Notable for the following components:      Result Value   Color, Urine AMBER (*)    APPearance CLOUDY (*)    Glucose, UA >=500 (*)    Hgb urine dipstick LARGE (*)    Protein, ur 100 (*)    Leukocytes, UA MODERATE (*)    RBC / HPF >50 (*)    WBC, UA >50 (*)    Bacteria, UA RARE (*)    All other components within normal limits  URINE CULTURE     EKG None  Radiology No results found.  Procedures Procedures (including critical care time)  Medications Ordered in ED Medications  ciprofloxacin (CIPRO) tablet 500 mg (has no administration in time range)     Initial Impression / Assessment and Plan / ED Course  I have reviewed the triage vital signs and the nursing notes.  Pertinent labs & imaging results that were available during my care of the patient were  reviewed by me and considered in my medical decision making (see chart for details).     Foley catheter placed.  Patient states his symptoms have completely resolved.  Abdominal exam is benign.  Mild grossly bloody urine produced.  Moderate leukocytes and greater than 50 white blood cells.  Urine was sent for culture and will start on antibiotics.  Patient is advised to follow-up with urology and strict return precautions given.  Final Clinical Impressions(s) / ED Diagnoses   Final diagnoses:  Urinary retention  Gross hematuria    ED Discharge Orders         Ordered    ciprofloxacin (CIPRO) 500 MG tablet  2 times daily     12/04/18 2312           Julianne Rice, MD 12/04/18 2313

## 2018-12-05 NOTE — ED Notes (Signed)
Pt insisted on taking the leg bag home, states "I have switched them out before and know how to do it"

## 2018-12-07 LAB — URINE CULTURE: Culture: >=100000 — AB

## 2018-12-08 ENCOUNTER — Telehealth: Payer: Self-pay | Admitting: Emergency Medicine

## 2018-12-08 NOTE — Telephone Encounter (Incomplete)
Post ED Visit - Positive Culture Follow-up: Unsuccessful Patient Follow-up  Culture assessed and recommendations reviewed by:  []  Elenor Quinones, Pharm.D. []  Heide Guile, Pharm.D., BCPS AQ-ID []  Parks Neptune, Pharm.D., BCPS []  Alycia Rossetti, Pharm.D., BCPS []  [place], Pharm.D., BCPS, AAHIVP []  Legrand Como, Pharm.D., BCPS, AAHIVP []  Wynell Balloon, PharmD [x]  Harrietta Guardian, PharmD, BCPS  Positive *** culture  []  Patient discharged without antimicrobial prescription and treatment is now indicated [x]  Organism is resistant to prescribed ED discharge antimicrobial []  Patient with positive blood cultures   Unable to contact patient by phone, letter will be sent to address on file  Georgina Peer 12/08/2018, 11:53 AM

## 2018-12-24 ENCOUNTER — Other Ambulatory Visit: Payer: Self-pay | Admitting: Neurology

## 2019-03-15 ENCOUNTER — Telehealth: Payer: Self-pay | Admitting: Neurology

## 2019-03-15 NOTE — Telephone Encounter (Signed)
Nishika daughter of the pt on DPR states the pts dementia getting worse and is needing to be seen. Please advise.

## 2019-03-15 NOTE — Telephone Encounter (Signed)
I returned the call to her patient's daughter (on [place]). States her father's memory is getting worse and she would like for him to be reevaluated.  He has been prescribed donepezil 10mg  daily and memantine 10mg  BID but does not take his medications regularly.  He has been scheduled for a virtual visit on 03/19/19 with Butler Denmark, NP.  He will have family members present with him during this visit.

## 2019-03-16 NOTE — Progress Notes (Signed)
Virtual Visit via Video Note  I connected with Jeffery [name] on 03/19/19 at  8:45 AM EDT by a video enabled telemedicine application and verified that I am speaking with the correct person using two identifiers.   I discussed the limitations of evaluation and management by telemedicine and the availability of in person appointments. The patient expressed understanding and agreed to proceed.  History of Present Illness: Jeffery [name] is a 68 year old male seen in refer by his primary care Dr. Hermine Messick for evaluation of gradual onset memory loss, initial evaluation was on December 13, 2017.  He is accompanied by his wife Jeffery [name],  I reviewed and summarized the referring note, he has past medical history of hypertension, hyperlipidemia, prostate cancer, abnormal glucose level, take Metformin.  Is a retired Administrator, lives with his wife,  He was noted to have gradual onset memory loss over the past few years, especially since March 2018, he tends to repeat things, he also drank wine on a daily basis, watches TV all day long, not sleeping well, per wife, is also irritable,  His father suffered dementia.  CT head of the brain in March 2018 no acute abnormality  Laboratory evaluations in July 2018, creatinine was 1.03, A1c was 6.7, elevated glucose 126, normal TSH 2.1,  UPDATE March 13 2018: Laboratory evaluation showed normal negative HIV, RPR, B12, UDS, alcohol level was 284 in March 2019,  His father has dementia.  He continues to drink twice daily, I have personally reviewed MRI of the brain, generalized atrophy, supratentorium small vessel disease.  Update March 19 2019 SS: Last MMSE 02/2018 26/30, daily alcohol use, namenda 10 mg BID, Aricept 10 mg daily.   I talked with Mr. Jeffery [name] and his wife, apparently over the last several months they have noticed a decline in his memory, he has been hiding things, putting things where people cannot find them, he reports he has been doing  this and nobody will know where he puts things, he continues to drink wine/beer during the day, " I drink what I want", his wife reports he drinks alcohol throughout the day, he does not drive a car, apparently he will ride the lawnmower to the grocery store, last week he got lost, his wife had to pick him up, he does not manage his finances, he is able to perform ADLs,, his appetite comes and goes, he is on insulin for his diabetes, manages his medications with his wife's reminders, he refuses to take Namenda or Aricept, his wife reports that he has been talking to people, seeing people that are not there, few weeks ago he cooked a meal for a bunch of people, but it was only he and his wife, he became upset during this visit talking about his memory, he left the room, during the day his he is alone while his wife works, his son checks on him; he has had 3 urinary tract infections this year, has had procedure on his bladder due to bleeding, had hyperbaric treatment, reports bladder is healed. He drinks at least 6 pack of beer, he will drink a pint of wine in the store on the way home, 2-3 pints of wine at least per day. He has a high tolerance.  Apparently he will walk, or drive the lawn mower to the grocery store every day to buy alcohol. His wife does report good support system with daughter and son nearby.   Observations/Objective: Alert, became upset that exam discussing memory, alcohol consumption,  refused to take memory test or perform exam  Assessment and Plan: 1.  Memory disturbance  He refuses to take Namenda or Aricept, he has had a decline in his memory over the last 3 to 4 months. He refused memory test today. He should not drive a car, I suggest taking the keys to lawnmower. He should try to cut back on alcohol, is likely playing key role in memory/behavior problems. He likely needs an alcohol detox program, possibly inpatient. If needed, [place] emergency department for psychiatric  management/detox. I suggested they discuss with primary care. We discussed he has had several UTIs in the past few months, they hasn't been an acute change, could try to take a sample to his primary care doctor.  I talked with Dr. Krista Blue about this case.  Given his worsening memory problems, agitation, hallucinations we could potentially try Seroquel, Wellbutrin, Lamictal.  However none of these are great choices given his alcohol abuse.  He is not willing to take his current medications.  These medications could potentially interact with alcohol.  His wife does report that in the past he has gone through detox 1 time, was able to stay alcohol free for 2 years.  Jeffery [name] has a trusting relationship with his primary care doctor, Dr. Willey Blade.  His wife will contact Dr. Willey Blade to discuss these issues, see if he may be able to discuss the situation with Jeffery [name].  We discussed the need to monitor his safety, her safety, utilizing the emergency department if needed.  She will keep Jeffery [name] up-to-date and let Jeffery [name] know how we can help.  I will send in a prescription for thiamine 100 mg daily.   Follow Up Instructions:   3 months for revisit    I discussed the assessment and treatment plan with the patient. The patient was provided an opportunity to ask questions and all were answered. The patient agreed with the plan and demonstrated an understanding of the instructions.   The patient was advised to call back or seek an in-person evaluation if the symptoms worsen or if the condition fails to improve as anticipated.  I provided 30 minutes of non-face-to-face time during this encounter.   Evangeline Dakin, DNP  [facility] [address], [place], [place] [address] [phone] ;

## 2019-03-19 ENCOUNTER — Other Ambulatory Visit: Payer: Self-pay

## 2019-03-19 ENCOUNTER — Ambulatory Visit (INDEPENDENT_AMBULATORY_CARE_PROVIDER_SITE_OTHER): Payer: Medicare Other | Admitting: Neurology

## 2019-03-19 ENCOUNTER — Encounter: Payer: Self-pay | Admitting: Neurology

## 2019-03-19 DIAGNOSIS — G3184 Mild cognitive impairment, so stated: Secondary | ICD-10-CM | POA: Diagnosis not present

## 2019-03-19 MED ORDER — VITAMIN B-1 100 MG PO TABS: 100.0000 mg | ORAL_TABLET | Freq: Every day | ORAL | 1 refills | Status: DC

## 2019-03-19 NOTE — Progress Notes (Signed)
I have reviewed and agreed above plan. 

## 2019-04-12 ENCOUNTER — Telehealth: Payer: Self-pay | Admitting: Neurology

## 2019-04-12 NOTE — Telephone Encounter (Signed)
I left a voicemail for the patient to call back about making a 3 month follow up.

## 2019-06-18 NOTE — Progress Notes (Signed)
PATIENT: Jeffery [name] DOB: [birth year]  REASON FOR VISIT: follow up HISTORY FROM: patient  HISTORY OF PRESENT ILLNESS: Today 06/19/19  HISTORY  Jeffery [name] a 67 year old male seen in refer by his primary care Dr. Hermine Messick for evaluation of gradual onset memory loss, initial evaluation was on December 13, 2017. He is accompanied by his wife Jeffery [name],  I reviewed and summarized the referring note, he has past medical history of hypertension, hyperlipidemia, prostate cancer, abnormal glucose level, take Metformin.  Is a retired Administrator, lives with his wife,  He was noted to have gradual onset memory loss over the past few years, especially since March 2018, he tends to repeat things, he also drankwineon a daily basis, watches TV all day long, not sleeping well, per wife, is also irritable,  His father suffered dementia.  CT head of the brain in March 2018 no acute abnormality  Laboratory evaluations in July 2018, creatinine was 1.03, A1c was 6.7, elevated glucose 126, normal TSH 2.1,  UPDATE March 13 2018: Laboratory evaluation showed normal negative HIV, RPR, B12,UDS, alcohol level was 284in March 2019,  His father has dementia.He continues to drink twice daily, I have personally reviewed MRI of the brain, generalized atrophy, supratentorium small vessel disease.  Update March 19 2019 SS: Last MMSE 02/2018 26/30, daily alcohol use, namenda 10 mg BID, Aricept 10 mg daily.   I talked with Mr. Jeffery [name] and his wife, apparently over the last several months they have noticed a decline in his memory, he has been hiding things, putting things where people cannot find them, he reports he has been doing this and nobody will know where he puts things, he continues to drink wine/beer during the day, " I drink what I want", his wife reports he drinks alcohol throughout the day, he does not drive a car, apparently he will ride the lawnmower to the grocery store, last week  he got lost, his wife had to pick him up, he does not manage his finances, he is able to perform ADLs,, his appetite comes and goes, he is on insulin for his diabetes, manages his medications with his wife's reminders, he refuses to take Namenda or Aricept, his wife reports that he has been talking to people, seeing people that are not there, few weeks ago he cooked a meal for a bunch of people, but it was only he and his wife, he became upset during this visit talking about his memory, he left the room, during the day his he is alone while his wife works, his son checks on him; he has had 3 urinary tract infections this year, has had procedure on his bladder due to bleeding, had hyperbaric treatment, reports bladder is healed. He drinks at least 6 pack of beer, he will drink a pint of wine in the store on the way home, 2-3 pints of wine at least per day. He has a high tolerance.  Apparently he will walk, or drive the lawn mower to the grocery store every day to buy alcohol. His wife does report good support system with daughter and son nearby.  Update June 19, 2019 SS: He is here with his wife today, they indicate things have been going better at home.  He appears to be in a good mood.  He is able to perform his ADLs at home, has a good appetite, does some cooking.  His wife does manage their finances.  He is able to stay at home  alone while she goes to work.  He does not drive a car.  He is no longer driving a lawnmower.  Sometimes he may stay in the bed all day.  He reports this does not occur often, and that he worked his whole life and he may be tired for a day.  His wife lays out his medications and he takes them.  During the day, he will call his wife and check in.  He says he continues to drink alcohol, 1 beer a day, maybe none on some days.  He does self catheterize himself occasionally if needed.  He was taken off his blood pressure medication amlodipine due to swelling in his legs. Manual recheck of BP  120/80.  REVIEW OF SYSTEMS: Out of a complete 14 system review of symptoms, the patient complains only of the following symptoms, and all other reviewed systems are negative.  Memory disorder  ALLERGIES: Allergies  Allergen Reactions  . Penicillins Hives, Nausea And Vomiting and Swelling    Swelling of lips Has patient had a PCN reaction causing immediate rash, facial/tongue/throat swelling, SOB or lightheadedness with hypotension: Yes Has patient had a PCN reaction causing severe rash involving mucus membranes or skin necrosis: No Has patient had a PCN reaction that required hospitalization No Has patient had a PCN reaction occurring within the last 10 years: No If all of the above answers are "NO", then may proceed with Cephalosporin use.   . Atenolol Swelling and Other (See Comments)    Angioedema   . Clonidine Derivatives Swelling and Other (See Comments)    Angioedema   . Metformin And Related     Irritable   . Tape Rash    No plastic, "patterned" tape!!    HOME MEDICATIONS: Outpatient Medications Prior to Visit  Medication Sig Dispense Refill  . Insulin Detemir (LEVEMIR FLEXTOUCH) 100 UNIT/ML Pen Inject 20-30 Units into the skin every morning.     . simvastatin (ZOCOR) 20 MG tablet Take 20 mg by mouth at bedtime.    . donepezil (ARICEPT) 10 MG tablet Take one tablet at bedtime.  Please call [phone] to schedule an appt. 90 tablet 0  . memantine (NAMENDA) 10 MG tablet Take 1 tablet (10 mg total) by mouth 2 (two) times daily. Please call [phone] to schedule an appt. 180 tablet 0  . glimepiride (AMARYL) 2 MG tablet Take 2 mg by mouth every morning.    Marland Kitchen KLOR-CON M20 20 MEQ tablet TAKE 1 TABLET THREE TIMES DAILY FOR 3 DAYS, THEN 1 TABLET DAILY    . amLODipine (NORVASC) 10 MG tablet Take 1 tablet (10 mg total) by mouth daily. 30 tablet 1  . thiamine (VITAMIN B-1) 100 MG tablet Take 1 tablet (100 mg total) by mouth daily. 90 tablet 1   No facility-administered medications  prior to visit.     PAST MEDICAL HISTORY: Past Medical History:  Diagnosis Date  . Cancer [facility])    prostate  . Cervical spondylosis   . Glaucoma   . Hematuria    radiation cystitis  . History of migraine headaches   . Hx of arthroscopic knee surgery    Left knee  . Hyperlipidemia   . Hypertension   . Impaired fasting glucose   . Memory loss   . Progressive gait disorder   . Renal disorder    kidney stone  . Varicose veins     PAST SURGICAL HISTORY: Past Surgical History:  Procedure Laterality Date  . CHOLECYSTECTOMY    .  EYE SURGERY    . PROSTATE SURGERY      FAMILY HISTORY: Family History  Problem Relation Age of Onset  . Glaucoma Mother   . Arthritis Father   . Diabetes Father   . Fibromyalgia Sister   . Gait disorder Maternal Uncle        progessive gait disorder    SOCIAL HISTORY: Social History   Socioeconomic History  . Marital status: Married    Spouse name: Not on file  . Number of children: 2  . Years of education: HS  . Highest education level: Not on file  Occupational History  . Occupation: Information systems manager  Social Needs  . Financial resource strain: Not on file  . Food insecurity    Worry: Not on file    Inability: Not on file  . Transportation needs    Medical: Not on file    Non-medical: Not on file  Tobacco Use  . Smoking status: Former Research scientist (life sciences)  . Smokeless tobacco: Never Used  Substance and Sexual Activity  . Alcohol use: Yes    Comment: 0.5 pint of wine daily  . Drug use: No  . Sexual activity: Not on file  Lifestyle  . Physical activity    Days per week: Not on file    Minutes per session: Not on file  . Stress: Not on file  Relationships  . Social Herbalist on phone: Not on file    Gets together: Not on file    Attends religious service: Not on file    Active member of club or organization: Not on file    Attends meetings of clubs or organizations: Not on file    Relationship status: Not on  file  . Intimate partner violence    Fear of current or ex partner: Not on file    Emotionally abused: Not on file    Physically abused: Not on file    Forced sexual activity: Not on file  Other Topics Concern  . Not on file  Social History Narrative   Lives at home with his wife.   Right-handed.   Occasional caffeine use.      PHYSICAL EXAM  Vitals:   06/19/19 0931  BP: (!) 99/59  Pulse: 89  Temp: 97.7 F (36.5 C)   There is no height or weight on file to calculate BMI.  Generalized: Well developed, in no acute distress  MMSE - Mini Mental State Exam 06/19/2019 03/13/2018 12/13/2017  Orientation to time 1 4 3   Orientation to Place 4 5 5   Registration 3 3 3   Attention/ Calculation 1 5 5   Recall 0 0 0  Language- name 2 objects 2 2 2   Language- repeat 1 1 1   Language- follow 3 step command 3 3 3   Language- read & follow direction 0 1 1  Write a sentence 1 1 1   Copy design 1 1 1   Copy design-comments named 7 animals - -  Total score 17 26 25     Neurological examination  Mentation: Alert to year, able to provide 50% of history. Follows all commands speech and language fluent Cranial nerve II-XII: Pupils were equal round reactive to light. Extraocular movements were full, visual field were full on confrontational test. Facial sensation and strength were normal. Uvula tongue midline. Head turning and shoulder shrug  were normal and symmetric. Motor: The motor testing reveals 5 over 5 strength of all 4 extremities. Good symmetric motor tone is noted  throughout.  Sensory: Sensory testing is intact to soft touch on all 4 extremities. No evidence of extinction is noted.  Coordination: Cerebellar testing reveals good finger-nose-finger and heel-to-shin bilaterally.  Gait and station: Gait is normal. Tandem gait is normal.   Reflexes: Deep tendon reflexes are symmetric and normal bilaterally.   DIAGNOSTIC DATA (LABS, IMAGING, TESTING) - I reviewed patient records, labs, notes,  testing and imaging myself where available.  Lab Results  Component Value Date   WBC 4.1 06/14/2018   HGB 7.4 (L) 06/14/2018   HCT 24.0 (L) 06/14/2018   MCV 92.3 06/14/2018   PLT 154 06/14/2018      Component Value Date/Time   NA 136 06/14/2018 0541   K 4.2 06/14/2018 0541   CL 106 06/14/2018 0541   CO2 21 (L) 06/14/2018 0541   GLUCOSE 414 (H) 06/14/2018 0541   BUN 9 06/14/2018 0541   CREATININE 1.14 06/14/2018 0541   CALCIUM 8.5 (L) 06/14/2018 0541   PROT 8.9 (H) 03/21/2018 1911   ALBUMIN 4.4 03/21/2018 1911   AST 35 03/21/2018 1911   ALT 23 03/21/2018 1911   ALKPHOS 147 (H) 03/21/2018 1911   BILITOT 1.1 03/21/2018 1911   GFRNONAA >60 06/14/2018 0541   GFRAA >60 06/14/2018 0541   No results found for: CHOL, HDL, LDLCALC, LDLDIRECT, TRIG, CHOLHDL Lab Results  Component Value Date   HGBA1C 12.8 (H) 02/16/2017   Lab Results  Component Value Date   VITAMINB12 410 12/13/2017   No results found for: TSH  ASSESSMENT AND PLAN 68 y.o. year old male  has a past medical history of Cancer ([place]), Cervical spondylosis, Glaucoma, Hematuria, History of migraine headaches, arthroscopic knee surgery, Hyperlipidemia, Hypertension, Impaired fasting glucose, Memory loss, Progressive gait disorder, Renal disorder, and Varicose veins. here with:  Cognitive impairment -He has had a decline in his memory score, today 17/30 -Most likely central nervous system degenerative disorder, with superimposed daily alcohol use -Family history of dementia -He will continue Namenda 10 mg twice a day, Aricept 10 mg daily (he does have urinary retention, manages with occasional self-cath) -I have encouraged him to incorporate healthy eating, drinking plenty water, exercise, brain stimulating activities -Need to closely monitor his safety staying home alone while his wife is working -During the visit, things seem to be going better in the home in regards to his agitation, alcohol.  I have encouraged the  wife to contact me if there are additional issues that need to be discussed. -He will follow-up in 6 months or sooner if needed with Dr. Krista Blue  I spent 15 minutes with the patient. 50% of this time was spent discussing his plan of care   Evangeline Dakin, DNP 06/19/2019, 12:20 PM [facility] [address], [place], [place] [address] [phone]

## 2019-06-19 ENCOUNTER — Encounter: Payer: Self-pay | Admitting: Neurology

## 2019-06-19 ENCOUNTER — Other Ambulatory Visit: Payer: Self-pay

## 2019-06-19 ENCOUNTER — Ambulatory Visit (INDEPENDENT_AMBULATORY_CARE_PROVIDER_SITE_OTHER): Payer: Medicare Other | Admitting: Neurology

## 2019-06-19 VITALS — BP 99/59 | HR 89 | Temp 97.7°F

## 2019-06-19 DIAGNOSIS — R413 Other amnesia: Secondary | ICD-10-CM

## 2019-06-19 MED ORDER — DONEPEZIL HCL 10 MG PO TABS: ORAL_TABLET | ORAL | 1 refills | Status: DC

## 2019-06-19 MED ORDER — MEMANTINE HCL 10 MG PO TABS: 10.0000 mg | ORAL_TABLET | Freq: Two times a day (BID) | ORAL | 1 refills | Status: DC

## 2019-06-19 NOTE — Patient Instructions (Signed)
It was wonderful to meet you both today! Please call for any problems or concerns.

## 2019-06-19 NOTE — Progress Notes (Signed)
I have reviewed and agreed above plan. 

## 2019-06-26 ENCOUNTER — Telehealth: Payer: Self-pay | Admitting: Neurology

## 2019-06-26 NOTE — Telephone Encounter (Signed)
Can you call the patients wife to inquire about additional concerns she may have?

## 2019-06-26 NOTE — Telephone Encounter (Signed)
Pt wife(on DPR) is asking for  A call from NP Sarah to discus matters she could not discuss in the presence of the pt.  Wife states she was told by Judson Roch to call her back so they could talk

## 2019-06-27 NOTE — Telephone Encounter (Signed)
LMVM for pts wife, [name] to return call (re: concerns she has).

## 2019-06-27 NOTE — Telephone Encounter (Signed)
I called the patient's wife in response to alcohol.  I discussed with Dr. Krista Blue offered Celexa 10 mg daily to help with agitation, paranoia.  We discussed side effects of medication.  After discussion, she decided not to start anything.  The patient is still drinking.  There is not a perfect solution. I did suggest she remove any guns from the home, or keep them locked and secure the key.  She will call if she changes her mind or if symptoms worsen.  I also suggest she continue close follow-up with Dr. Willey Blade, primary doctor.

## 2019-06-27 NOTE — Telephone Encounter (Signed)
I called wife back at [phone].  She wanted to relay that pt did not answer her questions truthfully.  He is drinking daily (6 pk or more beer and wine (higher %alcohol).  He is forgetful.  Memory loss worsened even on medication.  Has hallucinations, paranoia (hides his things), gets defensive, agitated.  He walks to convenience store (1-2 miles) away and gets alcohol.  Has rifles in home (recommended to keep locked or store at another location - locked).  She stated she was going to ask family member to take away.  Pt not interested in AA or rehab.  I recommended AA for family members to cope.  Situation more difficult since drinks but also has dementia.  I will mail dementia packet to her at home address.  She works, she calls and son checks on father during day as well.  He does not visit or have visitors / friends that visit him. (part of hallucinations).   Would there be any medication to assist with hallucinations, paranoia, fixation, agitation even if drinks?

## 2019-07-05 ENCOUNTER — Other Ambulatory Visit: Payer: Self-pay

## 2019-07-05 ENCOUNTER — Inpatient Hospital Stay (HOSPITAL_COMMUNITY)
Admission: EM | Admit: 2019-07-05 | Discharge: 2019-07-08 | DRG: 101 | Disposition: A | Discharge status: Home / Self Care | Payer: Medicare Other | Attending: Internal Medicine | Admitting: Internal Medicine

## 2019-07-05 ENCOUNTER — Emergency Department (HOSPITAL_COMMUNITY): Payer: Medicare Other

## 2019-07-05 ENCOUNTER — Encounter (HOSPITAL_COMMUNITY): Payer: Self-pay | Admitting: Emergency Medicine

## 2019-07-05 DIAGNOSIS — F02818 Dementia in other diseases classified elsewhere, unspecified severity, with other behavioral disturbance: Secondary | ICD-10-CM

## 2019-07-05 DIAGNOSIS — R29700 NIHSS score 0: Secondary | ICD-10-CM | POA: Diagnosis present

## 2019-07-05 DIAGNOSIS — I444 Left anterior fascicular block: Secondary | ICD-10-CM | POA: Diagnosis present

## 2019-07-05 DIAGNOSIS — Z83511 Family history of glaucoma: Secondary | ICD-10-CM

## 2019-07-05 DIAGNOSIS — N1831 Chronic kidney disease, stage 3a: Secondary | ICD-10-CM

## 2019-07-05 DIAGNOSIS — Z888 Allergy status to other drugs, medicaments and biological substances status: Secondary | ICD-10-CM

## 2019-07-05 DIAGNOSIS — G309 Alzheimer's disease, unspecified: Secondary | ICD-10-CM

## 2019-07-05 DIAGNOSIS — Z20828 Contact with and (suspected) exposure to other viral communicable diseases: Secondary | ICD-10-CM | POA: Diagnosis present

## 2019-07-05 DIAGNOSIS — Z9114 Patient's other noncompliance with medication regimen: Secondary | ICD-10-CM

## 2019-07-05 DIAGNOSIS — G8384 Todd's paralysis (postepileptic): Secondary | ICD-10-CM | POA: Diagnosis present

## 2019-07-05 DIAGNOSIS — E785 Hyperlipidemia, unspecified: Secondary | ICD-10-CM

## 2019-07-05 DIAGNOSIS — E1165 Type 2 diabetes mellitus with hyperglycemia: Secondary | ICD-10-CM

## 2019-07-05 DIAGNOSIS — F0281 Dementia in other diseases classified elsewhere with behavioral disturbance: Secondary | ICD-10-CM | POA: Diagnosis present

## 2019-07-05 DIAGNOSIS — Z880 Allergy status to penicillin: Secondary | ICD-10-CM

## 2019-07-05 DIAGNOSIS — Z794 Long term (current) use of insulin: Secondary | ICD-10-CM

## 2019-07-05 DIAGNOSIS — D638 Anemia in other chronic diseases classified elsewhere: Secondary | ICD-10-CM | POA: Diagnosis present

## 2019-07-05 DIAGNOSIS — Z79899 Other long term (current) drug therapy: Secondary | ICD-10-CM

## 2019-07-05 DIAGNOSIS — Z87891 Personal history of nicotine dependence: Secondary | ICD-10-CM

## 2019-07-05 DIAGNOSIS — Z923 Personal history of irradiation: Secondary | ICD-10-CM

## 2019-07-05 DIAGNOSIS — F1010 Alcohol abuse, uncomplicated: Secondary | ICD-10-CM

## 2019-07-05 DIAGNOSIS — I10 Essential (primary) hypertension: Secondary | ICD-10-CM

## 2019-07-05 DIAGNOSIS — Z8546 Personal history of malignant neoplasm of prostate: Secondary | ICD-10-CM

## 2019-07-05 DIAGNOSIS — H409 Unspecified glaucoma: Secondary | ICD-10-CM | POA: Diagnosis present

## 2019-07-05 DIAGNOSIS — Z833 Family history of diabetes mellitus: Secondary | ICD-10-CM

## 2019-07-05 DIAGNOSIS — Y900 Blood alcohol level of less than 20 mg/100 ml: Secondary | ICD-10-CM | POA: Diagnosis present

## 2019-07-05 DIAGNOSIS — R569 Unspecified convulsions: Secondary | ICD-10-CM | POA: Diagnosis not present

## 2019-07-05 LAB — DIFFERENTIAL
Abs Immature Granulocytes: 0.03 10*3/uL (ref 0.00–0.07)
Basophils Absolute: 0 10*3/uL (ref 0.0–0.1)
Basophils Relative: 1 %
Eosinophils Absolute: 0 10*3/uL (ref 0.0–0.5)
Eosinophils Relative: 0 %
Immature Granulocytes: 1 %
Lymphocytes Relative: 32 %
Lymphs Abs: 1.7 10*3/uL (ref 0.7–4.0)
Monocytes Absolute: 0.5 10*3/uL (ref 0.1–1.0)
Monocytes Relative: 9 %
Neutro Abs: 3 10*3/uL (ref 1.7–7.7)
Neutrophils Relative %: 57 %

## 2019-07-05 LAB — RAPID URINE DRUG SCREEN, HOSP PERFORMED
Amphetamines: NOT DETECTED
Barbiturates: NOT DETECTED
Benzodiazepines: NOT DETECTED
Cocaine: NOT DETECTED
Opiates: NOT DETECTED
Tetrahydrocannabinol: NOT DETECTED

## 2019-07-05 LAB — CBC
HCT: 35.1 % — ABNORMAL LOW (ref 39.0–52.0)
Hemoglobin: 10.6 g/dL — ABNORMAL LOW (ref 13.0–17.0)
MCH: 27.2 pg (ref 26.0–34.0)
MCHC: 30.2 g/dL (ref 30.0–36.0)
MCV: 90 fL (ref 80.0–100.0)
Platelets: 223 10*3/uL (ref 150–400)
RBC: 3.9 MIL/uL — ABNORMAL LOW (ref 4.22–5.81)
RDW: 16.2 % — ABNORMAL HIGH (ref 11.5–15.5)
WBC: 5.2 10*3/uL (ref 4.0–10.5)
nRBC: 0 % (ref 0.0–0.2)

## 2019-07-05 LAB — URINALYSIS, ROUTINE W REFLEX MICROSCOPIC
Bacteria, UA: NONE SEEN
Bilirubin Urine: NEGATIVE
Glucose, UA: >=500 mg/dL — AB
Ketones, ur: NEGATIVE mg/dL
Leukocytes,Ua: NEGATIVE
Nitrite: NEGATIVE
Protein, ur: NEGATIVE mg/dL
Specific Gravity, Urine: 1.014 (ref 1.005–1.030)
pH: 5 (ref 5.0–8.0)

## 2019-07-05 LAB — SARS CORONAVIRUS 2 BY RT PCR (HOSPITAL ORDER, PERFORMED IN ~~LOC~~ HOSPITAL LAB): SARS Coronavirus 2: NEGATIVE

## 2019-07-05 LAB — PROTIME-INR
INR: 1.1 (ref 0.8–1.2)
Prothrombin Time: 14.2 seconds (ref 11.4–15.2)

## 2019-07-05 LAB — COMPREHENSIVE METABOLIC PANEL
ALT: 13 U/L (ref 0–44)
AST: 19 U/L (ref 15–41)
Albumin: 3.4 g/dL — ABNORMAL LOW (ref 3.5–5.0)
Alkaline Phosphatase: 97 U/L (ref 38–126)
Anion gap: 11 (ref 5–15)
BUN: 23 mg/dL (ref 8–23)
CO2: 19 mmol/L — ABNORMAL LOW (ref 22–32)
Calcium: 9 mg/dL (ref 8.9–10.3)
Chloride: 104 mmol/L (ref 98–111)
Creatinine, Ser: 1.52 mg/dL — ABNORMAL HIGH (ref 0.61–1.24)
GFR calc Af Amer: 54 mL/min — ABNORMAL LOW (ref >60)
GFR calc non Af Amer: 46 mL/min — ABNORMAL LOW (ref >60)
Glucose, Bld: 371 mg/dL — ABNORMAL HIGH (ref 70–99)
Potassium: 4.1 mmol/L (ref 3.5–5.1)
Sodium: 134 mmol/L — ABNORMAL LOW (ref 135–145)
Total Bilirubin: 1 mg/dL (ref 0.3–1.2)
Total Protein: 7.8 g/dL (ref 6.5–8.1)

## 2019-07-05 LAB — TROPONIN I (HIGH SENSITIVITY): Troponin I (High Sensitivity): 4 ng/L (ref <18)

## 2019-07-05 LAB — APTT: aPTT: 30 seconds (ref 24–36)

## 2019-07-05 LAB — MAGNESIUM: Magnesium: 2 mg/dL (ref 1.7–2.4)

## 2019-07-05 LAB — ETHANOL: Alcohol, Ethyl (B): <10 mg/dL (ref <10)

## 2019-07-05 LAB — CBG MONITORING, ED: Glucose-Capillary: 358 mg/dL — ABNORMAL HIGH (ref 70–99)

## 2019-07-05 LAB — PHOSPHORUS: Phosphorus: 4 mg/dL (ref 2.5–4.6)

## 2019-07-05 MED ORDER — DONEPEZIL HCL 5 MG PO TABS
10.0000 mg | ORAL_TABLET | Freq: Every day | ORAL | Status: DC
  Administered 2019-07-06 – 2019-07-07 (×2): 10 mg via ORAL
  Filled 2019-07-05 (×4): qty 1
  Filled 2019-07-05 (×2): qty 2

## 2019-07-05 MED ORDER — ONDANSETRON HCL 4 MG/2ML IJ SOLN: 4.0000 mg | Freq: Four times a day (QID) | INTRAMUSCULAR | Status: DC | PRN

## 2019-07-05 MED ORDER — GLIMEPIRIDE 2 MG PO TABS
2.0000 mg | ORAL_TABLET | Freq: Every day | ORAL | Status: DC
  Administered 2019-07-06: 2 mg via ORAL
  Filled 2019-07-05: qty 1

## 2019-07-05 MED ORDER — ATORVASTATIN CALCIUM 40 MG PO TABS
40.0000 mg | ORAL_TABLET | Freq: Every day | ORAL | Status: DC
  Administered 2019-07-06 – 2019-07-07 (×2): 40 mg via ORAL
  Filled 2019-07-05 (×2): qty 1

## 2019-07-05 MED ORDER — POLYETHYLENE GLYCOL 3350 17 G PO PACK: 17.0000 g | PACK | Freq: Every day | ORAL | Status: DC | PRN

## 2019-07-05 MED ORDER — LEVETIRACETAM IN NACL 1000 MG/100ML IV SOLN
1000.0000 mg | Freq: Once | INTRAVENOUS | Status: AC
  Administered 2019-07-05: 1000 mg via INTRAVENOUS

## 2019-07-05 MED ORDER — ONDANSETRON HCL 4 MG PO TABS: 4.0000 mg | ORAL_TABLET | Freq: Four times a day (QID) | ORAL | Status: DC | PRN

## 2019-07-05 MED ORDER — ENOXAPARIN SODIUM 40 MG/0.4ML ~~LOC~~ SOLN
40.0000 mg | SUBCUTANEOUS | Status: DC
  Administered 2019-07-06 – 2019-07-07 (×2): 40 mg via SUBCUTANEOUS
  Filled 2019-07-05 (×2): qty 0.4

## 2019-07-05 MED ORDER — INSULIN DETEMIR 100 UNIT/ML ~~LOC~~ SOLN
30.0000 [IU] | Freq: Every day | SUBCUTANEOUS | Status: DC
  Filled 2019-07-05 (×4): qty 0.3

## 2019-07-05 MED ORDER — ACETAMINOPHEN 650 MG RE SUPP: 650.0000 mg | Freq: Four times a day (QID) | RECTAL | Status: DC | PRN

## 2019-07-05 MED ORDER — LORAZEPAM 2 MG/ML IJ SOLN
INTRAMUSCULAR | Status: AC
  Filled 2019-07-05: qty 1

## 2019-07-05 MED ORDER — SODIUM CHLORIDE 0.9 % IV SOLN
INTRAVENOUS | Status: AC
  Administered 2019-07-06: 04:00:00 via INTRAVENOUS

## 2019-07-05 MED ORDER — GADOBUTROL 1 MMOL/ML IV SOLN
10.0000 mL | Freq: Once | INTRAVENOUS | Status: AC | PRN
  Administered 2019-07-05: 10 mL via INTRAVENOUS

## 2019-07-05 MED ORDER — ACETAMINOPHEN 325 MG PO TABS: 650.0000 mg | ORAL_TABLET | Freq: Four times a day (QID) | ORAL | Status: DC | PRN

## 2019-07-05 MED ORDER — LEVETIRACETAM IN NACL 1000 MG/100ML IV SOLN
INTRAVENOUS | Status: AC
  Filled 2019-07-05: qty 100

## 2019-07-05 MED ORDER — LORAZEPAM 2 MG/ML IJ SOLN
0.5000 mg | Freq: Once | INTRAMUSCULAR | Status: AC
  Administered 2019-07-05: 0.5 mg via INTRAVENOUS

## 2019-07-05 MED ORDER — LORAZEPAM 2 MG/ML IJ SOLN
1.0000 mg | INTRAMUSCULAR | Status: AC | PRN
  Administered 2019-07-06 (×2): 1 mg via INTRAVENOUS
  Filled 2019-07-05 (×3): qty 1

## 2019-07-05 MED ORDER — LEVETIRACETAM IN NACL 500 MG/100ML IV SOLN
500.0000 mg | Freq: Two times a day (BID) | INTRAVENOUS | Status: DC
  Administered 2019-07-06: 500 mg via INTRAVENOUS
  Filled 2019-07-05 (×8): qty 100

## 2019-07-05 MED ORDER — LORAZEPAM 2 MG/ML IJ SOLN
0.5000 mg | Freq: Once | INTRAMUSCULAR | Status: DC
  Filled 2019-07-05: qty 1

## 2019-07-05 NOTE — Consult Note (Signed)
[facility] Consult Services   Date of Service:   07/05/2019 15:26:49  Impression:     .  New Onset seizure     .  Todd's Paralysis  Comments/Sign-Out: Patient presented with breakthough seizure followed by left sided weakness. HCT showed no acute findings. no tPA was offered since patient was back to normal.  Metrics: Last Known Well: 07/05/2019 14:30:00 [facility] Notification Time: 07/05/2019 15:26:49 Arrival Time: 07/05/2019 15:11:00 Stamp Time: 07/05/2019 15:26:49 Time First Login Attempt: 07/05/2019 15:31:45 Video Start Time: 07/05/2019 15:31:45  Symptoms: Left sided weakness NIHSS Start Assessment Time: 07/05/2019 15:32:30 Patient is not a candidate for tPA. Patient was not deemed candidate for tPA thrombolytics because of Resolved symptoms (no residual disabling symptoms). Video End Time: 07/05/2019 15:43:03  CT head showed no acute hemorrhage or acute core infarct.  Clinical Presentation is not Suggestive of Large Vessel Occlusive Disease  ED Physician notified of diagnostic impression and management plan on 07/05/2019 15:43:05  Our recommendations are outlined below.  Recommendations:     .  Activate Stroke Protocol Admission/Order Set     .  Stroke/Telemetry Floor     .  Neuro Checks     .  Bedside Swallow Eval     .  DVT Prophylaxis     .  IV Fluids, Normal Saline     .  Head of Bed 30 Degrees     .  Euglycemia and Avoid Hyperthermia (PRN Acetaminophen)  Routine Consultation with [facility] for Follow up Care  Sign Out:     .  Discussed with Emergency Department Provider    ------------------------------------------------------------------------------  History of Present Illness: Patient is a 68 year old Male.  Patient was brought by EMS for symptoms of Left sided weakness  PMH of prostate cancer, Memory disorder presented with new onset seizure. . It was witnessed by significant other around 1430. After the  breakthrough seizure he was found to be weak on the left side. He was brought in to ER with improving...  Last seen normal was within 4.5 hours. There is no history of hemorrhagic complications or intracranial hemorrhage. There is no history of Recent Anticoagulants. There is no history of recent major surgery. There is no history of recent stroke.  Examination: 1A: Level of Consciousness - Alert; keenly responsive + 0 1B: Ask Month and Age - Both Questions Right + 0 1C: Blink Eyes & Squeeze Hands - Performs Both Tasks + 0 2: Test Horizontal Extraocular Movements - Normal + 0 3: Test Visual Fields - No Visual Loss + 0 4: Test Facial Palsy (Use Grimace if Obtunded) - Normal symmetry + 0 5A: Test Left Arm Motor Drift - No Drift for 10 Seconds + 0 5B: Test Right Arm Motor Drift - No Drift for 10 Seconds + 0 6A: Test Left Leg Motor Drift - No Drift for 5 Seconds + 0 6B: Test Right Leg Motor Drift - No Drift for 5 Seconds + 0 7: Test Limb Ataxia (FNF/Heel-Shin) - No Ataxia + 0 8: Test Sensation - Normal; No sensory loss + 0 9: Test Language/Aphasia - Normal; No aphasia + 0 10: Test Dysarthria - Normal + 0 11: Test Extinction/Inattention - No abnormality + 0  NIHSS Score: 0  Patient/Family was informed the Neurology Consult would happen via TeleHealth consult by way of interactive audio and video telecommunications and consented to receiving care in this manner.    Dr Hinda Lenis Lashona Schaaf   [facility] [phone]  Case [id number]

## 2019-07-05 NOTE — ED Triage Notes (Signed)
LK

## 2019-07-05 NOTE — ED Provider Notes (Signed)
[facility] EMERGENCY DEPARTMENT Provider Note   CSN: [id number] Arrival date & time: 07/05/19  1511  An emergency department physician performed an initial assessment on this suspected stroke patient at 1513.  History   Chief Complaint Chief Complaint  Patient presents with  . Code Stroke    HPI ROLLIN KOTOWSKI is a 68 y.o. male.     History provided by patient wife and EMS.  Patient's wife stated that she fixed her husband something to eat and she went back to warm up her food and she heard a noise and then saw her husband sort of slumped over he was confused.  She called EMS.  EMS stated that the patient was unable to use his left arm and left leg but was alert but confused  The history is provided by the patient, a relative and the EMS personnel. No language interpreter was used.  Altered Mental Status Presenting symptoms: behavior changes   Severity:  Moderate Most recent episode:  Today Episode history:  Single Timing:  Constant Progression:  Waxing and waning Chronicity:  New Context: not alcohol use   Associated symptoms: seizures   Associated symptoms: no abdominal pain, no hallucinations, no headaches and no rash     Past Medical History:  Diagnosis Date  . Cancer [facility])    prostate  . Cervical spondylosis   . Glaucoma   . Hematuria    radiation cystitis  . History of migraine headaches   . Hx of arthroscopic knee surgery    Left knee  . Hyperlipidemia   . Hypertension   . Impaired fasting glucose   . Memory loss   . Progressive gait disorder   . Renal disorder    kidney stone  . Varicose veins     Patient Active Problem List   Diagnosis Date Noted  . Seizures ([place]) 07/05/2019  . Memory disorder 12/13/2017  . AKI (acute kidney injury) ([place]) 02/16/2017  . Hyponatremia 02/16/2017  . Hypokalemia 02/15/2017  . Syncope and collapse 12/07/2012  . Hyperthyroidism 12/07/2012  . Other malaise and fatigue 12/07/2012  . Disturbance of skin sensation  12/07/2012  . Cervical spondylosis without myelopathy 12/07/2012  . Abnormality of gait 12/07/2012  . 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 CHOLECYSTECTOMY    . EYE SURGERY    . PROSTATE SURGERY          Home Medications    Prior to Admission medications   Medication Sig Start Date End Date Taking? Authorizing Provider  donepezil (ARICEPT) 10 MG tablet Take one tablet at bedtime.  Please call [phone] to schedule an appt. 06/19/19   Suzzanne Cloud, NP  glimepiride (AMARYL) 2 MG tablet Take 2 mg by mouth every morning. 04/16/19   [provider]  Insulin Detemir (LEVEMIR FLEXTOUCH) 100 UNIT/ML Pen Inject 20-30 Units into the skin every morning.     [provider]  KLOR-CON M20 20 MEQ tablet TAKE 1 TABLET THREE TIMES DAILY FOR 3 DAYS, THEN 1 TABLET DAILY 02/18/19   [provider]  memantine (NAMENDA) 10 MG tablet Take 1 tablet (10 mg total) by mouth 2 (two) times daily. Please call [phone] to schedule an appt. 06/19/19   Suzzanne Cloud, NP  simvastatin (ZOCOR) 20 MG tablet Take 20 mg by mouth at bedtime.    [provider]    Family History Family History  Problem Relation Age of Onset  .  Glaucoma Mother   . Arthritis Father   . Diabetes Father   . Fibromyalgia Sister   . Gait disorder Maternal Uncle        progessive gait disorder    Social History Social History   Tobacco Use  . Smoking status: Former Research scientist (life sciences)  . Smokeless tobacco: Never Used  Substance Use Topics  . Alcohol use: Yes    Comment: 0.5 pint of wine daily  . Drug use: No     Allergies   Penicillins, Atenolol, Clonidine derivatives, Metformin and related, and Tape   Review of Systems Review of Systems  Constitutional: Negative for appetite change and fatigue.  HENT: Negative for congestion, ear discharge and sinus pressure.   Eyes: Negative for discharge.  Respiratory: Negative  for cough.   Cardiovascular: Negative for chest pain.  Gastrointestinal: Negative for abdominal pain and diarrhea.  Genitourinary: Negative for frequency and hematuria.  Musculoskeletal: Negative for back pain.  Skin: Negative for rash.  Neurological: Positive for seizures. Negative for headaches.  Psychiatric/Behavioral: Negative for hallucinations.     Physical Exam Updated Vital Signs BP 129/84   Pulse 86   Resp 20   SpO2 93%   Physical Exam Vitals signs and nursing note reviewed.  Constitutional:      Appearance: He is well-developed.  HENT:     Head: Normocephalic.     Nose: Nose normal.  Eyes:     General: No scleral icterus.    Conjunctiva/sclera: Conjunctivae normal.  Neck:     Musculoskeletal: Neck supple.     Thyroid: No thyromegaly.  Cardiovascular:     Rate and Rhythm: Normal rate and regular rhythm.     Heart sounds: No murmur. No friction rub. No gallop.   Pulmonary:     Breath sounds: No stridor. No wheezing or rales.  Chest:     Chest wall: No tenderness.  Abdominal:     General: There is no distension.     Tenderness: There is no abdominal tenderness. There is no rebound.  Musculoskeletal: Normal range of motion.     Comments: Patient has some decreased strength in left arm and left leg  Lymphadenopathy:     Cervical: No cervical adenopathy.  Skin:    Findings: No erythema or rash.  Neurological:     Motor: No abnormal muscle tone.     Coordination: Coordination normal.     Comments: Patient alert but confused  Psychiatric:        Behavior: Behavior normal.      ED Treatments / Results  Labs (all labs ordered are listed, but only abnormal results are displayed) Labs Reviewed  CBC - Abnormal; Notable for the following components:      Result Value   RBC 3.90 (*)    Hemoglobin 10.6 (*)    HCT 35.1 (*)    RDW 16.2 (*)    All other components within normal limits  COMPREHENSIVE METABOLIC PANEL - Abnormal; Notable for the following  components:   Sodium 134 (*)    CO2 19 (*)    Glucose, Bld 371 (*)    Creatinine, Ser 1.52 (*)    Albumin 3.4 (*)    GFR calc non Af Amer 46 (*)    GFR calc Af Amer 54 (*)    All other components within normal limits  CBG MONITORING, ED - Abnormal; Notable for the following components:   Glucose-Capillary 358 (*)    All other components within normal limits  SARS CORONAVIRUS  2 (HOSPITAL ORDER, [facility])  ETHANOL  PROTIME-INR  APTT  DIFFERENTIAL  RAPID URINE DRUG SCREEN, HOSP PERFORMED  URINALYSIS, ROUTINE W REFLEX MICROSCOPIC  I-STAT CHEM 8, ED  TROPONIN I (HIGH SENSITIVITY)    EKG EKG Interpretation  Date/Time:  Thursday July 05 2019 15:12:22 EDT Ventricular Rate:  96 PR Interval:    QRS Duration: 86 QT Interval:  363 QTC Calculation: 459 R Axis:   -54 Text Interpretation:  Sinus rhythm Left anterior fascicular block Low voltage, precordial leads Consider anterior infarct Confirmed by Milton Ferguson [id number]) on 07/05/2019 4:39:31 PM   Radiology Mr Brain Wo Contrast  Result Date: 07/05/2019 CLINICAL DATA:  Altered mental status. Left-sided weakness, facial droop, and vision loss. EXAM: MRI HEAD WITHOUT CONTRAST TECHNIQUE: Multiplanar, multiecho pulse sequences of the brain and surrounding structures were obtained without intravenous contrast. COMPARISON:  Head CT 07/05/2019 and MRI 01/02/2018 FINDINGS: The examination had to be discontinued prior to completion due to the patient's inability to tolerate the examination including attempts to climb out of the scanner. Axial and coronal diffusion, sagittal T1, axial T2*, and axial T2 sequences were obtained. The study is motion degraded, including severe motion on the axial T2 sequence. Brain: Diffusion imaging is of diagnostic quality, and there is no evidence of acute infarct. No intracranial hemorrhage, intracranial mass effect, or extra-axial fluid collection is identified. Mildly age advanced  cerebral atrophy is noted. Assessment of chronic cerebral white matter disease is limited due to motion and incomplete imaging. Vascular: Poorly evaluated due to motion. Skull and upper cervical spine: No suspicious marrow lesion. Sinuses/Orbits: Bilateral cataract extraction. No evidence of significant acute inflammatory sinus disease. Other: None. IMPRESSION: Motion degraded, incomplete examination without evidence of acute abnormality. Electronically Signed   By: Logan Bores M.D.   On: 07/05/2019 16:30   Ct Head Code Stroke Wo Contrast  Result Date: 07/05/2019 CLINICAL DATA:  Code stroke. Altered level of consciousness. Seizure like activity. EXAM: CT HEAD WITHOUT CONTRAST TECHNIQUE: Contiguous axial images were obtained from the base of the skull through the vertex without intravenous contrast. COMPARISON:  Head MRI 01/02/2018 and CT 02/15/2017 FINDINGS: The study is mildly motion degraded. Brain: There is no evidence of acute infarct, intracranial hemorrhage, mass, midline shift, or extra-axial fluid collection. There is mildly age advanced cerebral atrophy. Cerebral white matter hypodensities are nonspecific but compatible with mild chronic small vessel ischemic disease, stable to mildly progressed from the prior CT. Vascular: Calcified atherosclerosis at the skull base. No hyperdense vessel. Skull: No fracture or focal osseous lesion is identified although a portion of the left frontal skull was excluded. Sinuses/Orbits: Visualized paranasal sinuses and mastoid air cells are clear. Bilateral cataract extraction is noted. Other: None. ASPECTS [facility] Stroke Program Early CT Score) Not scored with this history. IMPRESSION: 1. No evidence of acute intracranial abnormality. 2. Mild chronic small vessel ischemic disease and cerebral atrophy. These results were called by telephone at the time of interpretation on 07/05/2019 at 3:39 pm to Dr. Milton Ferguson , who verbally acknowledged these results. Electronically  Signed   By: Logan Bores M.D.   On: 07/05/2019 15:39    Procedures Procedures (including critical care time)  Medications Ordered in ED Medications  gadobutrol (GADAVIST) 1 MMOL/ML injection 10 mL (10 mLs Intravenous Contrast Given 07/05/19 1608)  levETIRAcetam (KEPPRA) IVPB 1000 mg/100 mL premix ( Intravenous Stopped 07/05/19 1658)     Initial Impression / Assessment and Plan / ED Course  I have reviewed  the triage vital signs and the nursing notes.  Pertinent labs & imaging results that were available during my care of the patient were reviewed by me and considered in my medical decision making (see chart for details).       CRITICAL CARE Performed by: Milton Ferguson Total critical care time:45 minutes Critical care time was exclusive of separately billable procedures and treating other patients. Critical care was necessary to treat or prevent imminent or life-threatening deterioration. Critical care was time spent personally by me on the following activities: development of treatment plan with patient and/or surrogate as well as nursing, discussions with consultants, evaluation of patient's response to treatment, examination of patient, obtaining history from patient or surrogate, ordering and performing treatments and interventions, ordering and review of laboratory studies, ordering and review of radiographic studies, pulse oximetry and re-evaluation of patient's condition.  Labs reviewed and unremarkable except for elevated glucose.  CT scan shows no acute process.  MRI without shows no acute process.  Patient had a second episode while he was getting MRI where he became confused.  When he got back from MRI he was unable to move his left arm and left leg initially but that resolved over a few minutes.  Suspect the patient has had a couple seizures.  He is placed on Keppra will be admitted by medicine service Final Clinical Impressions(s) / ED Diagnoses   Final diagnoses:  None     ED Discharge Orders    None       Milton Ferguson, MD 07/05/19 1722

## 2019-07-05 NOTE — H&P (Signed)
History and Physical    Jeffery [name] QPY:[id number] DOB: [birth year] DOA: 07/05/2019  PCP: [name], MD   Patient coming from: Home  I have personally briefly reviewed patient's old medical records in [place]  Chief Complaint: Altered mental status, facial droop, left-sided vision loss and left-sided weakness  HPI: Jeffery [name] is a 67 y.o. male with medical history significant for prostate cancer, diabetes, hypertension, who was brought to the ED via EMS with reports of altered mental status and left-sided weakness.  History is obtained from spouse who is at bedside, as patient cannot remember the events of what happened.  At the time of my evaluation patient is somewhat drowsy but responding to most questions appropriately and following most directions. At about 2:30 PM this afternoon, spouse had given patient lunch to eat, when she heard a noise, when she arrived patient had dropped his food on the floor, knocked over the table and the contents in it.  She called patient's name but patient was not responding.  His eyes were closed, there was a slight droop to the right side of his face (triage intake notes reports left side) and what looked like a little bit of foamy sputum present at the side of his mouth.  She did not witness any jerking activity, she is unsure if there was jerking activity before she got there.  She denies a history of seizures in the patient. No fever no chills, no difficulty breathing no cough, no complaints of pain.  No vomiting no loose stools, unchanged oral intake.  Patient intermittently self caths, as he occasionally has blood clots in his urine. He has a history of memory loss, but at baseline patient is independent of all his ADLs, can hold a conversation, for the most part recognizes family, ambulates without assistance.  On EMS arrival, patient did not have vision in his left eye, and he was unable to move his left side, these symptoms resolved on  arrival to the ED.    ED Course: Stable vitals.Sodium 134, mildly low bicarb 19, creatinine 1.5, negative blood alcohol level.  Hemoglobin 10.6.  Stat head CT without acute intracranial abnormality.  Telemetry neurology was consulted-impression new onset seizure, todd paralysis.  Stroke protocol recommended, MRI brain with and without contrast recommended.   MRI was ordered but could not be completed as patient had another event of confusion, disorientation during the scan.  Patient was unable to raise his left arm and had left-sided facial droop. Patient loaded with IV Keppra 1000 mg, hospitalist to admit for seizure, stroke work-up.  Review of Systems: As per HPI all other systems reviewed and negative.  Past Medical History:  Diagnosis Date  . Cancer [facility])    prostate  . Cervical spondylosis   . Glaucoma   . Hematuria    radiation cystitis  . History of migraine headaches   . Hx of arthroscopic knee surgery    Left knee  . Hyperlipidemia   . Hypertension   . Impaired fasting glucose   . Memory loss   . Progressive gait disorder   . Renal disorder    kidney stone  . Varicose veins     Past Surgical History:  Procedure Laterality Date  . CHOLECYSTECTOMY    . EYE SURGERY    . PROSTATE SURGERY       reports that he has quit smoking. He has never used smokeless tobacco. He reports current alcohol use. He reports that he does not  use drugs.  Allergies  Allergen Reactions  . Penicillins Hives, Nausea And Vomiting and Swelling    Swelling of lips Has patient had a PCN reaction causing immediate rash, facial/tongue/throat swelling, SOB or lightheadedness with hypotension: Yes Has patient had a PCN reaction causing severe rash involving mucus membranes or skin necrosis: No Has patient had a PCN reaction that required hospitalization No Has patient had a PCN reaction occurring within the last 10 years: No If all of the above answers are "NO", then may proceed with Cephalosporin  use.   . Atenolol Swelling and Other (See Comments)    Angioedema   . Clonidine Derivatives Swelling and Other (See Comments)    Angioedema   . Metformin And Related     Irritable   . Tape Rash    No plastic, "patterned" tape!!    Family History  Problem Relation Age of Onset  . Glaucoma Mother   . Arthritis Father   . Diabetes Father   . Fibromyalgia Sister   . Gait disorder Maternal Uncle        progessive gait disorder    Prior to Admission medications   Medication Sig Start Date End Date Taking? Authorizing Provider  donepezil (ARICEPT) 10 MG tablet Take one tablet at bedtime.  Please call [phone] to schedule an appt. 06/19/19   Suzzanne Cloud, NP  glimepiride (AMARYL) 2 MG tablet Take 2 mg by mouth every morning. 04/16/19   [provider]  Insulin Detemir (LEVEMIR FLEXTOUCH) 100 UNIT/ML Pen Inject 20-30 Units into the skin every morning.     [provider]  KLOR-CON M20 20 MEQ tablet TAKE 1 TABLET THREE TIMES DAILY FOR 3 DAYS, THEN 1 TABLET DAILY 02/18/19   [provider]  memantine (NAMENDA) 10 MG tablet Take 1 tablet (10 mg total) by mouth 2 (two) times daily. Please call [phone] to schedule an appt. 06/19/19   Suzzanne Cloud, NP  simvastatin (ZOCOR) 20 MG tablet Take 20 mg by mouth at bedtime.    [provider]    Physical Exam: Vitals:   07/05/19 1531  BP: 129/84  Pulse: 86  Resp: 20  SpO2: 93%    Constitutional: Somewhat drowsy, otherwise calm, comfortable Vitals:   07/05/19 1531  BP: 129/84  Pulse: 86  Resp: 20  SpO2: 93%   Eyes: PERRL, lids and conjunctivae normal ENMT: Mucous membranes are moist. Posterior pharynx clear of any exudate or lesions. Neck: normal, supple, no masses, no thyromegaly Respiratory: clear to auscultation bilaterally, no wheezing, no crackles. Normal respiratory effort. No accessory muscle use.  Cardiovascular: Regular rate and rhythm, no murmurs / rubs / gallops. No extremity edema. 2+  pedal pulses.  Abdomen: no tenderness, no masses palpated. No hepatosplenomegaly. Bowel sounds positive.  Musculoskeletal: no clubbing / cyanosis. No joint deformity upper and lower extremities. Good ROM, no contractures. Normal muscle tone.  Skin: no rashes, lesions, ulcers. No induration Neurologic: the left nasolabial fold appears slightly less prominent that the right at rest, but disappears when smiling, otherwise no appreciable facial droop, and other cranial nerves are intact, within start sensation globally, 4+/5 strength in all extremities.   Psychiatric: Normal judgment and insight. Alert and oriented x 3. Normal mood.   Labs on Admission: I have personally reviewed following labs and imaging studies  CBC: Recent Labs  Lab 07/05/19 1538  WBC 5.2  NEUTROABS 3.0  HGB 10.6*  HCT 35.1*  MCV 90.0  PLT 062   Basic Metabolic Panel:  Recent Labs  Lab 07/05/19 1538  NA 134*  K 4.1  CL 104  CO2 19*  GLUCOSE 371*  BUN 23  CREATININE 1.52*  CALCIUM 9.0   Liver Function Tests: Recent Labs  Lab 07/05/19 1538  AST 19  ALT 13  ALKPHOS 97  BILITOT 1.0  PROT 7.8  ALBUMIN 3.4*   Coagulation Profile: Recent Labs  Lab 07/05/19 1538  INR 1.1   CBG: Recent Labs  Lab 07/05/19 1532  GLUCAP 358*    Radiological Exams on Admission: Mr Brain Wo Contrast  Result Date: 07/05/2019 CLINICAL DATA:  Altered mental status. Left-sided weakness, facial droop, and vision loss. EXAM: MRI HEAD WITHOUT CONTRAST TECHNIQUE: Multiplanar, multiecho pulse sequences of the brain and surrounding structures were obtained without intravenous contrast. COMPARISON:  Head CT 07/05/2019 and MRI 01/02/2018 FINDINGS: The examination had to be discontinued prior to completion due to the patient's inability to tolerate the examination including attempts to climb out of the scanner. Axial and coronal diffusion, sagittal T1, axial T2*, and axial T2 sequences were obtained. The study is motion degraded,  including severe motion on the axial T2 sequence. Brain: Diffusion imaging is of diagnostic quality, and there is no evidence of acute infarct. No intracranial hemorrhage, intracranial mass effect, or extra-axial fluid collection is identified. Mildly age advanced cerebral atrophy is noted. Assessment of chronic cerebral white matter disease is limited due to motion and incomplete imaging. Vascular: Poorly evaluated due to motion. Skull and upper cervical spine: No suspicious marrow lesion. Sinuses/Orbits: Bilateral cataract extraction. No evidence of significant acute inflammatory sinus disease. Other: None. IMPRESSION: Motion degraded, incomplete examination without evidence of acute abnormality. Electronically Signed   By: Logan Bores M.D.   On: 07/05/2019 16:30   Ct Head Code Stroke Wo Contrast  Result Date: 07/05/2019 CLINICAL DATA:  Code stroke. Altered level of consciousness. Seizure like activity. EXAM: CT HEAD WITHOUT CONTRAST TECHNIQUE: Contiguous axial images were obtained from the base of the skull through the vertex without intravenous contrast. COMPARISON:  Head MRI 01/02/2018 and CT 02/15/2017 FINDINGS: The study is mildly motion degraded. Brain: There is no evidence of acute infarct, intracranial hemorrhage, mass, midline shift, or extra-axial fluid collection. There is mildly age advanced cerebral atrophy. Cerebral white matter hypodensities are nonspecific but compatible with mild chronic small vessel ischemic disease, stable to mildly progressed from the prior CT. Vascular: Calcified atherosclerosis at the skull base. No hyperdense vessel. Skull: No fracture or focal osseous lesion is identified although a portion of the left frontal skull was excluded. Sinuses/Orbits: Visualized paranasal sinuses and mastoid air cells are clear. Bilateral cataract extraction is noted. Other: None. ASPECTS [facility] Stroke Program Early CT Score) Not scored with this history. IMPRESSION: 1. No evidence of acute  intracranial abnormality. 2. Mild chronic small vessel ischemic disease and cerebral atrophy. These results were called by telephone at the time of interpretation on 07/05/2019 at 3:39 pm to Dr. Milton Ferguson , who verbally acknowledged these results. Electronically Signed   By: Logan Bores M.D.   On: 07/05/2019 15:39    EKG: Independently reviewed.  Sinus rhythm.  QTc 459.  Left anterior fascicular block.  Assessment/Plan Active Problems:   Seizures ([place])   Possible seizure activity- with intermittent left-sided weakness, facial droop-left, transient left vision loss, with alteration in mental status..  All back to baseline at this time.  Suggestive of Todd's paralysis.  No seizure history.  History of prostate cancer. TeleNeurology consulted, stroke work-up and MRI brain with and  without contrast recommended. Unable to complete MRI brain in Ed, as patient had another event of disorientation, confusion and left-sided weakness. -MRI brain with and without contrast to be done in a.m. -Ativan PRN -Echocardiogram, carotid Dopplers -Lipid panel, hemoglobin A1c -PT evaluation -Neurology consult -EEG -IV Keppra 1000 mg given continue 500 mg every 12h -Held off on antiplatelet at this time with hematuria history, and low likelihood for actual CVA. -Lipitor 40mg  daily  Metabolic encephalopathy-intermittent, mostly resolved at the time of my evaluation, possibly postictal state from seizures.   Probable mild AKI Vs CKD3-creatinine 1.5, recent baseline per care everywhere 0.9-1.3. -Hydrate N/s 100/hr x 15hrs -BMP a.m.  Hypertension-  Stable. Not on antiHTNsive  Diabetes mellitus-random glucose 371.   -Resume Home medications glimepiride and Levemir 13 units nightly - SSI - HgbA1c as part of stroke work-up  Prostate cancer-status radiation therapy 7948, treatment complicated by radiation cystitis.  Completed hyperbaric O2 therapy 2019.  Sometimes needs to self cath, for occasional hematuria  and blood clots in his urine.  Not currently on treatment.  Follows with the urologist details in care everywhere.  Memory loss-stable.  Independent of all ADLs. -Continue home donepezil  Chronic anemia-hemoglobin 10, baseline ~ 9 - 11.   DVT prophylaxis: Lovenox Code Status: fULL Family Communication: Spouse at bedside Disposition Plan: 1 to 2 days Consults called: Neurology Admission status: Observation, telemetry.   Bethena Roys MD [facility]  07/05/2019, 5:58 PM

## 2019-07-05 NOTE — Progress Notes (Signed)
Admission history to be completed when patients wife is available to assist due to current periods of confusion with patient.

## 2019-07-05 NOTE — ED Notes (Signed)
MRI staff informed this nurse that pt had "episode of disorientation and confusion while in MRI". This nurse went to check on pt- upon assessment pt had left sided facial droop, unable to raise left arm- primary nurse, Benn Moulder, RN and Dr Roderic Palau made aware

## 2019-07-05 NOTE — Progress Notes (Signed)
CODE STROKE 1459 CALL TIME 1516 EXAM STARTED - PT DIFFICULT TO KEEP ON TABLE. 1520 EXAM FINISHED, IMAGES TO soc 1526 exam completed in Epic, [facility], spoke with Stacy.

## 2019-07-05 NOTE — ED Triage Notes (Signed)
LKW 1430. Eating lunch with wife, wife states pt started to have seizure like activity, AMS, left sided weakness, left sided vision loss, and left sided facial droop.

## 2019-07-06 ENCOUNTER — Observation Stay (HOSPITAL_COMMUNITY): Payer: Medicare Other

## 2019-07-06 ENCOUNTER — Other Ambulatory Visit (HOSPITAL_COMMUNITY): Payer: Self-pay | Admitting: *Deleted

## 2019-07-06 ENCOUNTER — Inpatient Hospital Stay (HOSPITAL_COMMUNITY)
Admit: 2019-07-06 | Discharge: 2019-07-06 | Disposition: A | Discharge status: Home / Self Care | Payer: Medicare Other | Attending: Family Medicine | Admitting: Family Medicine

## 2019-07-06 DIAGNOSIS — R29700 NIHSS score 0: Secondary | ICD-10-CM | POA: Diagnosis present

## 2019-07-06 DIAGNOSIS — F1010 Alcohol abuse, uncomplicated: Secondary | ICD-10-CM | POA: Diagnosis present

## 2019-07-06 DIAGNOSIS — I444 Left anterior fascicular block: Secondary | ICD-10-CM | POA: Diagnosis present

## 2019-07-06 DIAGNOSIS — Z923 Personal history of irradiation: Secondary | ICD-10-CM | POA: Diagnosis not present

## 2019-07-06 DIAGNOSIS — F0281 Dementia in other diseases classified elsewhere with behavioral disturbance: Secondary | ICD-10-CM

## 2019-07-06 DIAGNOSIS — Z794 Long term (current) use of insulin: Secondary | ICD-10-CM

## 2019-07-06 DIAGNOSIS — N390 Urinary tract infection, site not specified: Secondary | ICD-10-CM | POA: Diagnosis not present

## 2019-07-06 DIAGNOSIS — Z83511 Family history of glaucoma: Secondary | ICD-10-CM | POA: Diagnosis not present

## 2019-07-06 DIAGNOSIS — Z833 Family history of diabetes mellitus: Secondary | ICD-10-CM | POA: Diagnosis not present

## 2019-07-06 DIAGNOSIS — R569 Unspecified convulsions: Secondary | ICD-10-CM | POA: Diagnosis present

## 2019-07-06 DIAGNOSIS — D638 Anemia in other chronic diseases classified elsewhere: Secondary | ICD-10-CM | POA: Diagnosis present

## 2019-07-06 DIAGNOSIS — Z87891 Personal history of nicotine dependence: Secondary | ICD-10-CM | POA: Diagnosis not present

## 2019-07-06 DIAGNOSIS — I6389 Other cerebral infarction: Secondary | ICD-10-CM

## 2019-07-06 DIAGNOSIS — Z79899 Other long term (current) drug therapy: Secondary | ICD-10-CM | POA: Diagnosis not present

## 2019-07-06 DIAGNOSIS — Z9114 Patient's other noncompliance with medication regimen: Secondary | ICD-10-CM | POA: Diagnosis not present

## 2019-07-06 DIAGNOSIS — R319 Hematuria, unspecified: Secondary | ICD-10-CM | POA: Diagnosis not present

## 2019-07-06 DIAGNOSIS — Z880 Allergy status to penicillin: Secondary | ICD-10-CM | POA: Diagnosis not present

## 2019-07-06 DIAGNOSIS — E119 Type 2 diabetes mellitus without complications: Secondary | ICD-10-CM | POA: Diagnosis not present

## 2019-07-06 DIAGNOSIS — G40909 Epilepsy, unspecified, not intractable, without status epilepticus: Secondary | ICD-10-CM | POA: Diagnosis present

## 2019-07-06 DIAGNOSIS — Z888 Allergy status to other drugs, medicaments and biological substances status: Secondary | ICD-10-CM | POA: Diagnosis not present

## 2019-07-06 DIAGNOSIS — E785 Hyperlipidemia, unspecified: Secondary | ICD-10-CM | POA: Diagnosis present

## 2019-07-06 DIAGNOSIS — E1165 Type 2 diabetes mellitus with hyperglycemia: Secondary | ICD-10-CM | POA: Diagnosis present

## 2019-07-06 DIAGNOSIS — G309 Alzheimer's disease, unspecified: Secondary | ICD-10-CM | POA: Diagnosis present

## 2019-07-06 DIAGNOSIS — Z20828 Contact with and (suspected) exposure to other viral communicable diseases: Secondary | ICD-10-CM | POA: Diagnosis present

## 2019-07-06 DIAGNOSIS — Z7982 Long term (current) use of aspirin: Secondary | ICD-10-CM | POA: Diagnosis not present

## 2019-07-06 DIAGNOSIS — I10 Essential (primary) hypertension: Secondary | ICD-10-CM | POA: Diagnosis present

## 2019-07-06 DIAGNOSIS — H409 Unspecified glaucoma: Secondary | ICD-10-CM | POA: Diagnosis present

## 2019-07-06 DIAGNOSIS — G8384 Todd's paralysis (postepileptic): Secondary | ICD-10-CM | POA: Diagnosis present

## 2019-07-06 DIAGNOSIS — Z8546 Personal history of malignant neoplasm of prostate: Secondary | ICD-10-CM | POA: Diagnosis not present

## 2019-07-06 DIAGNOSIS — Y900 Blood alcohol level of less than 20 mg/100 ml: Secondary | ICD-10-CM | POA: Diagnosis present

## 2019-07-06 LAB — CK: Total CK: 118 U/L (ref 49–397)

## 2019-07-06 LAB — BASIC METABOLIC PANEL
Anion gap: 8 (ref 5–15)
BUN: 23 mg/dL (ref 8–23)
CO2: 23 mmol/L (ref 22–32)
Calcium: 9.1 mg/dL (ref 8.9–10.3)
Chloride: 106 mmol/L (ref 98–111)
Creatinine, Ser: 1.41 mg/dL — ABNORMAL HIGH (ref 0.61–1.24)
GFR calc Af Amer: 59 mL/min — ABNORMAL LOW (ref >60)
GFR calc non Af Amer: 51 mL/min — ABNORMAL LOW (ref >60)
Glucose, Bld: 312 mg/dL — ABNORMAL HIGH (ref 70–99)
Potassium: 4 mmol/L (ref 3.5–5.1)
Sodium: 137 mmol/L (ref 135–145)

## 2019-07-06 LAB — LIPID PANEL
Cholesterol: 168 mg/dL (ref 0–200)
HDL: 56 mg/dL (ref >40)
LDL Cholesterol: 99 mg/dL (ref 0–99)
Total CHOL/HDL Ratio: 3 RATIO
Triglycerides: 63 mg/dL (ref <150)
VLDL: 13 mg/dL (ref 0–40)

## 2019-07-06 LAB — ECHOCARDIOGRAM COMPLETE
Height: 74 in
Weight: 3224.01 oz

## 2019-07-06 LAB — GLUCOSE, CAPILLARY
Glucose-Capillary: 378 mg/dL — ABNORMAL HIGH (ref 70–99)
Glucose-Capillary: 413 mg/dL — ABNORMAL HIGH (ref 70–99)

## 2019-07-06 LAB — HEMOGLOBIN A1C
Hgb A1c MFr Bld: 11.5 % — ABNORMAL HIGH (ref 4.8–5.6)
Mean Plasma Glucose: 283.35 mg/dL

## 2019-07-06 MED ORDER — LORAZEPAM 1 MG PO TABS: 1.0000 mg | ORAL_TABLET | Freq: Four times a day (QID) | ORAL | Status: DC | PRN

## 2019-07-06 MED ORDER — LEVETIRACETAM IN NACL 1000 MG/100ML IV SOLN
1000.0000 mg | Freq: Two times a day (BID) | INTRAVENOUS | Status: DC
  Administered 2019-07-06 – 2019-07-08 (×5): 1000 mg via INTRAVENOUS
  Filled 2019-07-06 (×5): qty 100

## 2019-07-06 MED ORDER — THIAMINE HCL 100 MG/ML IJ SOLN: 100.0000 mg | Freq: Every day | INTRAMUSCULAR | Status: DC

## 2019-07-06 MED ORDER — ASPIRIN EC 81 MG PO TBEC
81.0000 mg | DELAYED_RELEASE_TABLET | Freq: Every day | ORAL | Status: DC
  Administered 2019-07-06 – 2019-07-08 (×3): 81 mg via ORAL
  Filled 2019-07-06 (×3): qty 1

## 2019-07-06 MED ORDER — INSULIN DETEMIR 100 UNIT/ML ~~LOC~~ SOLN
20.0000 [IU] | Freq: Every day | SUBCUTANEOUS | Status: DC
  Administered 2019-07-06 – 2019-07-07 (×2): 20 [IU] via SUBCUTANEOUS
  Filled 2019-07-06 (×3): qty 0.2

## 2019-07-06 MED ORDER — VITAMIN B-1 100 MG PO TABS
100.0000 mg | ORAL_TABLET | Freq: Every day | ORAL | Status: DC
  Administered 2019-07-06 – 2019-07-08 (×3): 100 mg via ORAL
  Filled 2019-07-06 (×3): qty 1

## 2019-07-06 MED ORDER — LORAZEPAM 2 MG/ML IJ SOLN: 1.0000 mg | Freq: Four times a day (QID) | INTRAMUSCULAR | Status: DC | PRN

## 2019-07-06 MED ORDER — INSULIN ASPART 100 UNIT/ML ~~LOC~~ SOLN
0.0000 [IU] | Freq: Three times a day (TID) | SUBCUTANEOUS | Status: DC
  Administered 2019-07-07: 7 [IU] via SUBCUTANEOUS
  Administered 2019-07-07: 2 [IU] via SUBCUTANEOUS
  Administered 2019-07-07: 9 [IU] via SUBCUTANEOUS
  Administered 2019-07-08: 7 [IU] via SUBCUTANEOUS
  Administered 2019-07-08: 9 [IU] via SUBCUTANEOUS

## 2019-07-06 MED ORDER — FOLIC ACID 1 MG PO TABS
1.0000 mg | ORAL_TABLET | Freq: Every day | ORAL | Status: DC
  Administered 2019-07-06 – 2019-07-08 (×3): 1 mg via ORAL
  Filled 2019-07-06 (×3): qty 1

## 2019-07-06 MED ORDER — INSULIN ASPART 100 UNIT/ML ~~LOC~~ SOLN
0.0000 [IU] | Freq: Every day | SUBCUTANEOUS | Status: DC
  Administered 2019-07-06: 5 [IU] via SUBCUTANEOUS
  Administered 2019-07-07: 2 [IU] via SUBCUTANEOUS

## 2019-07-06 MED ORDER — ADULT MULTIVITAMIN W/MINERALS CH
1.0000 | ORAL_TABLET | Freq: Every day | ORAL | Status: DC
  Administered 2019-07-06 – 2019-07-08 (×3): 1 via ORAL
  Filled 2019-07-06 (×3): qty 1

## 2019-07-06 MED ORDER — GADOBUTROL 1 MMOL/ML IV SOLN
10.0000 mL | Freq: Once | INTRAVENOUS | Status: AC | PRN
  Administered 2019-07-06: 10 mL via INTRAVENOUS

## 2019-07-06 NOTE — Consult Note (Addendum)
Jeffery A. Merlene Laughter, MD     [email or web address]          Jeffery [name] is an 68 y.o. male.   ASSESSMENT/PLAN: 1.  New onset seizures with the semiology suggesting focal onset with the rapid secondary generalization.  The etiology is unclear.  Given the focal onset and the repetitive nature over the last 24 hours, alcohol withdrawal while a possibility seems much less likely.  No metabolic or medication etiologies are evident at this time.  The MRI does not show lesions which would lend themselves to epilepsy.  No evidence of remote infarct, mass lesions/tumors or temporal lobe lesions or limbic encephalitis.  However, given the repetitive spells, the patient will be treated with seizure medications.  The current dose of Keppra to thousand milligrams twice a day which I think is fine.  Seizure precaution is warranted.  2.  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  There is no dementia labs will be obtained.  Continue with Aricept.  Also continue with memantine.    The patient is a 67 year old ambidextrous black male who presents with new onset seizures.  The patient was found by his wife on the floor unresponsive.  Patient has had 2 other events 1 in the care of EMS  while he was being evaluated and another today that was witnessed by his wife and the nurse.  The spell lasted for about 2 to 3 minutes.  It appears that all his spells weakness seems to be associated with jerking that started this on the right side especially right upper extremity.  The patient however becomes unresponsive and is somewhat amnestic at some point of the spell.  No oral trauma is reported.  No bladder or bowel incontinence.  He does have a history of heavy alcohol use but apparently has not had anything to drink about 2 to 3 days ago.  There is no history of CNS infection, stroke, prematurity or head injuries.  The wife reports that he has had a history of dementia diagnosed in 2018.   She noticed however that he has had some short-term memory impairment a few years before then.  The review of systems otherwise negative.    GENERAL: This a pleasant thin male who is in no acute distress.  HEENT: The tip of the tongue is slightly bruised.  Otherwise no trauma appreciated.  Neck is supple.  ABDOMEN: soft  EXTREMITIES: No edema; there is significant varicosities involving the right calf.  BACK:  Normal  SKIN: Normal by inspection.    MENTAL STATUS: He is awake and alert.  He knows that he is in the hospital and the year is 2020.  He does state his age is 36 after some time.  He does seem to have some confabulation at times.  Sometimes it is difficult for the patient to comprehend and follow commands especially involving the left upper extremity.  There appears to be some evidence of apraxia.  CRANIAL NERVES: Pupils are equal, round and reactive to light and accomodation; extra ocular movements are full, there is no significant nystagmus; visual fields are full; upper and lower facial muscles are normal in strength and symmetric, there is mild flattening of the nasolabial fold on the left side and the wife reports this is new since he has had a seizures; tongue is midline; uvula is midline; shoulder elevation is normal.  MOTOR: There is mild drift of the left upper extremity with classic pronation and mild drift.  Strength seems to be normal on direct testing however.  All the other extremity shows normal tone, bulk and strength.  COORDINATION: Left finger to nose is normal, right finger to nose is normal, No rest tremor; no intention tremor; no postural tremor; no bradykinesia.  REFLEXES: Deep tendon reflexes are symmetrical and normal. Plantar reflexes are flexor bilaterally.   SENSATION: Normal to light touch, temperature, and pain.       Blood pressure 127/70, pulse 60, temperature 97.8 F (36.6 C), temperature source Oral, resp. rate 18, height 6\' 2"  (1.88 m),  weight 91.4 kg, SpO2 98 %.  Past Medical History:  Diagnosis Date  . Cancer [facility])    prostate  . Cervical spondylosis   . Glaucoma   . Hematuria    radiation cystitis  . History of migraine headaches   . Hx of arthroscopic knee surgery    Left knee  . Hyperlipidemia   . Hypertension   . Impaired fasting glucose   . Memory loss   . Progressive gait disorder   . Renal disorder    kidney stone  . Varicose veins     Past Surgical History:  Procedure Laterality Date  . CHOLECYSTECTOMY    . EYE SURGERY    . PROSTATE SURGERY      Family History  Problem Relation Age of Onset  . Glaucoma Mother   . Arthritis Father   . Diabetes Father   . Fibromyalgia Sister   . Gait disorder Maternal Uncle        progessive gait disorder    Social History:  reports that he has quit smoking. He has never used smokeless tobacco. He reports current alcohol use. He reports that he does not use drugs.  Allergies:  Allergies  Allergen Reactions  . Penicillins Hives, Nausea And Vomiting and Swelling    Swelling of lips Has patient had a PCN reaction causing immediate rash, facial/tongue/throat swelling, SOB or lightheadedness with hypotension: Yes Has patient had a PCN reaction causing severe rash involving mucus membranes or skin necrosis: No Has patient had a PCN reaction that required hospitalization No Has patient had a PCN reaction occurring within the last 10 years: No If all of the above answers are "NO", then may proceed with Cephalosporin use.   . Amlodipine Swelling    Caused swelling in feet and legs  . Atenolol Swelling and Other (See Comments)    Angioedema   . Clonidine Derivatives Swelling and Other (See Comments)    Angioedema   . Metformin And Related     Irritable   . Tape Rash    No plastic, "patterned" tape!!    Medications: Prior to Admission medications   Medication Sig Start Date End Date Taking? Authorizing Provider  donepezil (ARICEPT) 10 MG tablet  Take one tablet at bedtime.  Please call [phone] to schedule an appt. 06/19/19  Yes Suzzanne Cloud, NP  glimepiride (AMARYL) 2 MG tablet Take 2 mg by mouth every morning. 04/16/19  Yes [provider]  Insulin Detemir (LEVEMIR FLEXTOUCH) 100 UNIT/ML Pen Inject 20-30 Units into the skin every morning.    Yes [provider]  KLOR-CON M20 20 MEQ tablet Take 20 mEq by mouth daily.  02/18/19  Yes [provider]  memantine (NAMENDA) 10 MG tablet Take 1 tablet (10 mg total) by mouth 2 (two) times daily. Please call [phone] to schedule an appt. 06/19/19  Yes Suzzanne Cloud, NP  simvastatin (ZOCOR) 20 MG tablet Take 20  mg by mouth at bedtime.   Yes [provider]    Scheduled Meds: . aspirin EC  81 mg Oral Daily  . atorvastatin  40 mg Oral q1800  . donepezil  10 mg Oral QHS  . enoxaparin (LOVENOX) injection  40 mg Subcutaneous Q24H  . folic acid  1 mg Oral Daily  . insulin aspart  0-5 Units Subcutaneous QHS  . [START ON 07/07/2019] insulin aspart  0-9 Units Subcutaneous TID WC  . insulin detemir  20 Units Subcutaneous QHS  . LORazepam  0.5 mg Intravenous Once  . multivitamin with minerals  1 tablet Oral Daily  . thiamine  100 mg Oral Daily   Or  . thiamine  100 mg Intravenous Daily   Continuous Infusions: . levETIRAcetam 1,000 mg (07/06/19 1623)   PRN Meds:.acetaminophen **OR** acetaminophen, LORazepam, LORazepam **OR** LORazepam, ondansetron **OR** ondansetron (ZOFRAN) IV, polyethylene glycol     Results for orders placed or performed during the hospital encounter of 07/05/19 (from the past 48 hour(s))  Urine rapid drug screen (hosp performed)     Status: None   Collection Time: 07/05/19  3:13 PM  Result Value Ref Range   Opiates NONE DETECTED NONE DETECTED   Cocaine NONE DETECTED NONE DETECTED   Benzodiazepines NONE DETECTED NONE DETECTED   Amphetamines NONE DETECTED NONE DETECTED   Tetrahydrocannabinol NONE DETECTED NONE DETECTED   Barbiturates  NONE DETECTED NONE DETECTED    Comment: (NOTE) DRUG SCREEN FOR MEDICAL PURPOSES ONLY.  IF CONFIRMATION IS NEEDED FOR ANY PURPOSE, NOTIFY LAB WITHIN 5 DAYS. LOWEST DETECTABLE LIMITS FOR URINE DRUG SCREEN Drug Class                     Cutoff (ng/mL) Amphetamine and metabolites    1000 Barbiturate and metabolites    200 Benzodiazepine                 956 Tricyclics and metabolites     300 Opiates and metabolites        300 Cocaine and metabolites        300 THC                            50 Performed at [facility], [address], [place], [place] [address]   Urinalysis, Routine w reflex microscopic     Status: Abnormal   Collection Time: 07/05/19  3:13 PM  Result Value Ref Range   Color, Urine STRAW (A) YELLOW   APPearance CLEAR CLEAR   Specific Gravity, Urine 1.014 1.005 - 1.030   pH 5.0 5.0 - 8.0   Glucose, UA >=500 (A) NEGATIVE mg/dL   Hgb urine dipstick SMALL (A) NEGATIVE   Bilirubin Urine NEGATIVE NEGATIVE   Ketones, ur NEGATIVE NEGATIVE mg/dL   Protein, ur NEGATIVE NEGATIVE mg/dL   Nitrite NEGATIVE NEGATIVE   Leukocytes,Ua NEGATIVE NEGATIVE   RBC / HPF 0-5 0 - 5 RBC/hpf   WBC, UA 0-5 0 - 5 WBC/hpf   Bacteria, UA NONE SEEN NONE SEEN   Squamous Epithelial / LPF 0-5 0 - 5   Mucus PRESENT     Comment: Performed at [facility], [address] [place], [place] [address]  CBG monitoring, ED     Status: Abnormal   Collection Time: 07/05/19  3:32 PM  Result Value Ref Range   Glucose-Capillary 358 (H) 70 - 99 mg/dL  Protime-INR     Status: None  Collection Time: 07/05/19  3:38 PM  Result Value Ref Range   Prothrombin Time 14.2 11.4 - 15.2 seconds   INR 1.1 0.8 - 1.2    Comment: (NOTE) INR goal varies based on device and disease states. Performed at [facility], [address], [place], [place] [address]   APTT     Status: None   Collection Time: 07/05/19  3:38 PM  Result Value Ref Range   aPTT 30 24 - 36 seconds    Comment: Performed at [facility], [address] [place], [place] [address]  CBC     Status: Abnormal   Collection Time: 07/05/19  3:38 PM  Result Value Ref Range   WBC 5.2 4.0 - 10.5 K/uL   RBC 3.90 (L) 4.22 - 5.81 MIL/uL   Hemoglobin 10.6 (L) 13.0 - 17.0 g/dL   HCT 35.1 (L) 39.0 - 52.0 %   MCV 90.0 80.0 - 100.0 fL   MCH 27.2 26.0 - 34.0 pg   MCHC 30.2 30.0 - 36.0 g/dL   RDW 16.2 (H) 11.5 - 15.5 %   Platelets 223 150 - 400 K/uL   nRBC 0.0 0.0 - 0.2 %    Comment: Performed at [facility], [address] [place], [place] [address]  Differential     Status: None   Collection Time: 07/05/19  3:38 PM  Result Value Ref Range   Neutrophils Relative % 57 %   Neutro Abs 3.0 1.7 - 7.7 K/uL   Lymphocytes Relative 32 %   Lymphs Abs 1.7 0.7 - 4.0 K/uL   Monocytes Relative 9 %   Monocytes Absolute 0.5 0.1 - 1.0 K/uL   Eosinophils Relative 0 %   Eosinophils Absolute 0.0 0.0 - 0.5 K/uL   Basophils Relative 1 %   Basophils Absolute 0.0 0.0 - 0.1 K/uL   Immature Granulocytes 1 %   Abs Immature Granulocytes 0.03 0.00 - 0.07 K/uL    Comment: Performed at [facility], [address] [place], [place] [address]  Comprehensive metabolic panel     Status: Abnormal   Collection Time: 07/05/19  3:38 PM  Result Value Ref Range   Sodium 134 (L) 135 - 145 mmol/L   Potassium 4.1 3.5 - 5.1 mmol/L   Chloride 104 98 - 111 mmol/L   CO2 19 (L) 22 - 32 mmol/L   Glucose, Bld 371 (H) 70 - 99 mg/dL   BUN 23 8 - 23 mg/dL   Creatinine, Ser 1.52 (H) 0.61 - 1.24 mg/dL   Calcium 9.0 8.9 - 10.3 mg/dL   Total Protein 7.8 6.5 - 8.1 g/dL   Albumin 3.4 (L) 3.5 - 5.0 g/dL   AST 19 15 - 41 U/L   ALT 13 0 - 44 U/L   Alkaline Phosphatase 97 38 - 126 U/L   Total Bilirubin 1.0 0.3 - 1.2 mg/dL   GFR calc non Af Amer 46 (L) >60 mL/min   GFR calc Af Amer 54 (L) >60 mL/min   Anion gap 11 5 - 15    Comment: Performed at [facility], [address] [place], [place] [address]  Troponin I (High Sensitivity)     Status: None   Collection Time: 07/05/19  3:38 PM   Result Value Ref Range   Troponin I (High Sensitivity) 4 <18 ng/L    Comment: (NOTE) Elevated high sensitivity troponin I (hsTnI) values and significant  changes across serial measurements may suggest ACS but many other  chronic and acute conditions are  known to elevate hsTnI results.  Refer to the "Links" section for chest pain algorithms and additional  guidance. Performed at [facility], [address] [place], [place] [address]   Magnesium     Status: None   Collection Time: 07/05/19  3:38 PM  Result Value Ref Range   Magnesium 2.0 1.7 - 2.4 mg/dL    Comment: Performed at [facility], [address], [place], [place] [address]  Phosphorus     Status: None   Collection Time: 07/05/19  3:38 PM  Result Value Ref Range   Phosphorus 4.0 2.5 - 4.6 mg/dL    Comment: Performed at [facility], [address] [place], [place] [address]  Hemoglobin A1c     Status: Abnormal   Collection Time: 07/05/19  3:38 PM  Result Value Ref Range   Hgb A1c MFr Bld 11.5 (H) 4.8 - 5.6 %    Comment: (NOTE) Pre diabetes:          5.7%-6.4% Diabetes:              >6.4% Glycemic control for   <7.0% adults with diabetes    Mean Plasma Glucose 283.35 mg/dL    Comment: Performed at [facility] [address] [place], [place] [address]  Ethanol     Status: None   Collection Time: 07/05/19  3:39 PM  Result Value Ref Range   Alcohol, Ethyl (B) <10 <10 mg/dL    Comment: (NOTE) Lowest detectable limit for serum alcohol is 10 mg/dL. For medical purposes only. Performed at [facility], [address] [place], [place] [address]   SARS Coronavirus 2 [facility] order, Performed in [facility] hospital lab) Nasopharyngeal Nasopharyngeal Swab     Status: None   Collection Time: 07/05/19  4:18 PM   Specimen: Nasopharyngeal Swab  Result Value Ref Range   SARS Coronavirus 2 NEGATIVE NEGATIVE    Comment: (NOTE) If result is NEGATIVE SARS-CoV-2 target nucleic acids are NOT DETECTED. The SARS-CoV-2 RNA  is generally detectable in upper and lower  respiratory specimens during the acute phase of infection. The lowest  concentration of SARS-CoV-2 viral copies this assay can detect is 250  copies / mL. A negative result does not preclude SARS-CoV-2 infection  and should not be used as the sole basis for treatment or other  patient management decisions.  A negative result may occur with  improper specimen collection / handling, submission of specimen other  than nasopharyngeal swab, presence of viral mutation(s) within the  areas targeted by this assay, and inadequate number of viral copies  (<250 copies / mL). A negative result must be combined with clinical  observations, patient history, and epidemiological information. If result is POSITIVE SARS-CoV-2 target nucleic acids are DETECTED. The SARS-CoV-2 RNA is generally detectable in upper and lower  respiratory specimens dur ing the acute phase of infection.  Positive  results are indicative of active infection with SARS-CoV-2.  Clinical  correlation with patient history and other diagnostic information is  necessary to determine patient infection status.  Positive results do  not rule out bacterial infection or co-infection with other viruses. If result is PRESUMPTIVE POSTIVE SARS-CoV-2 nucleic acids MAY BE PRESENT.   A presumptive positive result was obtained on the submitted specimen  and confirmed on repeat testing.  While 2019 novel coronavirus  (SARS-CoV-2) nucleic acids may be present in the submitted sample  additional confirmatory testing may be necessary for epidemiological  and / or clinical management purposes  to differentiate between  SARS-CoV-2 and other Sarbecovirus currently known to infect humans.  If clinically indicated additional testing with an alternate test  methodology [phone]) is advised. The SARS-CoV-2 RNA is generally  detectable in upper and lower respiratory sp ecimens during the acute  phase of infection.  The expected result is Negative. Fact Sheet for Patients:  StrictlyIdeas.no Fact Sheet for Healthcare Providers: BankingDealers.co.za This test is not yet approved or cleared by the [place] FDA and has been authorized for detection and/or diagnosis of SARS-CoV-2 by FDA under an Emergency Use Authorization (EUA).  This EUA will remain in effect (meaning this test can be used) for the duration of the COVID-19 declaration under Section 564(b)(1) of the Act, 21 U.S.C. section 360bbb-3(b)(1), unless the authorization is terminated or revoked sooner. Performed at [facility], [address] [place], [place] [address]   Lipid panel     Status: None   Collection Time: 07/06/19  4:26 AM  Result Value Ref Range   Cholesterol 168 0 - 200 mg/dL   Triglycerides 63 <150 mg/dL   HDL 56 >40 mg/dL   Total CHOL/HDL Ratio 3.0 RATIO   VLDL 13 0 - 40 mg/dL   LDL Cholesterol 99 0 - 99 mg/dL    Comment:        Total Cholesterol/HDL:CHD Risk Coronary Heart Disease Risk Table                     Men   Women  1/2 Average Risk   3.4   3.3  Average Risk       5.0   4.4  2 X Average Risk   9.6   7.1  3 X Average Risk  23.4   11.0        Use the calculated Patient Ratio above and the CHD Risk Table to determine the patient's CHD Risk.        ATP III CLASSIFICATION (LDL):  <100     mg/dL   Optimal  100-129  mg/dL   Near or Above                    Optimal  130-159  mg/dL   Borderline  160-189  mg/dL   High  >190     mg/dL   Very High Performed at [facility] [place], [place] [address]   Basic metabolic panel     Status: Abnormal   Collection Time: 07/06/19  4:26 AM  Result Value Ref Range   Sodium 137 135 - 145 mmol/L   Potassium 4.0 3.5 - 5.1 mmol/L   Chloride 106 98 - 111 mmol/L   CO2 23 22 - 32 mmol/L   Glucose, Bld 312 (H) 70 - 99 mg/dL   BUN 23 8 - 23 mg/dL   Creatinine, Ser 1.41 (H) 0.61 - 1.24 mg/dL   Calcium 9.1 8.9  - 10.3 mg/dL   GFR calc non Af Amer 51 (L) >60 mL/min   GFR calc Af Amer 59 (L) >60 mL/min   Anion gap 8 5 - 15    Comment: Performed at [facility], [address] [place], [place] [address]  CK     Status: None   Collection Time: 07/06/19  4:26 AM  Result Value Ref Range   Total CK 118 49 - 397 U/L    Comment: Performed at [facility], [address] [place],  [address]  Glucose, capillary  Status: Abnormal   Collection Time: 07/06/19  5:24 PM  Result Value Ref Range   Glucose-Capillary 378 (H) 70 - 99 mg/dL    Studies/Results:   EEG IMPRESSION: This study only during sleep is suggestive of mild to moderate diffuse encephalopathy. No seizures or epileptiform discharges were seen throughout the recording.   BRAIN MRI FINDINGS: The study is mildly motion degraded.  Brain: There is no evidence of acute infarct, intracranial hemorrhage, mass, midline shift, or extra-axial fluid collection. Moderate generalized cerebral atrophy is advanced for age. Periventricular white matter T2 hyperintensities are nonspecific but compatible with mild chronic small vessel ischemic disease. The hippocampi are symmetric in size and signal. No abnormal enhancement is identified.  Vascular: Major intracranial vascular flow voids are preserved.  Skull and upper cervical spine: No suspicious marrow lesion.  Sinuses/Orbits: Bilateral cataract extraction. Small left maxillary sinus mucous retention cyst. Clear mastoid air cells.  Other: None.  IMPRESSION: 1. No acute intracranial abnormality or mass. 2. Mild chronic small vessel ischemic disease and moderate cerebral atrophy.     The brain MRI is reviewed in person.  There is no acute findings noted on DWI.  No hemorrhages appreciated.  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  There is moderate global atrophy.  Atrophy is also seen in the mesial temporal area  bilaterally which is marked and worrisome for Alzheimer's dementia.  No abnormal contrast enhancement is appreciated.   Jeffery [name], M.D.  Diplomate, Tax adviser of Psychiatry and Neurology ( Neurology). 07/06/2019, 5:45 PM

## 2019-07-06 NOTE — Progress Notes (Signed)
Patient had seizure like activity while in room.  Patient was in room and right side shaking Episode lasted less than one minute.  Patient is confused and is verbally aggressive and abusive before episode.  Patient has been confused since beginning of shift. Unable to reoriented patient.  Ativan given per md order.  Patient now resting comfortably.

## 2019-07-06 NOTE — Progress Notes (Signed)
EEG complete - results pending 

## 2019-07-06 NOTE — Progress Notes (Signed)
EVS was cleaning room and reported that patient began to have "full body shakes for about 25 seconds". Once arriving to patients room, patient was alert and oriented and not shaking but patient stated "it was the same as before". MD was paged via amion to make aware. Patient is stable at this time.

## 2019-07-06 NOTE — Progress Notes (Signed)
PROGRESS NOTE    CONROY GORACKE  GYI:[id number] DOB: Aug 30, 1951 DOA: 07/05/2019 PCP: [name], MD     Brief Narrative:  68 y.o. male with medical history significant for prostate cancer, diabetes, hypertension, who was brought to the ED via EMS with reports of altered mental status and left-sided weakness.  History is obtained from spouse who is at bedside, as patient cannot remember the events of what happened.  At the time of my evaluation patient is somewhat drowsy but responding to most questions appropriately and following most directions. At about 2:30 PM this afternoon, spouse had given patient lunch to eat, when she heard a noise, when she arrived patient had dropped his food on the floor, knocked over the table and the contents in it.  She called patient's name but patient was not responding.  His eyes were closed, there was a slight droop to the right side of his face (triage intake notes reports left side) and what looked like a little bit of foamy sputum present at the side of his mouth.  She did not witness any jerking activity, she is unsure if there was jerking activity before she got there.  She denies a history of seizures in the patient. No fever no chills, no difficulty breathing no cough, no complaints of pain.  No vomiting no loose stools, unchanged oral intake.  Patient intermittently self caths, as he occasionally has blood clots in his urine. He has a history of memory loss, but at baseline patient is independent of all his ADLs, can hold a conversation, for the most part recognizes family, ambulates without assistance.  Assessment & Plan: 1-seizure-like activity -Patient with history of alcohol abuse -New onset seizure activity -Per wife has not stopped drinking and in fact the day of admission was actually drinking wine -Follow EEG -CT scan and MRI of the head negative for acute abnormalities -Continue Keppra -Will check CK and prolactin level -Follow neurology  recommendations.  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  3-alcohol abuse -Cessation counseling has been provided -Continue thiamine and folic acid -Continue CIWA protocol  4-hyperlipidemia -Continue statins.  5-history of dementia -Continue Aricept and supportive care  6-type 2 diabetes with hyperglycemia -A1c 11.7 demonstrating uncontrolled diabetes -Continue Levemir and sliding scale insulin -Modified carbohydrate diet has been ordered. -Follow CBGs and adjust insulin therapy as needed. -Hold oral hypoglycemic agents while inpatient.   DVT prophylaxis: Lovenox Code Status: Full code Family Communication: Wife updated over the phone. Disposition Plan: To be determined.  Continue adjusted dose of Keppra, follow neurology recommendations, follow EEG.  Stroke work-up demonstrating no acute ischemic changes in patient's brain.  Consultants:   Neurology service  Procedures:   See below for x-ray reports.  Antimicrobials:  Anti-infectives (From admission, onward)   None      Subjective: Still experiencing intermittent episodes of nonsustained seizure-like activity.  Oriented to person and place only.  Objective: Vitals:   07/06/19 0600 07/06/19 0830 07/06/19 1200 07/06/19 1331  BP: (!) 118/57 119/71 121/75 127/70  Pulse: (!) 50 (!) 50 (!) 56 60  Resp: 16 19 20 18   Temp:  97.6 F (36.4 C) 98 F (36.7 C) 97.8 F (36.6 C)  TempSrc:  Oral Oral Oral  SpO2: 99% 98% 96% 98%  Weight:  91.4 kg    Height:  6'  2" (1.88 m)      Intake/Output Summary (Last 24 hours) at 07/06/2019 1703 Last data filed at 07/06/2019 0900 Gross per 24 hour  Intake 240 ml  Output --  Net 240 ml   Filed Weights   07/06/19 0830  Weight: 91.4 kg    Examination: General exam: Alert, awake,  oriented to person and place; still experiencing intermittent episodes of no sustained seizure-like activity.  Per nursing reports he becomes disoriented and confused after events.  No fever, no nausea, no vomiting, no slurred speech, no pronator drift and no muscle strength deficiency appreciated on exam. Respiratory system: Clear to auscultation. Respiratory effort normal. Cardiovascular system:RRR. No murmurs, rubs, gallops. Gastrointestinal system: Abdomen is nondistended, soft and nontender. No organomegaly or masses felt. Normal bowel sounds heard. Central nervous system: Alert and oriented. No focal neurological deficits. Extremities: No C/C/E Skin: No rashes, lesions or ulcers Psychiatry: Judgement and insight appear impaired in the setting of underlying dementia. Mood & affect appropriate currently.     Data Reviewed: I have personally reviewed following labs and imaging studies  CBC: Recent Labs  Lab 07/05/19 1538  WBC 5.2  NEUTROABS 3.0  HGB 10.6*  HCT 35.1*  MCV 90.0  PLT 166   Basic Metabolic Panel: Recent Labs  Lab 07/05/19 1538 07/06/19 0426  NA 134* 137  K 4.1 4.0  CL 104 106  CO2 19* 23  GLUCOSE 371* 312*  BUN 23 23  CREATININE 1.52* 1.41*  CALCIUM 9.0 9.1  MG 2.0  --   PHOS 4.0  --    GFR: Estimated Creatinine Clearance: 58.3 mL/min (A) (by C-G formula based on SCr of 1.41 mg/dL (H)).   Liver Function Tests: Recent Labs  Lab 07/05/19 1538  AST 19  ALT 13  ALKPHOS 97  BILITOT 1.0  PROT 7.8  ALBUMIN 3.4*   Coagulation Profile: Recent Labs  Lab 07/05/19 1538  INR 1.1   Cardiac Enzymes: Recent Labs  Lab 07/06/19 0426  CKTOTAL 118   HbA1C: Recent Labs    07/05/19 1538  HGBA1C 11.5*   CBG: Recent Labs  Lab 07/05/19 1532  GLUCAP 358*   Lipid Profile: Recent Labs    07/06/19 0426  CHOL 168  HDL 56  LDLCALC 99  TRIG 63  CHOLHDL 3.0   Urine analysis:    Component Value Date/Time   COLORURINE STRAW (A) 07/05/2019 1513     APPEARANCEUR CLEAR 07/05/2019 1513   LABSPEC 1.014 07/05/2019 1513   PHURINE 5.0 07/05/2019 1513   GLUCOSEU >=500 (A) 07/05/2019 1513   HGBUR SMALL (A) 07/05/2019 1513   BILIRUBINUR NEGATIVE 07/05/2019 1513   KETONESUR NEGATIVE 07/05/2019 1513   PROTEINUR NEGATIVE 07/05/2019 1513   UROBILINOGEN 2.0 (H) 10/21/2014 1754   NITRITE NEGATIVE 07/05/2019 1513   LEUKOCYTESUR NEGATIVE 07/05/2019 1513    Recent Results (from the past 240 hour(s))  SARS Coronavirus 2 [facility] order, Performed in [facility] hospital lab) Nasopharyngeal Nasopharyngeal Swab     Status: None   Collection Time: 07/05/19  4:18 PM   Specimen: Nasopharyngeal Swab  Result Value Ref Range Status   SARS Coronavirus 2 NEGATIVE NEGATIVE Final    Comment: (NOTE) If result is NEGATIVE SARS-CoV-2 target nucleic acids are NOT DETECTED. The SARS-CoV-2 RNA is generally detectable in upper and lower  respiratory specimens during the acute phase of infection. The lowest  concentration of SARS-CoV-2 viral copies this assay can detect is 250  copies / mL. A negative result does not preclude SARS-CoV-2  infection  and should not be used as the sole basis for treatment or other  patient management decisions.  A negative result may occur with  improper specimen collection / handling, submission of specimen other  than nasopharyngeal swab, presence of viral mutation(s) within the  areas targeted by this assay, and inadequate number of viral copies  (<250 copies / mL). A negative result must be combined with clinical  observations, patient history, and epidemiological information. If result is POSITIVE SARS-CoV-2 target nucleic acids are DETECTED. The SARS-CoV-2 RNA is generally detectable in upper and lower  respiratory specimens dur ing the acute phase of infection.  Positive  results are indicative of active infection with SARS-CoV-2.  Clinical  correlation with patient history and other diagnostic information is  necessary  to determine patient infection status.  Positive results do  not rule out bacterial infection or co-infection with other viruses. If result is PRESUMPTIVE POSTIVE SARS-CoV-2 nucleic acids MAY BE PRESENT.   A presumptive positive result was obtained on the submitted specimen  and confirmed on repeat testing.  While 2019 novel coronavirus  (SARS-CoV-2) nucleic acids may be present in the submitted sample  additional confirmatory testing may be necessary for epidemiological  and / or clinical management purposes  to differentiate between  SARS-CoV-2 and other Sarbecovirus currently known to infect humans.  If clinically indicated additional testing with an alternate test  methodology [phone]) is advised. The SARS-CoV-2 RNA is generally  detectable in upper and lower respiratory sp ecimens during the acute  phase of infection. The expected result is Negative. Fact Sheet for Patients:  StrictlyIdeas.no Fact Sheet for Healthcare Providers: BankingDealers.co.za This test is not yet approved or cleared by the [place] FDA and has been authorized for detection and/or diagnosis of SARS-CoV-2 by FDA under an Emergency Use Authorization (EUA).  This EUA will remain in effect (meaning this test can be used) for the duration of the COVID-19 declaration under Section 564(b)(1) of the Act, 21 U.S.C. section 360bbb-3(b)(1), unless the authorization is terminated or revoked sooner. Performed at [facility], [address] [place], [place] [address]      Radiology Studies: Mr Brain Wo Contrast  Result Date: 07/05/2019 CLINICAL DATA:  Altered mental status. Left-sided weakness, facial droop, and vision loss. EXAM: MRI HEAD WITHOUT CONTRAST TECHNIQUE: Multiplanar, multiecho pulse sequences of the brain and surrounding structures were obtained without intravenous contrast. COMPARISON:  Head CT 07/05/2019 and MRI 01/02/2018 FINDINGS: The examination had  to be discontinued prior to completion due to the patient's inability to tolerate the examination including attempts to climb out of the scanner. Axial and coronal diffusion, sagittal T1, axial T2*, and axial T2 sequences were obtained. The study is motion degraded, including severe motion on the axial T2 sequence. Brain: Diffusion imaging is of diagnostic quality, and there is no evidence of acute infarct. No intracranial hemorrhage, intracranial mass effect, or extra-axial fluid collection is identified. Mildly age advanced cerebral atrophy is noted. Assessment of chronic cerebral white matter disease is limited due to motion and incomplete imaging. Vascular: Poorly evaluated due to motion. Skull and upper cervical spine: No suspicious marrow lesion. Sinuses/Orbits: Bilateral cataract extraction. No evidence of significant acute inflammatory sinus disease. Other: None. IMPRESSION: Motion degraded, incomplete examination without evidence of acute abnormality. Electronically Signed   By: Logan Bores M.D.   On: 07/05/2019 16:30   Mr Jeri Cos WJ Contrast  Result Date: 07/06/2019 CLINICAL DATA:  New onset seizure with transient left-sided weakness. History of prostate  cancer. EXAM: MRI HEAD WITHOUT AND WITH CONTRAST TECHNIQUE: Multiplanar, multiecho pulse sequences of the brain and surrounding structures were obtained without and with intravenous contrast. CONTRAST:  10 mL Gadavist COMPARISON:  Incomplete noncontrast brain MRI 07/05/2019 FINDINGS: The study is mildly motion degraded. Brain: There is no evidence of acute infarct, intracranial hemorrhage, mass, midline shift, or extra-axial fluid collection. Moderate generalized cerebral atrophy is advanced for age. Periventricular white matter T2 hyperintensities are nonspecific but compatible with mild chronic small vessel ischemic disease. The hippocampi are symmetric in size and signal. No abnormal enhancement is identified. Vascular: Major intracranial vascular  flow voids are preserved. Skull and upper cervical spine: No suspicious marrow lesion. Sinuses/Orbits: Bilateral cataract extraction. Small left maxillary sinus mucous retention cyst. Clear mastoid air cells. Other: None. IMPRESSION: 1. No acute intracranial abnormality or mass. 2. Mild chronic small vessel ischemic disease and moderate cerebral atrophy. Electronically Signed   By: Logan Bores M.D.   On: 07/06/2019 08:08   US Carotid Bilateral  Result Date: 07/06/2019 CLINICAL DATA:  Altered mental status, hypertension weakness EXAM: BILATERAL CAROTID DUPLEX ULTRASOUND TECHNIQUE: Pearline Cables scale imaging, color Doppler and duplex ultrasound were performed of bilateral carotid and vertebral arteries in the neck. COMPARISON:  None. FINDINGS: Criteria: Quantification of carotid stenosis is based on velocity parameters that correlate the residual internal carotid diameter with NASCET-based stenosis levels, using the diameter of the distal internal carotid lumen as the denominator for stenosis measurement. The following velocity measurements were obtained: RIGHT ICA: 96/27 cm/sec CCA: 30/8 cm/sec SYSTOLIC ICA/CCA RATIO:  1.2 ECA: 64 cm/sec LEFT ICA: 86/20 cm/sec CCA: 65/7 cm/sec SYSTOLIC ICA/CCA RATIO:  0.9 ECA: 66 cm/sec RIGHT CAROTID ARTERY: Minor echogenic shadowing plaque formation. No hemodynamically significant right ICA stenosis, velocity elevation, or turbulent flow. Degree of narrowing less than 50%. RIGHT VERTEBRAL ARTERY:  Antegrade LEFT CAROTID ARTERY: Similar scattered minor echogenic plaque formation. No hemodynamically significant left ICA stenosis, velocity elevation, or turbulent flow. LEFT VERTEBRAL ARTERY:  Antegrade IMPRESSION: Minor carotid atherosclerosis. No hemodynamically significant ICA stenosis by ultrasound. Degree of narrowing less than 50% bilaterally. Patent antegrade vertebral flow bilaterally Electronically Signed   By: Jerilynn Mages.  Shick M.D.   On: 07/06/2019 13:05   Ct Head Code Stroke Wo  Contrast  Result Date: 07/05/2019 CLINICAL DATA:  Code stroke. Altered level of consciousness. Seizure like activity. EXAM: CT HEAD WITHOUT CONTRAST TECHNIQUE: Contiguous axial images were obtained from the base of the skull through the vertex without intravenous contrast. COMPARISON:  Head MRI 01/02/2018 and CT 02/15/2017 FINDINGS: The study is mildly motion degraded. Brain: There is no evidence of acute infarct, intracranial hemorrhage, mass, midline shift, or extra-axial fluid collection. There is mildly age advanced cerebral atrophy. Cerebral white matter hypodensities are nonspecific but compatible with mild chronic small vessel ischemic disease, stable to mildly progressed from the prior CT. Vascular: Calcified atherosclerosis at the skull base. No hyperdense vessel. Skull: No fracture or focal osseous lesion is identified although a portion of the left frontal skull was excluded. Sinuses/Orbits: Visualized paranasal sinuses and mastoid air cells are clear. Bilateral cataract extraction is noted. Other: None. ASPECTS [facility] Stroke Program Early CT Score) Not scored with this history. IMPRESSION: 1. No evidence of acute intracranial abnormality. 2. Mild chronic small vessel ischemic disease and cerebral atrophy. These results were called by telephone at the time of interpretation on 07/05/2019 at 3:39 pm to Dr. Milton Ferguson , who verbally acknowledged these results. Electronically Signed   By: Seymour Bars.D.  On: 07/05/2019 15:39    Scheduled Meds:  atorvastatin  40 mg Oral q1800   donepezil  10 mg Oral QHS   enoxaparin (LOVENOX) injection  40 mg Subcutaneous D80I   folic acid  1 mg Oral Daily   insulin aspart  0-5 Units Subcutaneous QHS   [START ON 07/07/2019] insulin aspart  0-9 Units Subcutaneous TID WC   insulin detemir  20 Units Subcutaneous QHS   LORazepam  0.5 mg Intravenous Once   multivitamin with minerals  1 tablet Oral Daily   thiamine  100 mg Oral Daily   Or   thiamine   100 mg Intravenous Daily   Continuous Infusions:  levETIRAcetam       LOS: 0 days    Time spent: 30 minutes   Barton Dubois, MD [facility] Pager [phone]   07/06/2019, 5:03 PM

## 2019-07-06 NOTE — Evaluation (Signed)
Physical Therapy Evaluation Patient Details Name: Jeffery [name] MRN: [id number] DOB: [birth year] Today's Date: 07/06/2019   History of Present Illness  Jeffery [name] is a 68 y.o. male with medical history significant for prostate cancer, diabetes, hypertension, who was brought to the ED via EMS with reports of altered mental status and left-sided weakness.  History is obtained from spouse who is at bedside, as patient cannot remember the events of what happened.  At the time of my evaluation patient is somewhat drowsy but responding to most questions appropriately and following most directions.At about 2:30 PM this afternoon, spouse had given patient lunch to eat, when she heard a noise, when she arrived patient had dropped his food on the floor, knocked over the table and the contents in it.  She called patient's name but patient was not responding.  His eyes were closed, there was a slight droop to the right side of his face (triage intake notes reports left side) and what looked like a little bit of foamy sputum present at the side of his mouth.  She did not witness any jerking activity, she is unsure if there was jerking activity before she got there.  She denies a history of seizures in the patient.No fever no chills, no difficulty breathing no cough, no complaints of pain.  No vomiting no loose stools, unchanged oral intake.  Patient intermittently self caths, as he occasionally has blood clots in his urine.He has a history of memory loss, but at baseline patient is independent of all his ADLs, can hold a conversation, for the most part recognizes family, ambulates without assistance    Clinical Impression  Patient functioning at baseline for functional mobility and gait, slightly unsteady initially during gait training, able to self correct and had no problems ambulating in room, hallway and on stairs.  Plan:  Patient discharged from physical therapy to care of nursing for ambulation daily as tolerated  for length of stay.     Follow Up Recommendations No PT follow up    Equipment Recommendations  None recommended by PT    Recommendations for Other Services       Precautions / Restrictions Precautions Precautions: None Restrictions Weight Bearing Restrictions: No      Mobility  Bed Mobility Overal bed mobility: Modified Independent             General bed mobility comments: increased time  Transfers Overall transfer level: Modified independent               General transfer comment: increased time  Ambulation/Gait Ambulation/Gait assistance: Modified independent (Device/Increase time) Gait Distance (Feet): 200 Feet Assistive device: None Gait Pattern/deviations: WFL(Within Functional Limits) Gait velocity: slightly decreased   General Gait Details: demonstrates good return for ambulation on level, inclined and declined surfaces without loss of balance  Stairs Stairs: Yes   Stair Management: Two rails;Alternating pattern Number of Stairs: 9 General stair comments: Patient demonstrates good return for going up/down stairs using 2 siderails without loss of balance  Wheelchair Mobility    Modified Rankin (Stroke Patients Only)       Balance Overall balance assessment: Mild deficits observed, not formally tested                                           Pertinent Vitals/Pain Pain Assessment: No/denies pain    Home Living Family/patient expects to  be discharged to:: Private residence Living Arrangements: Spouse/significant other Available Help at Discharge: Family Type of Home: House Home Access: Stairs to enter Entrance Stairs-Rails: None Technical brewer of Steps: 1-2 Home Layout: One level Home Equipment: None      Prior Function Level of Independence: Independent         Comments: Hydrographic surveyor, does not drive     Hand Dominance        Extremity/Trunk Assessment   Upper Extremity  Assessment Upper Extremity Assessment: Overall WFL for tasks assessed    Lower Extremity Assessment Lower Extremity Assessment: Overall WFL for tasks assessed    Cervical / Trunk Assessment Cervical / Trunk Assessment: Normal  Communication   Communication: No difficulties  Cognition Arousal/Alertness: Awake/alert Behavior During Therapy: WFL for tasks assessed/performed Overall Cognitive Status: Within Functional Limits for tasks assessed                                        General Comments      Exercises     Assessment/Plan    PT Assessment Patent does not need any further PT services  PT Problem List         PT Treatment Interventions      PT Goals (Current goals can be found in the Care Plan section)  Acute Rehab PT Goals Patient Stated Goal: return home PT Goal Formulation: With patient Time For Goal Achievement: 07/06/19 Potential to Achieve Goals: Good    Frequency     Barriers to discharge        Co-evaluation               AM-PAC PT "6 Clicks" Mobility  Outcome Measure Help needed turning from your back to your side while in a flat bed without using bedrails?: None Help needed moving from lying on your back to sitting on the side of a flat bed without using bedrails?: None Help needed moving to and from a bed to a chair (including a wheelchair)?: None Help needed standing up from a chair using your arms (e.g., wheelchair or bedside chair)?: None Help needed to walk in hospital room?: None Help needed climbing 3-5 steps with a railing? : None 6 Click Score: 24    End of Session   Activity Tolerance: Patient tolerated treatment well Patient left: in chair;with call bell/phone within reach Nurse Communication: Mobility status PT Visit Diagnosis: Unsteadiness on feet (R26.81);Other abnormalities of gait and mobility (R26.89);Muscle weakness (generalized) (M62.81)    Time: 1027-2536 PT Time Calculation (min) (ACUTE ONLY):  24 min   Charges:   PT Evaluation $PT Eval Moderate Complexity: 1 Mod PT Treatments $Therapeutic Activity: 23-37 mins        12:21 PM, 07/06/19 Lonell Grandchild, MPT Physical Therapist with [facility] [phone] office [phone] mobile phone

## 2019-07-06 NOTE — Procedures (Signed)
Patient Name: KEISUKE HOLLABAUGH  MRN: [id number]  Epilepsy Attending: Lora Havens  Referring Physician/Provider: Dr Jenetta Downer Date: 07/06/2019 Duration: 23.50 mins  Patient history: 68yo M with ams. EEG to evaluate for seizure  Level of alertness:   Technical aspects: This EEG study was done with scalp electrodes positioned according to the 10-20 International system of electrode placement. Electrical activity was acquired at a sampling rate of 500Hz  and reviewed with a high frequency filter of 70Hz  and a low frequency filter of 1Hz . EEG data were recorded continuously and digitally stored.   DESCRIPTION: Sleep was characterized by sleep spindles(12-14hz ), maximal frontocentral. There was also continuous generalized 3-6hz  theta-delta slowing. Hyperventilation and photic stimulation were not performed.  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 No seizures or epileptiform discharges were seen throughout the recording.  Edilberto Roosevelt Barbra Sarks

## 2019-07-06 NOTE — Progress Notes (Signed)
Patient was walked to the bathroom to use the toilet, patient had seizure activity while on the toilet. This nurse was the only staff member there during event. Event lasted about 3 minutes. Patient was not able to respond verbally during event. Ativan was not able to be given due to late response from staff. Patient was responsive and confused as to where he was and what day it was. Wife was present for entire event and has concerns, MD paged and made aware.

## 2019-07-06 NOTE — Progress Notes (Signed)
Inpatient Diabetes Program Recommendations  AACE/ADA: New Consensus Statement on Inpatient Glycemic Control (2015)  Target Ranges:  Prepandial:   less than 140 mg/dL      Peak postprandial:   less than 180 mg/dL (1-2 hours)      Critically ill patients:  140 - 180 mg/dL   Lab Results  Component Value Date   GLUCAP 358 (H) 07/05/2019   HGBA1C 12.8 (H) 02/16/2017    Review of Glycemic Control  Diabetes history: DM2 Outpatient Diabetes medications: Levemir 20-30 units QHS, Amaryl 2 mg QAM Current orders for Inpatient glycemic control: Levemir 30 units QHS, Amaryl 2 mg QAM  HgbA57C over 23 years old. Needs updating.  Inpatient Diabetes Program Recommendations:     Add Novolog 0-9 units tidwc and hs Updated HgbA1C to assess glycemic control prior to admission.  Will follow.  Thank you. Lorenda Peck, RD, LDN, CDE Inpatient Diabetes Coordinator [phone]

## 2019-07-06 NOTE — Progress Notes (Signed)
*  PRELIMINARY RESULTS* Echocardiogram 2D Echocardiogram has been performed.  Jeffery [name] 07/06/2019, 1:16 PM

## 2019-07-06 NOTE — Progress Notes (Signed)
Echo tech was at bedside and called for nurse, patient was having seizure activity. This seizure activity lasted for about 2 minutes. No injury to patient occurred. Patient is alert and initially disoriented to place but easily reoriented. Patient is now stable. MD was paged via amion to make aware.

## 2019-07-07 LAB — GLUCOSE, CAPILLARY
Glucose-Capillary: 346 mg/dL — ABNORMAL HIGH (ref 70–99)
Glucose-Capillary: 364 mg/dL — ABNORMAL HIGH (ref 70–99)
Glucose-Capillary: 172 mg/dL — ABNORMAL HIGH (ref 70–99)
Glucose-Capillary: 246 mg/dL — ABNORMAL HIGH (ref 70–99)

## 2019-07-07 LAB — HIV ANTIBODY (ROUTINE TESTING W REFLEX): HIV Screen 4th Generation wRfx: NONREACTIVE

## 2019-07-07 LAB — PROLACTIN: Prolactin: 11.9 ng/mL (ref 4.0–15.2)

## 2019-07-07 LAB — VITAMIN B12: Vitamin B-12: 269 pg/mL (ref 180–914)

## 2019-07-07 LAB — TSH: TSH: 2.706 u[IU]/mL (ref 0.350–4.500)

## 2019-07-07 MED ORDER — MEMANTINE HCL 10 MG PO TABS
10.0000 mg | ORAL_TABLET | Freq: Two times a day (BID) | ORAL | Status: DC
  Administered 2019-07-07 – 2019-07-08 (×3): 10 mg via ORAL
  Filled 2019-07-07 (×3): qty 1

## 2019-07-07 MED ORDER — VITAMIN B-12 1000 MCG PO TABS
1000.0000 ug | ORAL_TABLET | Freq: Every day | ORAL | Status: DC
  Administered 2019-07-07 – 2019-07-08 (×2): 1000 ug via ORAL
  Filled 2019-07-07 (×2): qty 1

## 2019-07-07 NOTE — Progress Notes (Signed)
PROGRESS NOTE    Jeffery [name]  HAL:[id number] DOB: 1951/03/22 DOA: 07/05/2019 PCP: [name], MD     Brief Narrative:  68 y.o. male with medical history significant for prostate cancer, diabetes, hypertension, who was brought to the ED via EMS with reports of altered mental status and left-sided weakness.  History is obtained from spouse who is at bedside, as patient cannot remember the events of what happened.  At the time of my evaluation patient is somewhat drowsy but responding to most questions appropriately and following most directions. At about 2:30 PM this afternoon, spouse had given patient lunch to eat, when she heard a noise, when she arrived patient had dropped his food on the floor, knocked over the table and the contents in it.  She called patient's name but patient was not responding.  His eyes were closed, there was a slight droop to the right side of his face (triage intake notes reports left side) and what looked like a little bit of foamy sputum present at the side of his mouth.  She did not witness any jerking activity, she is unsure if there was jerking activity before she got there.  She denies a history of seizures in the patient. No fever no chills, no difficulty breathing no cough, no complaints of pain.  No vomiting no loose stools, unchanged oral intake.  Patient intermittently self caths, as he occasionally has blood clots in his urine. He has a history of memory loss, but at baseline patient is independent of all his ADLs, can hold a conversation, for the most part recognizes family, ambulates without assistance.  Assessment & Plan: 1-seizure-like activity -Patient with history of alcohol abuse -New onset seizure activity -Per wife has not stopped drinking and in fact the day of admission was actually drinking wine -EEG unrevealing. -CT scan and MRI of the head negative for acute abnormalities -Following neurology recommendation we will continue the use of Keppra  1000 mg twice a day. -CK level and prolactin level negative.  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  3-alcohol abuse -Cessation counseling has been provided -Continue thiamine and folic acid -Continue CIWA protocol  4-hyperlipidemia -Continue statins.  5-history of dementia -Continue Aricept and supportive care  6-type 2 diabetes with hyperglycemia -A1c 11.7 demonstrating uncontrolled diabetes -Continue Levemir and sliding scale insulin -Modified carbohydrate diet has been ordered. -Follow CBGs and adjust insulin therapy as needed. -Hold oral hypoglycemic agents while inpatient.   DVT prophylaxis: Lovenox Code Status: Full code Family Communication: Wife updated over the phone. Disposition Plan: To be determined.  Continue adjusted dose of Keppra, follow neurology recommendations, EEG unrevealing.  Stroke work-up demonstrating no acute ischemic changes on his brain.   Consultants:   Neurology service  Procedures:   See below for x-ray reports.  Antimicrobials:  Anti-infectives (From admission, onward)   None      Subjective: Last episode of seizure activity around 11 PM on 07/06/2019.  No chest pain, no shortness of breath, no nausea, no vomiting.  No signs of acute withdrawal appreciated.  Objective: Vitals:   07/07/19 0003 07/07/19 0556 07/07/19 1200 07/07/19 1305  BP: 140/79 138/70 140/79 127/70  Pulse: (!) 59 60 (!) 59 60  Resp: 19 19 17 19   Temp: (!) 97.5 F (36.4 C) (!) 97.5 F (36.4 C) 97.9 F (36.6  C) 98 F (36.7 C)  TempSrc: Oral Oral Oral Oral  SpO2: 100% 100% 98% 99%  Weight:      Height:        Intake/Output Summary (Last 24 hours) at 07/07/2019 1603 Last data filed at 07/07/2019 1100 Gross per 24 hour  Intake 840 ml  Output 700 ml  Net 140 ml    Filed Weights   07/06/19 0830  Weight: 91.4 kg    Examination: General exam: Alert, awake, oriented x 2; denies chest pain, no shortness of breath, no nausea, no vomiting.  Patient last episode of seizure-like activity at 11 PM last night.  Hemodynamically stable.  Respiratory system: Clear to auscultation. Respiratory effort normal. Cardiovascular system:RRR. No murmurs, rubs, gallops. Gastrointestinal system: Abdomen is nondistended, soft and nontender. No organomegaly or masses felt. Normal bowel sounds heard. Central nervous system: Alert and oriented X2. No focal neurological deficits. Extremities: No C/C/E, +pedal pulses Skin: No rashes, lesions or ulcers Psychiatry: Judgement and insight appear impaired in the setting of underlying dementia.  Data Reviewed: I have personally reviewed following labs and imaging studies  CBC: Recent Labs  Lab 07/05/19 1538  WBC 5.2  NEUTROABS 3.0  HGB 10.6*  HCT 35.1*  MCV 90.0  PLT 229   Basic Metabolic Panel: Recent Labs  Lab 07/05/19 1538 07/06/19 0426  NA 134* 137  K 4.1 4.0  CL 104 106  CO2 19* 23  GLUCOSE 371* 312*  BUN 23 23  CREATININE 1.52* 1.41*  CALCIUM 9.0 9.1  MG 2.0  --   PHOS 4.0  --    GFR: Estimated Creatinine Clearance: 58.3 mL/min (A) (by C-G formula based on SCr of 1.41 mg/dL (H)).   Liver Function Tests: Recent Labs  Lab 07/05/19 1538  AST 19  ALT 13  ALKPHOS 97  BILITOT 1.0  PROT 7.8  ALBUMIN 3.4*   Coagulation Profile: Recent Labs  Lab 07/05/19 1538  INR 1.1   Cardiac Enzymes: Recent Labs  Lab 07/06/19 0426  CKTOTAL 118   HbA1C: Recent Labs    07/05/19 1538  HGBA1C 11.5*   CBG: Recent Labs  Lab 07/05/19 1532 07/06/19 1724 07/06/19 2222 07/07/19 0727 07/07/19 1101  GLUCAP 358* 378* 413* 346* 364*   Lipid Profile: Recent Labs    07/06/19 0426  CHOL 168  HDL 56  LDLCALC 99  TRIG 63  CHOLHDL 3.0   Urine analysis:    Component Value Date/Time   COLORURINE  STRAW (A) 07/05/2019 1513   APPEARANCEUR CLEAR 07/05/2019 1513   LABSPEC 1.014 07/05/2019 1513   PHURINE 5.0 07/05/2019 1513   GLUCOSEU >=500 (A) 07/05/2019 1513   HGBUR SMALL (A) 07/05/2019 1513   BILIRUBINUR NEGATIVE 07/05/2019 1513   KETONESUR NEGATIVE 07/05/2019 1513   PROTEINUR NEGATIVE 07/05/2019 1513   UROBILINOGEN 2.0 (H) 10/21/2014 1754   NITRITE NEGATIVE 07/05/2019 1513   LEUKOCYTESUR NEGATIVE 07/05/2019 1513    Recent Results (from the past 240 hour(s))  SARS Coronavirus 2 [facility] order, Performed in [facility] hospital lab) Nasopharyngeal Nasopharyngeal Swab     Status: None   Collection Time: 07/05/19  4:18 PM   Specimen: Nasopharyngeal Swab  Result Value Ref Range Status   SARS Coronavirus 2 NEGATIVE NEGATIVE Final    Comment: (NOTE) If result is NEGATIVE SARS-CoV-2 target nucleic acids are NOT DETECTED. The SARS-CoV-2 RNA is generally detectable in upper and lower  respiratory specimens during the acute phase of infection. The lowest  concentration of SARS-CoV-2 viral copies this assay  can detect is 250  copies / mL. A negative result does not preclude SARS-CoV-2 infection  and should not be used as the sole basis for treatment or other  patient management decisions.  A negative result may occur with  improper specimen collection / handling, submission of specimen other  than nasopharyngeal swab, presence of viral mutation(s) within the  areas targeted by this assay, and inadequate number of viral copies  (<250 copies / mL). A negative result must be combined with clinical  observations, patient history, and epidemiological information. If result is POSITIVE SARS-CoV-2 target nucleic acids are DETECTED. The SARS-CoV-2 RNA is generally detectable in upper and lower  respiratory specimens dur ing the acute phase of infection.  Positive  results are indicative of active infection with SARS-CoV-2.  Clinical  correlation with patient history and other diagnostic  information is  necessary to determine patient infection status.  Positive results do  not rule out bacterial infection or co-infection with other viruses. If result is PRESUMPTIVE POSTIVE SARS-CoV-2 nucleic acids MAY BE PRESENT.   A presumptive positive result was obtained on the submitted specimen  and confirmed on repeat testing.  While 2019 novel coronavirus  (SARS-CoV-2) nucleic acids may be present in the submitted sample  additional confirmatory testing may be necessary for epidemiological  and / or clinical management purposes  to differentiate between  SARS-CoV-2 and other Sarbecovirus currently known to infect humans.  If clinically indicated additional testing with an alternate test  methodology [phone]) is advised. The SARS-CoV-2 RNA is generally  detectable in upper and lower respiratory sp ecimens during the acute  phase of infection. The expected result is Negative. Fact Sheet for Patients:  StrictlyIdeas.no Fact Sheet for Healthcare Providers: BankingDealers.co.za This test is not yet approved or cleared by the [place] FDA and has been authorized for detection and/or diagnosis of SARS-CoV-2 by FDA under an Emergency Use Authorization (EUA).  This EUA will remain in effect (meaning this test can be used) for the duration of the COVID-19 declaration under Section 564(b)(1) of the Act, 21 U.S.C. section 360bbb-3(b)(1), unless the authorization is terminated or revoked sooner. Performed at [facility], [address] [place], [place] [address]      Radiology Studies: Mr Brain Wo Contrast  Result Date: 07/05/2019 CLINICAL DATA:  Altered mental status. Left-sided weakness, facial droop, and vision loss. EXAM: MRI HEAD WITHOUT CONTRAST TECHNIQUE: Multiplanar, multiecho pulse sequences of the brain and surrounding structures were obtained without intravenous contrast. COMPARISON:  Head CT 07/05/2019 and MRI 01/02/2018  FINDINGS: The examination had to be discontinued prior to completion due to the patient's inability to tolerate the examination including attempts to climb out of the scanner. Axial and coronal diffusion, sagittal T1, axial T2*, and axial T2 sequences were obtained. The study is motion degraded, including severe motion on the axial T2 sequence. Brain: Diffusion imaging is of diagnostic quality, and there is no evidence of acute infarct. No intracranial hemorrhage, intracranial mass effect, or extra-axial fluid collection is identified. Mildly age advanced cerebral atrophy is noted. Assessment of chronic cerebral white matter disease is limited due to motion and incomplete imaging. Vascular: Poorly evaluated due to motion. Skull and upper cervical spine: No suspicious marrow lesion. Sinuses/Orbits: Bilateral cataract extraction. No evidence of significant acute inflammatory sinus disease. Other: None. IMPRESSION: Motion degraded, incomplete examination without evidence of acute abnormality. Electronically Signed   By: Logan Bores M.D.   On: 07/05/2019 16:30   Mr Jeri Cos VC Contrast  Result  Date: 07/06/2019 CLINICAL DATA:  New onset seizure with transient left-sided weakness. History of prostate cancer. EXAM: MRI HEAD WITHOUT AND WITH CONTRAST TECHNIQUE: Multiplanar, multiecho pulse sequences of the brain and surrounding structures were obtained without and with intravenous contrast. CONTRAST:  10 mL Gadavist COMPARISON:  Incomplete noncontrast brain MRI 07/05/2019 FINDINGS: The study is mildly motion degraded. Brain: There is no evidence of acute infarct, intracranial hemorrhage, mass, midline shift, or extra-axial fluid collection. Moderate generalized cerebral atrophy is advanced for age. Periventricular white matter T2 hyperintensities are nonspecific but compatible with mild chronic small vessel ischemic disease. The hippocampi are symmetric in size and signal. No abnormal enhancement is identified. Vascular:  Major intracranial vascular flow voids are preserved. Skull and upper cervical spine: No suspicious marrow lesion. Sinuses/Orbits: Bilateral cataract extraction. Small left maxillary sinus mucous retention cyst. Clear mastoid air cells. Other: None. IMPRESSION: 1. No acute intracranial abnormality or mass. 2. Mild chronic small vessel ischemic disease and moderate cerebral atrophy. Electronically Signed   By: Logan Bores M.D.   On: 07/06/2019 08:08   US Carotid Bilateral  Result Date: 07/06/2019 CLINICAL DATA:  Altered mental status, hypertension weakness EXAM: BILATERAL CAROTID DUPLEX ULTRASOUND TECHNIQUE: Pearline Cables scale imaging, color Doppler and duplex ultrasound were performed of bilateral carotid and vertebral arteries in the neck. COMPARISON:  None. FINDINGS: Criteria: Quantification of carotid stenosis is based on velocity parameters that correlate the residual internal carotid diameter with NASCET-based stenosis levels, using the diameter of the distal internal carotid lumen as the denominator for stenosis measurement. The following velocity measurements were obtained: RIGHT ICA: 96/27 cm/sec CCA: 16/9 cm/sec SYSTOLIC ICA/CCA RATIO:  1.2 ECA: 64 cm/sec LEFT ICA: 86/20 cm/sec CCA: 45/0 cm/sec SYSTOLIC ICA/CCA RATIO:  0.9 ECA: 66 cm/sec RIGHT CAROTID ARTERY: Minor echogenic shadowing plaque formation. No hemodynamically significant right ICA stenosis, velocity elevation, or turbulent flow. Degree of narrowing less than 50%. RIGHT VERTEBRAL ARTERY:  Antegrade LEFT CAROTID ARTERY: Similar scattered minor echogenic plaque formation. No hemodynamically significant left ICA stenosis, velocity elevation, or turbulent flow. LEFT VERTEBRAL ARTERY:  Antegrade IMPRESSION: Minor carotid atherosclerosis. No hemodynamically significant ICA stenosis by ultrasound. Degree of narrowing less than 50% bilaterally. Patent antegrade vertebral flow bilaterally Electronically Signed   By: Jerilynn Mages.  Shick M.D.   On: 07/06/2019 13:05     Scheduled Meds:  aspirin EC  81 mg Oral Daily   atorvastatin  40 mg Oral q1800   donepezil  10 mg Oral QHS   enoxaparin (LOVENOX) injection  40 mg Subcutaneous T88E   folic acid  1 mg Oral Daily   insulin aspart  0-5 Units Subcutaneous QHS   insulin aspart  0-9 Units Subcutaneous TID WC   insulin detemir  20 Units Subcutaneous QHS   LORazepam  0.5 mg Intravenous Once   memantine  10 mg Oral BID   multivitamin with minerals  1 tablet Oral Daily   thiamine  100 mg Oral Daily   Or   thiamine  100 mg Intravenous Daily   vitamin B-12  1,000 mcg Oral Daily   Continuous Infusions:  levETIRAcetam 1,000 mg (07/07/19 1538)     LOS: 1 day    Time spent: 30 minutes   Barton Dubois, MD [facility] Pager [phone]   07/07/2019, 4:03 PM

## 2019-07-08 ENCOUNTER — Other Ambulatory Visit: Payer: Self-pay

## 2019-07-08 ENCOUNTER — Encounter (HOSPITAL_COMMUNITY): Payer: Self-pay | Admitting: *Deleted

## 2019-07-08 ENCOUNTER — Emergency Department (HOSPITAL_COMMUNITY)
Admission: EM | Admit: 2019-07-08 | Discharge: 2019-07-09 | Disposition: A | Discharge status: Home / Self Care | Payer: Medicare Other | Attending: Emergency Medicine | Admitting: Emergency Medicine

## 2019-07-08 DIAGNOSIS — F1010 Alcohol abuse, uncomplicated: Secondary | ICD-10-CM

## 2019-07-08 DIAGNOSIS — N1831 Chronic kidney disease, stage 3a: Secondary | ICD-10-CM

## 2019-07-08 DIAGNOSIS — R319 Hematuria, unspecified: Secondary | ICD-10-CM | POA: Insufficient documentation

## 2019-07-08 DIAGNOSIS — F0281 Dementia in other diseases classified elsewhere with behavioral disturbance: Secondary | ICD-10-CM | POA: Insufficient documentation

## 2019-07-08 DIAGNOSIS — G309 Alzheimer's disease, unspecified: Secondary | ICD-10-CM

## 2019-07-08 DIAGNOSIS — Z7982 Long term (current) use of aspirin: Secondary | ICD-10-CM | POA: Insufficient documentation

## 2019-07-08 DIAGNOSIS — N390 Urinary tract infection, site not specified: Secondary | ICD-10-CM | POA: Insufficient documentation

## 2019-07-08 DIAGNOSIS — G40909 Epilepsy, unspecified, not intractable, without status epilepticus: Secondary | ICD-10-CM | POA: Diagnosis not present

## 2019-07-08 DIAGNOSIS — Z79899 Other long term (current) drug therapy: Secondary | ICD-10-CM | POA: Insufficient documentation

## 2019-07-08 DIAGNOSIS — I10 Essential (primary) hypertension: Secondary | ICD-10-CM | POA: Insufficient documentation

## 2019-07-08 DIAGNOSIS — Z87891 Personal history of nicotine dependence: Secondary | ICD-10-CM | POA: Insufficient documentation

## 2019-07-08 DIAGNOSIS — E785 Hyperlipidemia, unspecified: Secondary | ICD-10-CM

## 2019-07-08 DIAGNOSIS — E119 Type 2 diabetes mellitus without complications: Secondary | ICD-10-CM | POA: Insufficient documentation

## 2019-07-08 DIAGNOSIS — Z794 Long term (current) use of insulin: Secondary | ICD-10-CM | POA: Insufficient documentation

## 2019-07-08 LAB — URINALYSIS, ROUTINE W REFLEX MICROSCOPIC
Bacteria, UA: NONE SEEN
Bilirubin Urine: NEGATIVE
Glucose, UA: >=500 mg/dL — AB
Ketones, ur: NEGATIVE mg/dL
Leukocytes,Ua: NEGATIVE
Nitrite: NEGATIVE
Protein, ur: NEGATIVE mg/dL
RBC / HPF: >50 RBC/hpf — ABNORMAL HIGH (ref 0–5)
Specific Gravity, Urine: 1.02 (ref 1.005–1.030)
WBC, UA: >50 WBC/hpf — ABNORMAL HIGH (ref 0–5)
pH: 6 (ref 5.0–8.0)

## 2019-07-08 LAB — GLUCOSE, CAPILLARY
Glucose-Capillary: 304 mg/dL — ABNORMAL HIGH (ref 70–99)
Glucose-Capillary: 360 mg/dL — ABNORMAL HIGH (ref 70–99)

## 2019-07-08 LAB — RPR: RPR Ser Ql: NONREACTIVE

## 2019-07-08 MED ORDER — ASPIRIN 81 MG PO TBEC: 81.0000 mg | DELAYED_RELEASE_TABLET | Freq: Every day | ORAL | 2 refills | Status: AC

## 2019-07-08 MED ORDER — CYANOCOBALAMIN 1000 MCG PO TABS: 1000.0000 ug | ORAL_TABLET | Freq: Every day | ORAL | 1 refills | Status: DC

## 2019-07-08 MED ORDER — LEVETIRACETAM 1000 MG PO TABS: 1000.0000 mg | ORAL_TABLET | Freq: Two times a day (BID) | ORAL | 1 refills | Status: DC

## 2019-07-08 MED ORDER — LORAZEPAM 2 MG/ML IJ SOLN
INTRAMUSCULAR | Status: AC
  Filled 2019-07-08: qty 1

## 2019-07-08 MED ORDER — ADULT MULTIVITAMIN W/MINERALS CH: 1.0000 | ORAL_TABLET | Freq: Every day | ORAL | 1 refills | Status: DC

## 2019-07-08 MED ORDER — LORAZEPAM 2 MG/ML IJ SOLN
2.0000 mg | Freq: Once | INTRAMUSCULAR | Status: AC
  Administered 2019-07-08: 22:00:00 2 mg via INTRAVENOUS

## 2019-07-08 MED ORDER — SULFAMETHOXAZOLE-TRIMETHOPRIM 800-160 MG PO TABS
1.0000 | ORAL_TABLET | Freq: Once | ORAL | Status: AC
  Administered 2019-07-09: 1 via ORAL
  Filled 2019-07-08: qty 1

## 2019-07-08 NOTE — ED Notes (Signed)
Pt;s blood sugar with ems was 490, pt reports that he just ate,

## 2019-07-08 NOTE — Progress Notes (Signed)
Telemetry removed, IV to be removed after Keppra infusion. Wife at bedside, d/c instructions reviewed. Verbalized understanding. Wife left to get clothes for d/c, to return and pick up patient. Patient to be transported to private vehicle via wheelchair when ready. Will continue to monitor.

## 2019-07-08 NOTE — ED Triage Notes (Addendum)
Pt arrived to er by ems after pt;s wife noticed that pt was having seizure type activity, pt states that he is not sure what happened, is able to remember 'shaking", denies any complaints at present, pt was just discharged from the hospital for same complaint, pt alert, oriented, able to answer questions,

## 2019-07-08 NOTE — ED Notes (Signed)
Pt started jerking arms and legs at 2209 EDP witnessed and asked this nurse to override 2mg  of Ativan out of the medicine cabinet.

## 2019-07-08 NOTE — Discharge Summary (Signed)
Physician Discharge Summary  Jeffery [name] XBD:[id number] DOB: 1951-06-12 DOA: 07/05/2019  PCP: Jeffery Noble, MD  Admit date: 07/05/2019 Discharge date: 07/08/2019  Time spent: 35 minutes  Recommendations for Outpatient Follow-up:  1. Repeat basic metabolic panel to follow electrolytes and renal function 2. Close follow-up the patient's CBG and A1c with further adjustment to hypoglycemic regimen as needed 3. Continue assisting with alcohol cessation  Discharge Diagnoses:  Active Problems:   Seizures ([place])   Alzheimer's dementia with behavioral disturbance ([name])   Essential hypertension   Hyperlipidemia   Type 2 diabetes mellitus with hyperglycemia, with long-term current use of insulin ([name])   Alcohol abuse   Discharge Condition: Stable and improved.  No seizure activity or active withdrawal symptoms appreciated at time of discharge.  Patient instructed to follow-up with PCP in 10 days and with cardiologist (Dr. Merlene [name]) in 3 weeks.  Patient's wife expressed that she will follow-up with Dr. Merlene [name] as he is closer to their house and to further receive instructions for seizure activity treatment.  Diet recommendation: Heart healthy and modified carbohydrate diet.  Filed Weights   07/06/19 0830  Weight: 91.4 kg    History of present illness:  68 y.o.malewith medical history significant forprostate cancer, diabetes, hypertension, who was brought to the ED via EMS with reports of altered mental status and left-sided weakness.History is obtained from spouse who is at bedside, as patient cannot remember the events of what happened.At the time of my evaluation patient is somewhat drowsy but responding tomostquestions appropriately and following most directions. At about 2:30 PM this afternoon, spouse had given patient lunch to eat, when she heard a noise,when she arrived patient had dropped his food on the floor, knocked over the table andthecontents in it. She called patient's name  but patient was not responding. His eyes were closed, there was a slight droop to the right side of his face (triageintake notes reports left side)and what looked like a little bit of foamy sputumpresent at the side of his mouth. She did not witness any jerking activity,she is unsure if there was jerking activity before shegot there.She denies a history of seizures in the patient. No fever no chills, no difficulty breathing no cough, no complaints of pain. No vomiting no loose stools, unchanged oral intake. Patient intermittently self caths, as he occasionally has blood clots in his urine. He has a history of memory loss, but at baseline patient is independent of all his ADLs, can hold a conversation, for the most part recognizes family, ambulates without assistance.  Hospital Course:  1-seizure-like activity -Patient with history of alcohol abuse -New onset seizure activity -Per wife has not stopped drinking and in fact the day of admission was actually drinking wine -EEG unrevealing. -CT scan and MRI of the head negative for acute abnormalities -Following neurology recommendation will continue the use of Keppra 1000 mg twice a day. -Outpatient follow-up with neurology service in 3 weeks. -CK level and prolactin level negative.  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  3-alcohol abuse -Cessation counseling has been provided -Continue thiamine and folic acid -No withdrawal symptoms appreciated -Will also provide B12 supplementation on daily basis.  4-hyperlipidemia -Continue statins.  5-history of dementia -Continue Aricept and Namenda -Continue supportive care -At this moment we will not recommend patient to be  left alone unattended.  6-type 2  diabetes with hyperglycemia -A1c 11.7 demonstrating poor control on his diabetes -Continue Levemir and glimepiride. -Modified carbohydrate diet has been advised. -Will benefit of close follow-up with his PCP to further adjust hypoglycemic regimen as needed based on CBGs/A1c fluctuation.   -Patient advised to maintain adequate hydration.   -Family reported medication noncompliance.  Procedures:  See below for x-ray reports  EEG: No acute epileptic waves abnormalities appreciated.  Consultations:  Neurology  Discharge Exam: Vitals:   07/08/19 0017 07/08/19 0611  BP: 111/67 108/60  Pulse: 67 60  Resp: 20 19  Temp: 97.8 F (36.6 C) (!) 97.4 F (36.3 C)  SpO2: 100% 99%    General: Afebrile, oriented x2, no fever, no chest pain, no nausea, no vomiting, no shortness of breath.  No further seizure-like activity appreciated.  No acute withdrawal symptoms. Cardiovascular: S1 and S2, no rubs, no gallops, no JVD. Respiratory: Good air movement bilaterally.  No using accessory muscle.  Good oxygen saturation on room air. Abdomen: Soft, nontender, nondistended, positive bowel sounds Extremities: No edema, no cyanosis, no clubbing.  Discharge Instructions   Discharge Instructions    Diet - low sodium heart healthy   Complete by: As directed    Discharge instructions   Complete by: As directed    Stop alcohol consumption Take medications as prescribed Maintain adequate hydration Follow heart healthy diet Arrange follow-up with neurologist (Dr. Merlene [name]) in 3 weeks. Follow-up with PCP in 10 days.     Allergies as of 07/08/2019      Reactions   Penicillins Hives, Nausea And Vomiting, Swelling   Swelling of lips Has patient had a PCN reaction causing immediate rash, facial/tongue/throat swelling, SOB or lightheadedness with hypotension: Yes Has patient had a PCN reaction causing severe rash involving mucus membranes or skin necrosis: No Has patient had a PCN reaction that  required hospitalization No Has patient had a PCN reaction occurring within the last 10 years: No If all of the above answers are "NO", then may proceed with Cephalosporin use.   Amlodipine Swelling   Caused swelling in feet and legs   Atenolol Swelling, Other (See Comments)   Angioedema   Clonidine Derivatives Swelling, Other (See Comments)   Angioedema   Metformin And Related    Irritable   Tape Rash   No plastic, "patterned" tape!!      Medication List    TAKE these medications   aspirin 81 MG EC tablet Take 1 tablet (81 mg total) by mouth daily. Start taking on: July 09, 2019   cyanocobalamin 1000 MCG tablet Take 1 tablet (1,000 mcg total) by mouth daily. Start taking on: July 09, 2019   donepezil 10 MG tablet Commonly known as: ARICEPT Take one tablet at bedtime.  Please call [phone] to schedule an appt.   glimepiride 2 MG tablet Commonly known as: AMARYL Take 2 mg by mouth every morning.   Klor-Con M20 20 MEQ tablet Generic drug: potassium chloride SA Take 20 mEq by mouth daily.   Levemir FlexTouch 100 UNIT/ML Pen Generic drug: Insulin Detemir Inject 20-30 Units into the skin every morning.   levETIRAcetam 1000 MG tablet Commonly known as: Keppra Take 1 tablet (1,000 mg total) by mouth 2 (two) times daily.   memantine 10 MG tablet Commonly known as: NAMENDA Take 1 tablet (10 mg total) by mouth 2 (two) times daily. Please call [phone] to schedule an appt.   multivitamin with minerals Tabs tablet Take 1 tablet by  mouth daily. Start taking on: July 09, 2019   simvastatin 20 MG tablet Commonly known as: ZOCOR Take 20 mg by mouth at bedtime.      Allergies  Allergen Reactions  . Penicillins Hives, Nausea And Vomiting and Swelling    Swelling of lips Has patient had a PCN reaction causing immediate rash, facial/tongue/throat swelling, SOB or lightheadedness with hypotension: Yes Has patient had a PCN reaction causing severe rash involving mucus  membranes or skin necrosis: No Has patient had a PCN reaction that required hospitalization No Has patient had a PCN reaction occurring within the last 10 years: No If all of the above answers are "NO", then may proceed with Cephalosporin use.   . Amlodipine Swelling    Caused swelling in feet and legs  . Atenolol Swelling and Other (See Comments)    Angioedema   . Clonidine Derivatives Swelling and Other (See Comments)    Angioedema   . Metformin And Related     Irritable   . Tape Rash    No plastic, "patterned" tape!!   Follow-up Information    Jeffery Noble, MD. Schedule an appointment as soon as possible for a visit in 10 day(s).   Specialty: Internal Medicine Contact information: [address] [place] [address] [phone]        Phillips Odor, MD. Schedule an appointment as soon as possible for a visit in 3 week(s).   Specialty: Neurology Contact information: [address] [place] [address] [phone]           The results of significant diagnostics from this hospitalization (including imaging, microbiology, ancillary and laboratory) are listed below for reference.    Significant Diagnostic Studies: Mr Brain Wo Contrast  Result Date: 07/05/2019 CLINICAL DATA:  Altered mental status. Left-sided weakness, facial droop, and vision loss. EXAM: MRI HEAD WITHOUT CONTRAST TECHNIQUE: Multiplanar, multiecho pulse sequences of the brain and surrounding structures were obtained without intravenous contrast. COMPARISON:  Head CT 07/05/2019 and MRI 01/02/2018 FINDINGS: The examination had to be discontinued prior to completion due to the patient's inability to tolerate the examination including attempts to climb out of the scanner. Axial and coronal diffusion, sagittal T1, axial T2*, and axial T2 sequences were obtained. The study is motion degraded, including severe motion on the axial T2 sequence. Brain: Diffusion imaging is of diagnostic quality, and  there is no evidence of acute infarct. No intracranial hemorrhage, intracranial mass effect, or extra-axial fluid collection is identified. Mildly age advanced cerebral atrophy is noted. Assessment of chronic cerebral white matter disease is limited due to motion and incomplete imaging. Vascular: Poorly evaluated due to motion. Skull and upper cervical spine: No suspicious marrow lesion. Sinuses/Orbits: Bilateral cataract extraction. No evidence of significant acute inflammatory sinus disease. Other: None. IMPRESSION: Motion degraded, incomplete examination without evidence of acute abnormality. Electronically Signed   By: Logan Bores M.D.   On: 07/05/2019 16:30   Mr Jeri Cos MV Contrast  Result Date: 07/06/2019 CLINICAL DATA:  New onset seizure with transient left-sided weakness. History of prostate cancer. EXAM: MRI HEAD WITHOUT AND WITH CONTRAST TECHNIQUE: Multiplanar, multiecho pulse sequences of the brain and surrounding structures were obtained without and with intravenous contrast. CONTRAST:  10 mL Gadavist COMPARISON:  Incomplete noncontrast brain MRI 07/05/2019 FINDINGS: The study is mildly motion degraded. Brain: There is no evidence of acute infarct, intracranial hemorrhage, mass, midline shift, or extra-axial fluid collection. Moderate generalized cerebral atrophy is advanced for age. Periventricular white matter T2 hyperintensities are  nonspecific but compatible with mild chronic small vessel ischemic disease. The hippocampi are symmetric in size and signal. No abnormal enhancement is identified. Vascular: Major intracranial vascular flow voids are preserved. Skull and upper cervical spine: No suspicious marrow lesion. Sinuses/Orbits: Bilateral cataract extraction. Small left maxillary sinus mucous retention cyst. Clear mastoid air cells. Other: None. IMPRESSION: 1. No acute intracranial abnormality or mass. 2. Mild chronic small vessel ischemic disease and moderate cerebral atrophy. Electronically  Signed   By: Logan Bores M.D.   On: 07/06/2019 08:08   US Carotid Bilateral  Result Date: 07/06/2019 CLINICAL DATA:  Altered mental status, hypertension weakness EXAM: BILATERAL CAROTID DUPLEX ULTRASOUND TECHNIQUE: Pearline Cables scale imaging, color Doppler and duplex ultrasound were performed of bilateral carotid and vertebral arteries in the neck. COMPARISON:  None. FINDINGS: Criteria: Quantification of carotid stenosis is based on velocity parameters that correlate the residual internal carotid diameter with NASCET-based stenosis levels, using the diameter of the distal internal carotid lumen as the denominator for stenosis measurement. The following velocity measurements were obtained: RIGHT ICA: 96/27 cm/sec CCA: 36/1 cm/sec SYSTOLIC ICA/CCA RATIO:  1.2 ECA: 64 cm/sec LEFT ICA: 86/20 cm/sec CCA: 44/3 cm/sec SYSTOLIC ICA/CCA RATIO:  0.9 ECA: 66 cm/sec RIGHT CAROTID ARTERY: Minor echogenic shadowing plaque formation. No hemodynamically significant right ICA stenosis, velocity elevation, or turbulent flow. Degree of narrowing less than 50%. RIGHT VERTEBRAL ARTERY:  Antegrade LEFT CAROTID ARTERY: Similar scattered minor echogenic plaque formation. No hemodynamically significant left ICA stenosis, velocity elevation, or turbulent flow. LEFT VERTEBRAL ARTERY:  Antegrade IMPRESSION: Minor carotid atherosclerosis. No hemodynamically significant ICA stenosis by ultrasound. Degree of narrowing less than 50% bilaterally. Patent antegrade vertebral flow bilaterally Electronically Signed   By: Jerilynn Mages.  Shick M.D.   On: 07/06/2019 13:05   Ct Head Code Stroke Wo Contrast  Result Date: 07/05/2019 CLINICAL DATA:  Code stroke. Altered level of consciousness. Seizure like activity. EXAM: CT HEAD WITHOUT CONTRAST TECHNIQUE: Contiguous axial images were obtained from the base of the skull through the vertex without intravenous contrast. COMPARISON:  Head MRI 01/02/2018 and CT 02/15/2017 FINDINGS: The study is mildly motion degraded.  Brain: There is no evidence of acute infarct, intracranial hemorrhage, mass, midline shift, or extra-axial fluid collection. There is mildly age advanced cerebral atrophy. Cerebral white matter hypodensities are nonspecific but compatible with mild chronic small vessel ischemic disease, stable to mildly progressed from the prior CT. Vascular: Calcified atherosclerosis at the skull base. No hyperdense vessel. Skull: No fracture or focal osseous lesion is identified although a portion of the left frontal skull was excluded. Sinuses/Orbits: Visualized paranasal sinuses and mastoid air cells are clear. Bilateral cataract extraction is noted. Other: None. ASPECTS [facility] Stroke Program Early CT Score) Not scored with this history. IMPRESSION: 1. No evidence of acute intracranial abnormality. 2. Mild chronic small vessel ischemic disease and cerebral atrophy. These results were called by telephone at the time of interpretation on 07/05/2019 at 3:39 pm to Dr. Milton Ferguson , who verbally acknowledged these results. Electronically Signed   By: Logan Bores M.D.   On: 07/05/2019 15:39    Microbiology: Recent Results (from the past 240 hour(s))  SARS Coronavirus 2 [facility] order, Performed in [facility] hospital lab) Nasopharyngeal Nasopharyngeal Swab     Status: None   Collection Time: 07/05/19  4:18 PM   Specimen: Nasopharyngeal Swab  Result Value Ref Range Status   SARS Coronavirus 2 NEGATIVE NEGATIVE Final    Comment: (NOTE) If result is NEGATIVE SARS-CoV-2 target nucleic acids are  NOT DETECTED. The SARS-CoV-2 RNA is generally detectable in upper and lower  respiratory specimens during the acute phase of infection. The lowest  concentration of SARS-CoV-2 viral copies this assay can detect is 250  copies / mL. A negative result does not preclude SARS-CoV-2 infection  and should not be used as the sole basis for treatment or other  patient management decisions.  A negative result may occur with  improper  specimen collection / handling, submission of specimen other  than nasopharyngeal swab, presence of viral mutation(s) within the  areas targeted by this assay, and inadequate number of viral copies  (<250 copies / mL). A negative result must be combined with clinical  observations, patient history, and epidemiological information. If result is POSITIVE SARS-CoV-2 target nucleic acids are DETECTED. The SARS-CoV-2 RNA is generally detectable in upper and lower  respiratory specimens dur ing the acute phase of infection.  Positive  results are indicative of active infection with SARS-CoV-2.  Clinical  correlation with patient history and other diagnostic information is  necessary to determine patient infection status.  Positive results do  not rule out bacterial infection or co-infection with other viruses. If result is PRESUMPTIVE POSTIVE SARS-CoV-2 nucleic acids MAY BE PRESENT.   A presumptive positive result was obtained on the submitted specimen  and confirmed on repeat testing.  While 2019 novel coronavirus  (SARS-CoV-2) nucleic acids may be present in the submitted sample  additional confirmatory testing may be necessary for epidemiological  and / or clinical management purposes  to differentiate between  SARS-CoV-2 and other Sarbecovirus currently known to infect humans.  If clinically indicated additional testing with an alternate test  methodology [phone]) is advised. The SARS-CoV-2 RNA is generally  detectable in upper and lower respiratory sp ecimens during the acute  phase of infection. The expected result is Negative. Fact Sheet for Patients:  StrictlyIdeas.no Fact Sheet for Healthcare Providers: BankingDealers.co.za This test is not yet approved or cleared by the [place] FDA and has been authorized for detection and/or diagnosis of SARS-CoV-2 by FDA under an Emergency Use Authorization (EUA).  This EUA will remain in  effect (meaning this test can be used) for the duration of the COVID-19 declaration under Section 564(b)(1) of the Act, 21 U.S.C. section 360bbb-3(b)(1), unless the authorization is terminated or revoked sooner. Performed at [facility], [address], [place], [place] [address]      Labs: Basic Metabolic Panel: Recent Labs  Lab 07/05/19 1538 07/06/19 0426  NA 134* 137  K 4.1 4.0  CL 104 106  CO2 19* 23  GLUCOSE 371* 312*  BUN 23 23  CREATININE 1.52* 1.41*  CALCIUM 9.0 9.1  MG 2.0  --   PHOS 4.0  --    Liver Function Tests: Recent Labs  Lab 07/05/19 1538  AST 19  ALT 13  ALKPHOS 97  BILITOT 1.0  PROT 7.8  ALBUMIN 3.4*   CBC: Recent Labs  Lab 07/05/19 1538  WBC 5.2  NEUTROABS 3.0  HGB 10.6*  HCT 35.1*  MCV 90.0  PLT 223   Cardiac Enzymes: Recent Labs  Lab 07/06/19 0426  CKTOTAL 118   CBG: Recent Labs  Lab 07/07/19 1101 07/07/19 1637 07/07/19 2149 07/08/19 0803 07/08/19 1110  GLUCAP 364* 172* 246* 304* 360*    Signed:  Barton Dubois MD.  [facility] 07/08/2019, 2:05 PM

## 2019-07-09 LAB — HOMOCYSTEINE: Homocysteine: 26.4 umol/L — ABNORMAL HIGH (ref 0.0–17.2)

## 2019-07-09 MED ORDER — LEVETIRACETAM IN NACL 1500 MG/100ML IV SOLN: 1500.0000 mg | Freq: Once | INTRAVENOUS | Status: DC

## 2019-07-09 MED ORDER — LEVETIRACETAM ER 500 MG PO TB24: 1000.0000 mg | ORAL_TABLET | Freq: Two times a day (BID) | ORAL | Status: DC

## 2019-07-09 MED ORDER — SULFAMETHOXAZOLE-TRIMETHOPRIM 800-160 MG PO TABS: 1.0000 | ORAL_TABLET | Freq: Two times a day (BID) | ORAL | 0 refills | Status: DC

## 2019-07-09 MED ORDER — LEVETIRACETAM IN NACL 1000 MG/100ML IV SOLN
1000.0000 mg | Freq: Once | INTRAVENOUS | Status: AC
  Administered 2019-07-09: 1000 mg via INTRAVENOUS
  Filled 2019-07-09: qty 100

## 2019-07-09 NOTE — ED Notes (Signed)
Per pharmacy pt rcvd 1000mg  IV Keppra on 07/06/2019, 07/07/2019 and 07/08/2019, last dose was on 07/08/2019 at 1517

## 2019-07-09 NOTE — ED Notes (Signed)
Neuro cart placed in room and button pressed for tele-neuro consult

## 2019-07-09 NOTE — ED Provider Notes (Signed)
[facility] EMERGENCY DEPARTMENT Provider Note   CSN: [id number] Arrival date & time: 07/08/19  2116  Time seen 11:45 PM (patient was using the bathroom at 11:30 PM)  History   Chief Complaint Chief Complaint  Patient presents with  . Seizures    HPI Jeffery [name] is a 68 y.o. male.     HPI history is was obtained from patient and his wife.  Patient was admitted to the hospital on August 6 when he had new onset of seizure activity with Todd's paralysis on the left.  He was evaluated by [name], neurologist.  When nursing talk to pharmacy he was getting Keppra 1000 mg IV once a day, last given around 4 PM today.  Wife reports about 9 PM they were at home and he was talking on the phone and started having trembling in his right arm and then it was in both arms and both legs with some twitching of his head.  Patient said "on twitching and I cannot stop" and then he dropped the phone.  She states it lasted a few minutes and it had stopped by the time EMS got there.  He was sitting down in a recliner and did not fall.  She states he was very confused afterwards.  Wife states this is only the third episode she has seen although she was told he had a couple while hospitalized.  Patient states he would drink about 6 beers a day and sometimes a bottle wine.  His last alcohol was the day that he came to the ED to be admitted.  Patient did have an episode in the ED and got Ativan 2 mg IV before I saw the patient.  PCP [name], MD Neurology Dr Merlene Laughter  Past Medical History:  Diagnosis Date  . Cancer [facility])    prostate  . Cervical spondylosis   . Glaucoma   . Hematuria    radiation cystitis  . History of migraine headaches   . Hx of arthroscopic knee surgery    Left knee  . Hyperlipidemia   . Hypertension   . Impaired fasting glucose   . Memory loss   . Progressive gait disorder   . Renal disorder    kidney stone  . Varicose veins     Patient Active Problem List   Diagnosis  Date Noted  . Alzheimer's dementia with behavioral disturbance ([place])   . Essential hypertension   . Hyperlipidemia   . Type 2 diabetes mellitus with hyperglycemia, with long-term current use of insulin ([place])   . Alcohol abuse   . Seizures ([place]) 07/05/2019  . Memory disorder 12/13/2017  . AKI (acute kidney injury) ([place]) 02/16/2017  . Hyponatremia 02/16/2017  . Hypokalemia 02/15/2017  . Syncope and collapse 12/07/2012  . Hyperthyroidism 12/07/2012  . Other malaise and fatigue 12/07/2012  . Disturbance of skin sensation 12/07/2012  . Cervical spondylosis without myelopathy 12/07/2012  . Abnormality of gait 12/07/2012  . Cerebellar ataxia in diseases classified elsewhere ([place]) 12/07/2012    Past Surgical History:  Procedure Laterality Date  . CHOLECYSTECTOMY    . EYE SURGERY    . PROSTATE SURGERY          Home Medications    Prior to Admission medications   Medication Sig Start Date End Date Taking? Authorizing Provider  aspirin EC 81 MG EC tablet Take 1 tablet (81 mg total) by mouth daily. 07/09/19   Barton Dubois, MD  donepezil (ARICEPT) 10 MG tablet Take one  tablet at bedtime.  Please call [phone] to schedule an appt. 06/19/19   Suzzanne Cloud, NP  glimepiride (AMARYL) 2 MG tablet Take 2 mg by mouth every morning. 04/16/19   [provider]  Insulin Detemir (LEVEMIR FLEXTOUCH) 100 UNIT/ML Pen Inject 20-30 Units into the skin every morning.     [provider]  KLOR-CON M20 20 MEQ tablet Take 20 mEq by mouth daily.  02/18/19   [provider]  levETIRAcetam (KEPPRA) 1000 MG tablet Take 1 tablet (1,000 mg total) by mouth 2 (two) times daily. 07/08/19   Barton Dubois, MD  memantine (NAMENDA) 10 MG tablet Take 1 tablet (10 mg total) by mouth 2 (two) times daily. Please call [phone] to schedule an appt. 06/19/19   Suzzanne Cloud, NP  Multiple Vitamin (MULTIVITAMIN WITH MINERALS) TABS tablet Take 1 tablet by mouth daily. 07/09/19   Barton Dubois, MD   simvastatin (ZOCOR) 20 MG tablet Take 20 mg by mouth at bedtime.    [provider]  sulfamethoxazole-trimethoprim (BACTRIM DS) 800-160 MG tablet Take 1 tablet by mouth 2 (two) times daily. 07/09/19   Rolland Porter, MD  vitamin B-12 1000 MCG tablet Take 1 tablet (1,000 mcg total) by mouth daily. 07/09/19   Barton Dubois, MD    Family History Family History  Problem Relation Age of Onset  . Glaucoma Mother   . Arthritis Father   . Diabetes Father   . Fibromyalgia Sister   . Gait disorder Maternal Uncle        progessive gait disorder    Social History Social History   Tobacco Use  . Smoking status: Former Research scientist (life sciences)  . Smokeless tobacco: Never Used  Substance Use Topics  . Alcohol use: Yes    Comment: 0.5 pint of wine daily  . Drug use: No  lives at home  Lives with spouse   Allergies   Penicillins, Amlodipine, Atenolol, Clonidine derivatives, Metformin and related, and Tape   Review of Systems Review of Systems   Physical Exam Updated Vital Signs BP 134/68 (BP Location: Left Arm)   Pulse 81   Temp 98.4 F (36.9 C) (Oral)   Resp 18   SpO2 97%   Vital signs normal    Physical Exam Vitals signs and nursing note reviewed.  Constitutional:      General: He is not in acute distress.    Appearance: Normal appearance. He is well-developed. He is not ill-appearing or toxic-appearing.  HENT:     Head: Normocephalic and atraumatic.     Right Ear: External ear normal.     Left Ear: External ear normal.     Nose: Nose normal. No mucosal edema or rhinorrhea.     Mouth/Throat:     Mouth: Mucous membranes are dry.     Dentition: No dental abscesses.     Pharynx: No uvula swelling.  Eyes:     Extraocular Movements: Extraocular movements intact.     Conjunctiva/sclera: Conjunctivae normal.     Pupils: Pupils are equal, round, and reactive to light.  Neck:     Musculoskeletal: Full passive range of motion without pain, normal range of motion and neck supple.   Cardiovascular:     Rate and Rhythm: Normal rate and regular rhythm.     Heart sounds: Normal heart sounds. No murmur. No friction rub. No gallop.   Pulmonary:     Effort: Pulmonary effort is normal. No respiratory distress.     Breath sounds: Normal breath sounds. No  wheezing, rhonchi or rales.  Chest:     Chest wall: No tenderness or crepitus.  Abdominal:     General: Bowel sounds are normal. There is no distension.     Palpations: Abdomen is soft.     Tenderness: There is no abdominal tenderness. There is no guarding or rebound.  Musculoskeletal: Normal range of motion.        General: No tenderness.     Comments: Moves all extremities well.   Skin:    General: Skin is warm and dry.     Coloration: Skin is not pale.     Findings: No erythema or rash.  Neurological:     General: No focal deficit present.     Mental Status: He is alert and oriented to person, place, and time.     Cranial Nerves: No cranial nerve deficit.     Comments: Patient has mild pronator drift on the left, he seems to be a little bit weak on the left with straight leg raising but he was able to do it.  His grips were mildly weak on the left.  Psychiatric:        Mood and Affect: Mood normal. Mood is not anxious.        Speech: Speech normal.        Behavior: Behavior normal.        Thought Content: Thought content normal.      ED Treatments / Results  Labs (all labs ordered are listed, but only abnormal results are displayed) Results for orders placed or performed during the hospital encounter of 07/08/19  Urinalysis, Routine w reflex microscopic  Result Value Ref Range   Color, Urine PINK (A) YELLOW   APPearance CLEAR CLEAR   Specific Gravity, Urine 1.020 1.005 - 1.030   pH 6.0 5.0 - 8.0   Glucose, UA >=500 (A) NEGATIVE mg/dL   Hgb urine dipstick LARGE (A) NEGATIVE   Bilirubin Urine NEGATIVE NEGATIVE   Ketones, ur NEGATIVE NEGATIVE mg/dL   Protein, ur NEGATIVE NEGATIVE mg/dL   Nitrite NEGATIVE  NEGATIVE   Leukocytes,Ua NEGATIVE NEGATIVE   RBC / HPF >50 (H) 0 - 5 RBC/hpf   WBC, UA >50 (H) 0 - 5 WBC/hpf   Bacteria, UA NONE SEEN NONE SEEN   Mucus PRESENT    Laboratory interpretation all normal except possible UTI, urine culture sent   EKG None  Radiology No results found.   Mr Brain 83 Contrast Mr Jeri Cos IZ Contrast  Result Date: 07/06/2019 CLINICAL DATA:  New onset seizure with transient left-sided weakness. History of prostate cancer. . IMPRESSION: 1. No acute intracranial abnormality or mass. 2. Mild chronic small vessel ischemic disease and moderate cerebral atrophy. Electronically Signed   By: Logan Bores M.D.   On: 07/06/2019 08:08   US Carotid Bilateral  Result Date: 07/06/2019 CLINICAL DATA:  Altered mental status, hypertension weakness  IMPRESSION: Minor carotid atherosclerosis. No hemodynamically significant ICA stenosis by ultrasound. Degree of narrowing less than 50% bilaterally. Patent antegrade vertebral flow bilaterally Electronically Signed   By: Jerilynn Mages.  Shick M.D.   On: 07/06/2019 13:05   Ct Head Code Stroke Wo Contrast  Result Date: 07/05/2019 CLINICAL DATA:  Code stroke. Altered level of consciousness. Seizure like activity. . IMPRESSION: 1. No evidence of acute intracranial abnormality. 2. Mild chronic small vessel ischemic disease and cerebral atrophy. These results were called by telephone at the time of interpretation on 07/05/2019 at 3:39 pm to Dr. Milton Ferguson , who verbally  acknowledged these results. Electronically Signed   By: Logan Bores M.D.   On: 07/05/2019 15:39   Patient Name: DIMITRI SHAKESPEARE  MRN: [id number]  Epilepsy Attending: Lora Havens  Referring Physician/Provider: Dr Jenetta Downer Date: 07/06/2019 Duration: 23.50 mins  Patient history: 68yo M with ams. EEG to evaluate for seizure   IMPRESSION: This study only during sleep is suggestive of mild to moderate diffuse encephalopathy. No seizures or epileptiform discharges were seen  throughout the recording.  Lora Havens    Echocardiogram 07/06/2019 IMPRESSIONS   1. The left ventricle has normal systolic function with an ejection fraction of 60-65%. The cavity size was normal. Left ventricular diastolic Doppler parameters are consistent with impaired relaxation.  2. The right ventricle has normal systolic function. The cavity was normal. There is no increase in right ventricular wall thickness.  3. The mitral valve is grossly normal.  4. The tricuspid valve is grossly normal.  5. The aortic valve is tricuspid. Mild thickening of the aortic valve. Mild calcification of the aortic valve. Aortic valve regurgitation is trivial by color flow Doppler.  6. The aorta is normal in size and structure.  7. The inferior vena cava was dilated in size with >50% respiratory variability.  Procedures Procedures (including critical care time)  Medications Ordered in ED Medications  levETIRAcetam (KEPPRA XR) 24 hr tablet 1,000 mg (has no administration in time range)  LORazepam (ATIVAN) injection 2 mg (2 mg Intravenous Given 07/08/19 2208)  sulfamethoxazole-trimethoprim (BACTRIM DS) 800-160 MG per tablet 1 tablet (1 tablet Oral Given 07/09/19 0028)  levETIRAcetam (KEPPRA) IVPB 1000 mg/100 mL premix (0 mg Intravenous Stopped 07/09/19 0121)     Initial Impression / Assessment and Plan / ED Course  I have reviewed the triage vital signs and the nursing notes.  Pertinent labs & imaging results that were available during my care of the patient were reviewed by me and considered in my medical decision making (see chart for details).    When I review patient's test results his urine today now has over 50 white blood cells per high-power field and on August 6 it was 0-5.  He has had hematuria in most of his prior urinalysis and he states he is followed by a urologist already.  Patient was started on Septra DS.  He has a penicillin allergy.  I did not want to give him Cipro due to his age  and risk of tendon problems.  I had nursing staff check with pharmacy to see how he was getting his Keppra.  I am going to talk to the neurologist on-call to see if I should go ahead and increase his Keppra to thousand milligrams twice a day.  Pharmacy states patient got 1000 mg IV once a day for 3 days he was in the hospital.  When I review his discharge instructions he is supposed to be taking 1000 mg twice a day.  I am going to go ahead and give him a second dose in the ED.  Tele-neurology consult agrees with increasing his Keppra to 1000 mg twice a day.  Patient was discharged home with his wife.  Final Clinical Impressions(s) / ED Diagnoses   Final diagnoses:  Seizure disorder ([place])  Urinary tract infection with hematuria, site unspecified    ED Discharge Orders         Ordered    sulfamethoxazole-trimethoprim (BACTRIM DS) 800-160 MG tablet  2 times daily     07/09/19 0217  Plan discharge  Rolland Porter, MD, Barbette Or, MD 07/09/19 [phone]

## 2019-07-09 NOTE — ED Notes (Signed)
Per tele-neurologist requests after exam: 750 BID, EEG and sepsis work-up

## 2019-07-09 NOTE — Discharge Instructions (Addendum)
Take the antibiotics until gone.  Recheck if he gets a fever, vomiting, abdominal pain or gets more confused than usual.  Please continue his seizure medication twice a day.  Follow-up with [name] as discussed when you were discharged from the hospital.  He should not drive or do any activity that he could get her if he had a seizure.

## 2019-07-09 NOTE — Consult Note (Signed)
[facility] TeleNeurology Consult Services  Stat Consult  Date of Service:   07/09/2019 00:30:58  Impression:     .  Seizure  Comments/Sign-Out: The patient's dose of Keppra needs to be changed to 1000 mg twice a day for maintenance. Repeat EEG and sepsis workup is recommended.  CT HEAD: Showed No Acute Hemorrhage or Acute Core Infarct  Metrics: [facility] Notification Time: 07/09/2019 00:30:58 Stamp Time: 07/09/2019 00:30:58 Video Start Time: 07/09/2019 00:41:08 Video End Time: 07/09/2019 00:57:08  Our recommendations are outlined below.  Recommendations:     .  EEG     .  Sepsis workup     .  Keppra 1000 mg twice a day for maintenance instead of thousand once a day   Therapies:     .  Physical Therapy, Occupational Therapy, Speech Therapy Assessment When Applicable  Other WorkUp:     .  Infectious/metabolic workup per primary team  Disposition: Neurology Follow Up Recommended  Sign Out:     .  Discussed with Emergency Department Provider  ----------------------------------------------------------------------------------------------------  Chief Complaint: Recurrent seizures  History of Present Illness: Patient is a 68 year old Male.  68 year old male with past medical history significant for hypertension, diabetes and prostate cancer in the past. He was admitted to the hospital about three days ago with new onset seizures. He was started on Keppra and the maintenance dose was thousand milligrams once a day and he was discharged home in a stable condition. His brain MRI was unremarkable. He was discharged yesterday but came back today with recurrent seizures. The seizures come without any warning. He is a generalized tonic clonic seizure with occasional tongue bite. There is no incontinence of urine. He has had a total of four seizures today. He denies having any headaches, dizziness, blurred vision, focal weakness or numbness in extremities.His NIH stroke  scale is zero. I will change the dose as follows-discontinue Keppra thousand milligrams and start Keppra 1000 mg twice a day. We will repeat his EEG, sepsis workup. Neurology will follow up.  Examination: 1A: Level of Consciousness - Alert; keenly responsive + 0 1B: Ask Month and Age - Both Questions Right + 0 1C: Blink Eyes & Squeeze Hands - Performs Both Tasks + 0 2: Test Horizontal Extraocular Movements - Normal + 0 3: Test Visual Fields - No Visual Loss + 0 4: Test Facial Palsy (Use Grimace if Obtunded) - Normal symmetry + 0 5A: Test Left Arm Motor Drift - No Drift for 10 Seconds + 0 5B: Test Right Arm Motor Drift - No Drift for 10 Seconds + 0 6A: Test Left Leg Motor Drift - No Drift for 5 Seconds + 0 6B: Test Right Leg Motor Drift - No Drift for 5 Seconds + 0 7: Test Limb Ataxia (FNF/Heel-Shin) - No Ataxia + 0 8: Test Sensation - Normal; No sensory loss + 0 9: Test Language/Aphasia - Normal; No aphasia + 0 10: Test Dysarthria - Normal + 0 11: Test Extinction/Inattention - No abnormality + 0  NIHSS Score: 0   Due to the immediate potential for life-threatening deterioration due to underlying acute neurologic illness, I spent 35 minutes providing critical care. This time includes time for face to face visit via telemedicine, review of medical records, imaging studies and discussion of findings with providers, the patient and/or family.   Dr Lamount Cohen Marc Sivertsen   [facility] [phone]   Case [id number]

## 2019-07-10 LAB — URINE CULTURE: Special Requests: NORMAL

## 2019-08-13 ENCOUNTER — Ambulatory Visit: Payer: Medicare Other | Admitting: "Endocrinology

## 2019-08-13 ENCOUNTER — Other Ambulatory Visit: Payer: Self-pay

## 2019-08-13 ENCOUNTER — Encounter: Payer: Self-pay | Admitting: "Endocrinology

## 2019-08-13 ENCOUNTER — Encounter (INDEPENDENT_AMBULATORY_CARE_PROVIDER_SITE_OTHER): Payer: Self-pay

## 2019-08-13 VITALS — Temp 97.2°F | Ht 74.0 in | Wt 210.0 lb

## 2019-08-13 DIAGNOSIS — N183 Chronic kidney disease, stage 3 (moderate): Secondary | ICD-10-CM

## 2019-08-13 DIAGNOSIS — E1122 Type 2 diabetes mellitus with diabetic chronic kidney disease: Secondary | ICD-10-CM | POA: Diagnosis not present

## 2019-08-13 DIAGNOSIS — Z794 Long term (current) use of insulin: Secondary | ICD-10-CM

## 2019-08-13 NOTE — Progress Notes (Signed)
Endocrinology Consult Note       08/13/2019, 1:17 PM   Subjective:    Patient ID: Jeffery [name], male    DOB: 1951/09/25.  Jeffery [name] is being seen in consultation for management of currently uncontrolled symptomatic diabetes requested by  [name], MD.   Past Medical History:  Diagnosis Date  . Cancer [facility])    prostate  . Cervical spondylosis   . Diabetes mellitus, type II ([place])   . Glaucoma   . Hematuria    radiation cystitis  . History of migraine headaches   . Hx of arthroscopic knee surgery    Left knee  . Hyperlipidemia   . Hypertension   . Hypertension   . Hypertension   . Impaired fasting glucose   . Memory loss   . Progressive gait disorder   . Renal disorder    kidney stone  . Varicose veins     Past Surgical History:  Procedure Laterality Date  . CHOLECYSTECTOMY    . EYE SURGERY    . PROSTATE SURGERY      Social History   Socioeconomic History  . Marital status: Married    Spouse name: Not on file  . Number of children: 2  . Years of education: HS  . Highest education level: Not on file  Occupational History  . Occupation: Information systems manager  Social Needs  . Financial resource strain: Not on file  . Food insecurity    Worry: Not on file    Inability: Not on file  . Transportation needs    Medical: Not on file    Non-medical: Not on file  Tobacco Use  . Smoking status: Former Research scientist (life sciences)  . Smokeless tobacco: Never Used  Substance and Sexual Activity  . Alcohol use: Yes    Comment: 0.5 pint of wine daily  . Drug use: No  . Sexual activity: Not on file  Lifestyle  . Physical activity    Days per week: Not on file    Minutes per session: Not on file  . Stress: Not on file  Relationships  . Social Herbalist on phone: Not on file    Gets together: Not on file    Attends religious service: Not on file    Active member of club or  organization: Not on file    Attends meetings of clubs or organizations: Not on file    Relationship status: Not on file  Other Topics Concern  . Not on file  Social History Narrative   Lives at home with his wife.   Right-handed.   Occasional caffeine use.    Family History  Problem Relation Age of Onset  . Glaucoma Mother   . Arthritis Father   . Diabetes Father   . Fibromyalgia Sister   . Gait disorder Maternal Uncle        progessive gait disorder    Outpatient Encounter Medications as of 08/13/2019  Medication Sig  . divalproex (DEPAKOTE) 500 MG DR tablet Take 500 mg by mouth 1 day or 1 dose.  Marland Kitchen aspirin EC 81 MG EC  tablet Take 1 tablet (81 mg total) by mouth daily.  Marland Kitchen donepezil (ARICEPT) 10 MG tablet Take one tablet at bedtime.  Please call [phone] to schedule an appt.  Marland Kitchen glimepiride (AMARYL) 2 MG tablet Take 2 mg by mouth every morning.  . Insulin Detemir (LEVEMIR FLEXTOUCH) 100 UNIT/ML Pen Inject 40 Units into the skin at bedtime.  Marland Kitchen KLOR-CON M20 20 MEQ tablet Take 20 mEq by mouth daily.   Marland Kitchen levETIRAcetam (KEPPRA) 1000 MG tablet Take 1 tablet (1,000 mg total) by mouth 2 (two) times daily.  . memantine (NAMENDA) 10 MG tablet Take 1 tablet (10 mg total) by mouth 2 (two) times daily. Please call [phone] to schedule an appt.  . Multiple Vitamin (MULTIVITAMIN WITH MINERALS) TABS tablet Take 1 tablet by mouth daily.  . simvastatin (ZOCOR) 20 MG tablet Take 20 mg by mouth at bedtime.  . sulfamethoxazole-trimethoprim (BACTRIM DS) 800-160 MG tablet Take 1 tablet by mouth 2 (two) times daily.  . vitamin B-12 1000 MCG tablet Take 1 tablet (1,000 mcg total) by mouth daily.   No facility-administered encounter medications on file as of 08/13/2019.     ALLERGIES: Allergies  Allergen Reactions  . Penicillins Hives, Nausea And Vomiting and Swelling    Swelling of lips Has patient had a PCN reaction causing immediate rash, facial/tongue/throat swelling, SOB or lightheadedness with  hypotension: Yes Has patient had a PCN reaction causing severe rash involving mucus membranes or skin necrosis: No Has patient had a PCN reaction that required hospitalization No Has patient had a PCN reaction occurring within the last 10 years: No If all of the above answers are "NO", then may proceed with Cephalosporin use.   . Amlodipine Swelling    Caused swelling in feet and legs  . Atenolol Swelling and Other (See Comments)    Angioedema   . Clonidine Derivatives Swelling and Other (See Comments)    Angioedema   . Metformin And Related     Irritable   . Tape Rash    No plastic, "patterned" tape!!    VACCINATION STATUS:  There is no immunization history on file for this patient.  Diabetes He presents for his initial diabetic visit. He has type 2 diabetes mellitus. Onset time: He was diagnosed at approximate age of 3 years. His disease course has been worsening. There are no hypoglycemic associated symptoms. Pertinent negatives for hypoglycemia include no confusion, headaches, pallor or seizures. Associated symptoms include blurred vision, polydipsia and polyuria. Pertinent negatives for diabetes include no chest pain, no fatigue, no polyphagia and no weakness. There are no hypoglycemic complications. Symptoms are worsening. Diabetic complications include nephropathy. Risk factors for coronary artery disease include diabetes mellitus, dyslipidemia, hypertension, male sex, sedentary lifestyle and tobacco exposure. Current diabetic treatments: He is taking Levemir 30 units every morning, glimepiride 2 mg p.o. daily at breakfast. His weight is increasing steadily. He is following a generally unhealthy diet. When asked about meal planning, he reported none. He has not had a previous visit with a dietitian. He rarely participates in exercise. (He did not bring any logs nor meter to review.  His most recent A1c was 11.5%, prior measurement was 12.8%.)  Hyperlipidemia This is a chronic  problem. The current episode started more than 1 year ago. The problem is controlled. Exacerbating diseases include chronic renal disease and diabetes. Pertinent negatives include no chest pain, myalgias or shortness of breath. Current antihyperlipidemic treatment includes statins. Risk factors for coronary artery disease include dyslipidemia, diabetes mellitus, hypertension, male  sex and a sedentary lifestyle.  Hypertension This is a chronic problem. The current episode started more than 1 year ago. The problem is controlled. Associated symptoms include blurred vision. Pertinent negatives include no chest pain, headaches, neck pain, palpitations or shortness of breath. Risk factors for coronary artery disease include diabetes mellitus, dyslipidemia, male gender, sedentary lifestyle and smoking/tobacco exposure. Hypertensive end-organ damage includes kidney disease. Identifiable causes of hypertension include chronic renal disease.     Review of Systems  Constitutional: Negative for chills, fatigue, fever and unexpected weight change.  HENT: Negative for dental problem, mouth sores and trouble swallowing.   Eyes: Positive for blurred vision. Negative for visual disturbance.  Respiratory: Negative for cough, choking, chest tightness, shortness of breath and wheezing.   Cardiovascular: Negative for chest pain, palpitations and leg swelling.  Gastrointestinal: Negative for abdominal distention, abdominal pain, constipation, diarrhea, nausea and vomiting.  Endocrine: Positive for polydipsia and polyuria. Negative for polyphagia.  Genitourinary: Negative for dysuria, flank pain, hematuria and urgency.  Musculoskeletal: Negative for back pain, gait problem, myalgias and neck pain.  Skin: Negative for pallor, rash and wound.  Neurological: Negative for seizures, syncope, weakness, numbness and headaches.  Psychiatric/Behavioral: Negative for confusion and dysphoric mood.    Objective:    Temp (!)  97.2 F (36.2 C) (Oral)   Ht 6\' 2"  (1.88 m)   Wt 210 lb (95.3 kg)   SpO2 99%   BMI 26.96 kg/m   Wt Readings from Last 3 Encounters:  08/13/19 210 lb (95.3 kg)  07/06/19 201 lb 8 oz (91.4 kg)  12/04/18 200 lb (90.7 kg)     Physical Exam Constitutional:      General: He is not in acute distress.    Appearance: He is well-developed.  HENT:     Head: Normocephalic and atraumatic.  Neck:     Musculoskeletal: Normal range of motion and neck supple.     Thyroid: No thyromegaly.     Trachea: No tracheal deviation.  Cardiovascular:     Rate and Rhythm: Normal rate.     Pulses:          Dorsalis pedis pulses are 1+ on the right side and 1+ on the left side.       Posterior tibial pulses are 1+ on the right side and 1+ on the left side.     Heart sounds: S1 normal and S2 normal. No murmur. No gallop.   Pulmonary:     Effort: Pulmonary effort is normal. No respiratory distress.     Breath sounds: No wheezing.  Abdominal:     General: There is no distension.     Tenderness: There is no abdominal tenderness. There is no guarding.  Musculoskeletal:     Right shoulder: He exhibits no swelling and no deformity.  Skin:    General: Skin is warm and dry.     Findings: No rash.     Nails: There is no clubbing.   Neurological:     Mental Status: He is alert and oriented to person, place, and time.     Cranial Nerves: No cranial nerve deficit.     Sensory: No sensory deficit.     Gait: Gait normal.     Deep Tendon Reflexes: Reflexes are normal and symmetric.  Psychiatric:        Speech: Speech normal.        Behavior: Behavior normal. Behavior is cooperative.        Thought Content: Thought content normal.  Judgment: Judgment normal.       CMP ( most recent) CMP     Component Value Date/Time   NA 137 07/06/2019 0426   K 4.0 07/06/2019 0426   CL 106 07/06/2019 0426   CO2 23 07/06/2019 0426   GLUCOSE 312 (H) 07/06/2019 0426   BUN 23 07/06/2019 0426   CREATININE 1.41  (H) 07/06/2019 0426   CALCIUM 9.1 07/06/2019 0426   PROT 7.8 07/05/2019 1538   ALBUMIN 3.4 (L) 07/05/2019 1538   AST 19 07/05/2019 1538   ALT 13 07/05/2019 1538   ALKPHOS 97 07/05/2019 1538   BILITOT 1.0 07/05/2019 1538   GFRNONAA 51 (L) 07/06/2019 0426   GFRAA 59 (L) 07/06/2019 0426     Diabetic Labs (most recent): Lab Results  Component Value Date   HGBA1C 11.5 (H) 07/05/2019   HGBA1C 12.8 (H) 02/16/2017     Lipid Panel ( most recent) Lipid Panel     Component Value Date/Time   CHOL 168 07/06/2019 0426   TRIG 63 07/06/2019 0426   HDL 56 07/06/2019 0426   CHOLHDL 3.0 07/06/2019 0426   VLDL 13 07/06/2019 0426   LDLCALC 99 07/06/2019 0426      Lab Results  Component Value Date   TSH 2.706 07/07/2019     Assessment & Plan:   1. Type 2 diabetes mellitus with stage 3 chronic kidney disease, with long-term current use of insulin (HCC)  - Jeffery [name] has currently uncontrolled symptomatic type 2 DM since  68 years of age,  with most recent A1c of 11.5 %. Recent labs reviewed. - I had a long discussion with him about the progressive nature of diabetes and the pathology behind its complications. -his diabetes is complicated by CKD stage 3 and he remains at a high risk for more acute and chronic complications which include CAD, CVA, CKD, retinopathy, and neuropathy. These are all discussed in detail with him.  - I have counseled him on diet  and weight management  by adopting a carbohydrate restricted/protein rich diet. Patient is encouraged to switch to  unprocessed or minimally processed    complex starch and increased protein intake (animal or plant source), fruits, and vegetables. -  he is advised to stick to a routine mealtimes to eat 3 meals  a day and avoid unnecessary snacks ( to snack only to correct hypoglycemia).   - he admits that there is a room for improvement in his food and drink choices. - Suggestion is made for him to avoid simple carbohydrates  from his  diet including Cakes, Sweet Desserts, Ice Cream, Soda (diet and regular), Sweet Tea, Candies, Chips, Cookies, Store Bought Juices, Alcohol in Excess of  1-2 drinks a day, Artificial Sweeteners,  Coffee Creamer, and "Sugar-free" Products. This will help patient to have more stable blood glucose profile and potentially avoid unintended weight gain.  - he will be scheduled with Jearld Fenton, RDN, CDE for diabetes education.  - I have approached him with the following individualized plan to manage  his diabetes and patient agrees:   -Given his current and prevailing glycemic burden, he will continue to benefit from insulin treatment.  He is approached for higher dose of basal insulin.  I discussed and increase his Levemir to 40 units nightly,  associated with strict monitoring of glucose 4 times a day-before meals and at bedtime. -We will be considered for prandial insulin, if he continues to have significantly above target postprandial hyperglycemia.   - he  is encouraged to call clinic for blood glucose levels less than 70 or above 300 mg /dl. - he is advised to continue glimepiride 2 mg p.o. daily at breakfast, therapeutically suitable for patient .  - he is not a candidate for metformin, SGLT2 inhibitors due to concurrent renal insufficiency. - Specific targets for  A1c;  LDL, HDL, Triglycerides, and  Waist Circumference were discussed with the patient.  2) Blood Pressure /Hypertension:  his blood pressure is  controlled to target.   he is now on antihypertensive medications for now.  Will be considered for low-dose ACE inhibitors on subsequent visits.  3) Lipids/Hyperlipidemia:   Review of his recent lipid panel showed uncontrolled  LDL at 99 .  he  is advised to continue    simvastatin 20 mg p.o. nightly.  Side effects and precautions discussed with him.  4)  Weight/Diet:  Body mass index is 26.96 kg/m.  -   he is not a candidate for weight loss, however, I discussed with him the fact that  loss of 5 - 10% of his  current body weight will have the most impact on his diabetes management.  Exercise, and detailed carbohydrates information provided  -  detailed on discharge instructions.  5) Chronic Care/Health Maintenance:  -he  is on Statin medications and  is encouraged to initiate and continue to follow up with Ophthalmology, Dentist,  Podiatrist at least yearly or according to recommendations, and advised to   stay away from smoking. I have recommended yearly flu vaccine and pneumonia vaccine at least every 5 years; moderate intensity exercise for up to 150 minutes weekly; and  sleep for at least 7 hours a day.  - he is  advised to maintain close follow up with [name], MD for primary care needs, as well as his other providers for optimal and coordinated care.  - Time spent with the patient: 45 minutes, of which >50% was spent in obtaining information about his symptoms, reviewing his previous labs/studies, evaluations, and treatments, counseling him about his is currently uncontrolled, complicated type 2 diabetes, hyperlipidemia, and developing plans for long term treatment based on the latest standards of care/guidelines.  Please refer to " Patient Self Inventory" in the Media  tab for reviewed elements of pertinent patient history.  Jeffery [name] participated in the discussions, expressed understanding, and voiced agreement with the above plans.  All questions were answered to his satisfaction. he is encouraged to contact clinic should he have any questions or concerns prior to his return visit.  Follow up plan: - Return in about 10 days (around 08/23/2019) for Follow up with Meter and Logs Only - no Labs.  Glade Lloyd, MD [facility] [address] [place], [place] [address] Phone: [phone]  Fax: [phone]    08/13/2019, 1:17 PM  This note was partially dictated with voice recognition software. Similar sounding  words can be transcribed inadequately or may not  be corrected upon review.

## 2019-08-13 NOTE — Patient Instructions (Signed)

## 2019-08-22 ENCOUNTER — Other Ambulatory Visit: Payer: Self-pay

## 2019-08-22 ENCOUNTER — Ambulatory Visit (INDEPENDENT_AMBULATORY_CARE_PROVIDER_SITE_OTHER): Payer: Medicare Other | Admitting: "Endocrinology

## 2019-08-22 ENCOUNTER — Encounter: Payer: Self-pay | Admitting: "Endocrinology

## 2019-08-22 DIAGNOSIS — Z794 Long term (current) use of insulin: Secondary | ICD-10-CM

## 2019-08-22 DIAGNOSIS — N183 Chronic kidney disease, stage 3 unspecified: Secondary | ICD-10-CM

## 2019-08-22 DIAGNOSIS — E1122 Type 2 diabetes mellitus with diabetic chronic kidney disease: Secondary | ICD-10-CM | POA: Diagnosis not present

## 2019-08-22 MED ORDER — LEVEMIR FLEXTOUCH 100 UNIT/ML ~~LOC~~ SOPN: 60.0000 [IU] | PEN_INJECTOR | Freq: Every day | SUBCUTANEOUS | 2 refills | Status: DC

## 2019-08-22 NOTE — Progress Notes (Signed)
08/22/2019, 3:46 PM                                                    Endocrinology Telehealth Visit Follow up Note -During COVID -19 Pandemic  This visit type was conducted due to national recommendations for restrictions regarding the COVID-19 Pandemic  in an effort to limit this patient's exposure and mitigate transmission of the corona virus.  Due to his co-morbid illnesses, Jeffery [name] is at  moderate to high risk for complications without adequate follow up.  This format is felt to be most appropriate for him at this time.  I connected with this patient on 08/22/2019   by telephone and verified that I am speaking with the correct person using two identifiers. Jeffery [name], 19-Aug-1951. he has verbally consented to this visit. All issues noted in this document were discussed and addressed. The format was not optimal for physical exam.  He is assisted by his wife [name].   Subjective:    Patient ID: Jeffery [name], male    DOB: 04-18-1951.  Jeffery [name] is being engaged in telehealth via telephone in follow-up after he was seen in consultation for management of currently uncontrolled symptomatic diabetes requested by  [name], MD.   Past Medical History:  Diagnosis Date  . Cancer [facility])    prostate  . Cervical spondylosis   . Diabetes mellitus, type II ([place])   . Glaucoma   . Hematuria    radiation cystitis  . History of migraine headaches   . Hx of arthroscopic knee surgery    Left knee  . Hyperlipidemia   . Hypertension   . Hypertension   . Hypertension   . Impaired fasting glucose   . Memory loss   . Progressive gait disorder   . Renal disorder    kidney stone  . Varicose veins     Past Surgical History:  Procedure Laterality Date  . CHOLECYSTECTOMY    . EYE SURGERY    . PROSTATE SURGERY      Social History   Socioeconomic History  . Marital status: Married    Spouse name: Not  on file  . Number of children: 2  . Years of education: HS  . Highest education level: Not on file  Occupational History  . Occupation: Information systems manager  Social Needs  . Financial resource strain: Not on file  . Food insecurity    Worry: Not on file    Inability: Not on file  . Transportation needs    Medical: Not on file    Non-medical: Not on file  Tobacco Use  . Smoking status: Former Research scientist (life sciences)  . Smokeless tobacco: Never Used  Substance and Sexual Activity  . Alcohol use: Yes    Comment: 0.5 pint of wine daily  . Drug use: No  . Sexual activity: Not on file  Lifestyle  . Physical activity    Days per week: Not on file  Minutes per session: Not on file  . Stress: Not on file  Relationships  . Social Herbalist on phone: Not on file    Gets together: Not on file    Attends religious service: Not on file    Active member of club or organization: Not on file    Attends meetings of clubs or organizations: Not on file    Relationship status: Not on file  Other Topics Concern  . Not on file  Social History Narrative   Lives at home with his wife.   Right-handed.   Occasional caffeine use.    Family History  Problem Relation Age of Onset  . Glaucoma Mother   . Arthritis Father   . Diabetes Father   . Fibromyalgia Sister   . Gait disorder Maternal Uncle        progessive gait disorder    Outpatient Encounter Medications as of 08/22/2019  Medication Sig  . aspirin EC 81 MG EC tablet Take 1 tablet (81 mg total) by mouth daily.  . divalproex (DEPAKOTE) 500 MG DR tablet Take 500 mg by mouth 1 day or 1 dose.  . donepezil (ARICEPT) 10 MG tablet Take one tablet at bedtime.  Please call [phone] to schedule an appt.  Marland Kitchen glimepiride (AMARYL) 2 MG tablet Take 2 mg by mouth every morning.  . Insulin Detemir (LEVEMIR FLEXTOUCH) 100 UNIT/ML Pen Inject 60 Units into the skin at bedtime.  Marland Kitchen KLOR-CON M20 20 MEQ tablet Take 20 mEq by mouth daily.   Marland Kitchen  levETIRAcetam (KEPPRA) 1000 MG tablet Take 1 tablet (1,000 mg total) by mouth 2 (two) times daily.  . memantine (NAMENDA) 10 MG tablet Take 1 tablet (10 mg total) by mouth 2 (two) times daily. Please call [phone] to schedule an appt.  . Multiple Vitamin (MULTIVITAMIN WITH MINERALS) TABS tablet Take 1 tablet by mouth daily.  . simvastatin (ZOCOR) 20 MG tablet Take 20 mg by mouth at bedtime.  . sulfamethoxazole-trimethoprim (BACTRIM DS) 800-160 MG tablet Take 1 tablet by mouth 2 (two) times daily.  . vitamin B-12 1000 MCG tablet Take 1 tablet (1,000 mcg total) by mouth daily.  . [DISCONTINUED] Insulin Detemir (LEVEMIR FLEXTOUCH) 100 UNIT/ML Pen Inject 60 Units into the skin at bedtime.   No facility-administered encounter medications on file as of 08/22/2019.     ALLERGIES: Allergies  Allergen Reactions  . Penicillins Hives, Nausea And Vomiting and Swelling    Swelling of lips Has patient had a PCN reaction causing immediate rash, facial/tongue/throat swelling, SOB or lightheadedness with hypotension: Yes Has patient had a PCN reaction causing severe rash involving mucus membranes or skin necrosis: No Has patient had a PCN reaction that required hospitalization No Has patient had a PCN reaction occurring within the last 10 years: No If all of the above answers are "NO", then may proceed with Cephalosporin use.   . Amlodipine Swelling    Caused swelling in feet and legs  . Atenolol Swelling and Other (See Comments)    Angioedema   . Clonidine Derivatives Swelling and Other (See Comments)    Angioedema   . Metformin And Related     Irritable   . Tape Rash    No plastic, "patterned" tape!!    VACCINATION STATUS:  There is no immunization history on file for this patient.  Diabetes He presents for his follow-up diabetic visit. He has type 2 diabetes mellitus. Onset time: He was diagnosed at approximate age of 45 years.  His disease course has been worsening. There are no  hypoglycemic associated symptoms. Pertinent negatives for hypoglycemia include no confusion, headaches, pallor or seizures. Associated symptoms include blurred vision, polydipsia and polyuria. Pertinent negatives for diabetes include no chest pain, no fatigue, no polyphagia and no weakness. There are no hypoglycemic complications. Symptoms are worsening. Diabetic complications include nephropathy. Risk factors for coronary artery disease include diabetes mellitus, dyslipidemia, hypertension, male sex, sedentary lifestyle and tobacco exposure. Current diabetic treatments: He is taking Levemir 30 units every morning, glimepiride 2 mg p.o. daily at breakfast. He is following a generally unhealthy diet. When asked about meal planning, he reported none. He has not had a previous visit with a dietitian. He rarely participates in exercise. His breakfast blood glucose range is generally >200 mg/dl. His lunch blood glucose range is generally >200 mg/dl. His dinner blood glucose range is generally >200 mg/dl. His bedtime blood glucose range is generally >200 mg/dl. His overall blood glucose range is >200 mg/dl. (He reports that he continues to have severe hyperglycemia, fasting between 227-5 38, prelunch between 441-high , presupper between 500-HI, bedtime between 542- HI .  He is recent A1c was 11.5%.  This is mainly due to the fact that he drinks continuously, alcohol and sweetened beverages.   )  Hyperlipidemia This is a chronic problem. The current episode started more than 1 year ago. The problem is controlled. Exacerbating diseases include chronic renal disease and diabetes. Pertinent negatives include no chest pain, myalgias or shortness of breath. Current antihyperlipidemic treatment includes statins. Risk factors for coronary artery disease include dyslipidemia, diabetes mellitus, hypertension, male sex and a sedentary lifestyle.  Hypertension This is a chronic problem. The current episode started more than 1  year ago. The problem is controlled. Associated symptoms include blurred vision. Pertinent negatives include no chest pain, headaches, neck pain, palpitations or shortness of breath. Risk factors for coronary artery disease include diabetes mellitus, dyslipidemia, male gender, sedentary lifestyle and smoking/tobacco exposure. Hypertensive end-organ damage includes kidney disease. Identifiable causes of hypertension include chronic renal disease.       Objective:    There were no vitals taken for this visit.  Wt Readings from Last 3 Encounters:  08/13/19 210 lb (95.3 kg)  07/06/19 201 lb 8 oz (91.4 kg)  12/04/18 200 lb (90.7 kg)        CMP ( most recent) CMP     Component Value Date/Time   NA 137 07/06/2019 0426   K 4.0 07/06/2019 0426   CL 106 07/06/2019 0426   CO2 23 07/06/2019 0426   GLUCOSE 312 (H) 07/06/2019 0426   BUN 23 07/06/2019 0426   CREATININE 1.41 (H) 07/06/2019 0426   CALCIUM 9.1 07/06/2019 0426   PROT 7.8 07/05/2019 1538   ALBUMIN 3.4 (L) 07/05/2019 1538   AST 19 07/05/2019 1538   ALT 13 07/05/2019 1538   ALKPHOS 97 07/05/2019 1538   BILITOT 1.0 07/05/2019 1538   GFRNONAA 51 (L) 07/06/2019 0426   GFRAA 59 (L) 07/06/2019 0426     Diabetic Labs (most recent): Lab Results  Component Value Date   HGBA1C 11.5 (H) 07/05/2019   HGBA1C 12.8 (H) 02/16/2017     Lipid Panel ( most recent) Lipid Panel     Component Value Date/Time   CHOL 168 07/06/2019 0426   TRIG 63 07/06/2019 0426   HDL 56 07/06/2019 0426   CHOLHDL 3.0 07/06/2019 0426   VLDL 13 07/06/2019 0426   LDLCALC 99 07/06/2019 0426  Lab Results  Component Value Date   TSH 2.706 07/07/2019     Assessment & Plan:   1. Type 2 diabetes mellitus with stage 3 chronic kidney disease, with long-term current use of insulin (HCC)  - Jeffery [name] has currently uncontrolled symptomatic type 2 DM since  68 years of age,  with most recent A1c of 11.5 %.   Recent labs reviewed.  It is reported  glycemic profile is severely uncontrolled - I had a long discussion with him about the progressive nature of diabetes and the pathology behind its complications. -his diabetes is complicated by CKD stage 3 and he remains at a high risk for more acute and chronic complications which include CAD, CVA, CKD, retinopathy, and neuropathy. These are all discussed in detail with him.  - I have counseled him on diet  and weight management  by adopting a carbohydrate restricted/protein rich diet. Patient is encouraged to switch to  unprocessed or minimally processed    complex starch and increased protein intake (animal or plant source), fruits, and vegetables. -  he is advised to stick to a routine mealtimes to eat 3 meals  a day and avoid unnecessary snacks ( to snack only to correct hypoglycemia).   - he  admits there is a room for improvement in his diet and drink choices. -  Suggestion is made for him to avoid simple carbohydrates  from his diet including Cakes, Sweet Desserts / Pastries, Ice Cream, Soda (diet and regular), Sweet Tea, Candies, Chips, Cookies, Sweet Pastries,  Store Bought Juices, Alcohol in Excess of  1-2 drinks a day, Artificial Sweeteners, Coffee Creamer, and "Sugar-free" Products. This will help patient to have stable blood glucose profile and potentially avoid unintended weight gain.   - he will be scheduled with Jearld Fenton, RDN, CDE for diabetes education.  - I have approached him with the following individualized plan to manage  his diabetes and patient agrees:   -His severe hyperglycemia is driven by multiple factors including his numbness drinking alcohol and sweetened beverages, which he is not ready to change.    - Priority #1 in the care of this patient is to avoid hypoglycemia.  I shared my concern with the family that hypoglycemia may put in an hyperosmolar state and potentially resulting hospitalization. -He will likely need multiple daily injections of insulin,  however this is approached slowly.   -I discussed and increased Levemir to 60 units nightly, advised to continue  strict monitoring of glucose 4 times a day-before meals and at bedtime, and will be on phone visit in 10 days for reevaluation. -We will be considered for prandial insulin, if he continues to have significantly above target postprandial hyperglycemia.   - he is encouraged to call clinic for blood glucose levels less than 70 or above 300 mg /dl. - he is advised to continue glimepiride 2 mg p.o. daily at breakfast. - he reports previous GI intolerance to metformin.  He is not a suitable candidate for SGLT2 inhibitors , nor incretin therapy due to his heavy alcohol abuse. - Specific targets for  A1c;  LDL, HDL, Triglycerides, and  Waist Circumference were discussed with the patient.  2) Blood Pressure /Hypertension: he is now on antihypertensive medications for now. he is advised to home monitor blood pressure and report if > 140/90 on 2 separate readings.  Will be considered for low-dose ACE inhibitors on subsequent visits.  3) Lipids/Hyperlipidemia:   Review of his recent lipid panel showed uncontrolled  LDL at 99 .  he  is advised to continue   simvastatin 20 mg p.o. nightly.  Side effects and precautions discussed with him.  4)  Weight/Diet:  he is not a candidate for weight loss, however, I discussed with him the fact that loss of 5 - 10% of his  current body weight will have the most impact on his diabetes management.  Exercise, and detailed carbohydrates information provided  -  detailed on discharge instructions.  5) Chronic Care/Health Maintenance:  -he  is on Statin medications and  is encouraged to initiate and continue to follow up with Ophthalmology, Dentist,  Podiatrist at least yearly or according to recommendations, and advised to   stay away from smoking. I have recommended yearly flu vaccine and pneumonia vaccine at least every 5 years; moderate intensity exercise for up  to 150 minutes weekly; and  sleep for at least 7 hours a day.  -This patient's major health problem is the alcohol abuse.  He is extensively counseled against continued alcohol abuse.  I discussed possibilities including AA counseling in the local area, patient is not interested to consider at this time.  - he is  advised to maintain close follow up with [name], MD for primary care needs, as well as his other providers for optimal and coordinated care.  - Patient Care Time Today:  25 min, of which >50% was spent in  counseling and the rest reviewing his  current and  previous labs/studies, previous treatments, his blood glucose readings, and medications' doses and developing a plan for long-term care based on the latest recommendations for standards of care.   Jeffery [name] participated in the discussions, expressed understanding, and voiced agreement with the above plans.  All questions were answered to his satisfaction. he is encouraged to contact clinic should he have any questions or concerns prior to his return visit.   Follow up plan: - Return in about 10 days (around 09/01/2019) for Follow up with Meter and Logs Only - no Labs.  Glade Lloyd, MD [facility] [address] [place], [place] [address] Phone: [phone]  Fax: [phone]    08/22/2019, 3:46 PM  This note was partially dictated with voice recognition software. Similar sounding words can be transcribed inadequately or may not  be corrected upon review.

## 2019-08-25 IMAGING — CT CT HEAD CODE STROKE W/O CM
2 series · 15 of 40 positions shown, 18 images · non-contrast
Comparison: Head MRI 01/02/2018 and CT 02/15/2017

CLINICAL DATA: Code stroke. Altered level of consciousness. Seizure
like activity.

EXAM:
CT HEAD WITHOUT CONTRAST
TECHNIQUE: Contiguous axial images were obtained from the base of the skull
through the vertex without intravenous contrast.

[Series 2: head w o · axial · 0.47mm/px · z∈[-28,+112]mm · 12 of 34 slices shown, 15 images]
[im 3/34  brain]
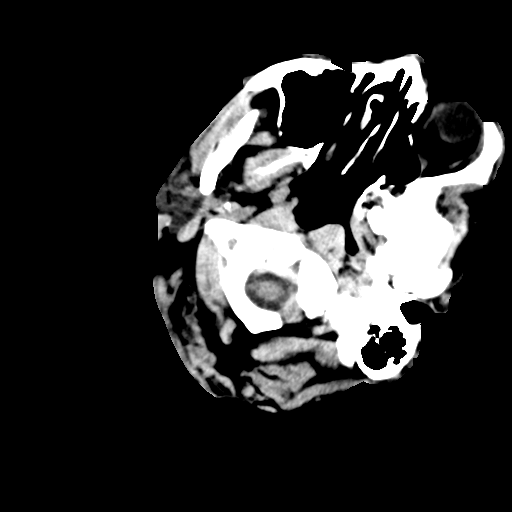
[im 3/34  bone]
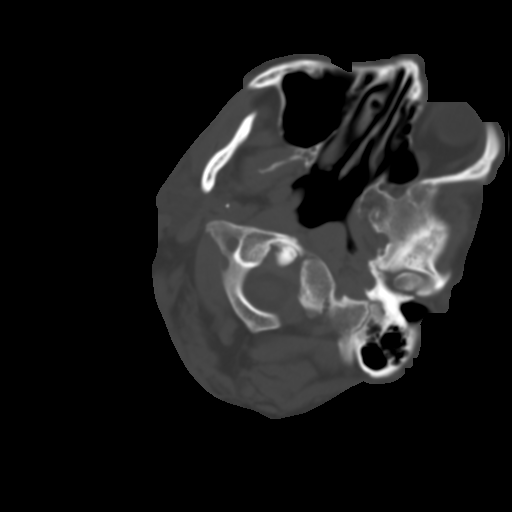
[im 5/34  brain]
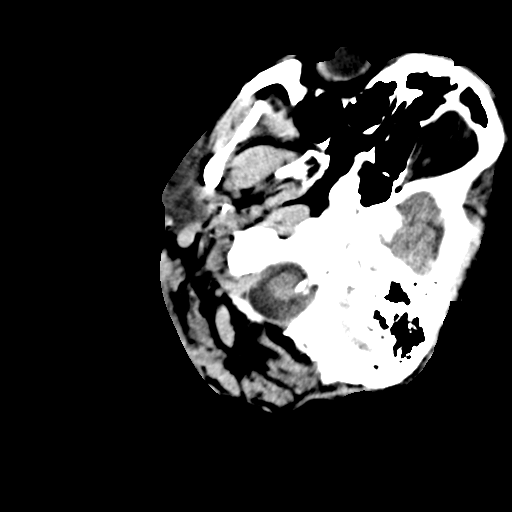
[im 7/34  brain]
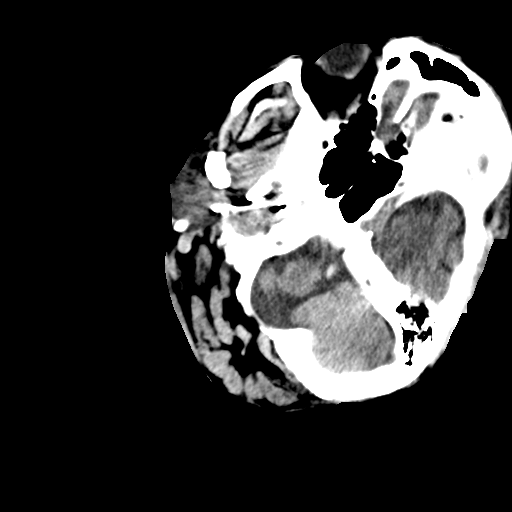
[im 11/34  brain]
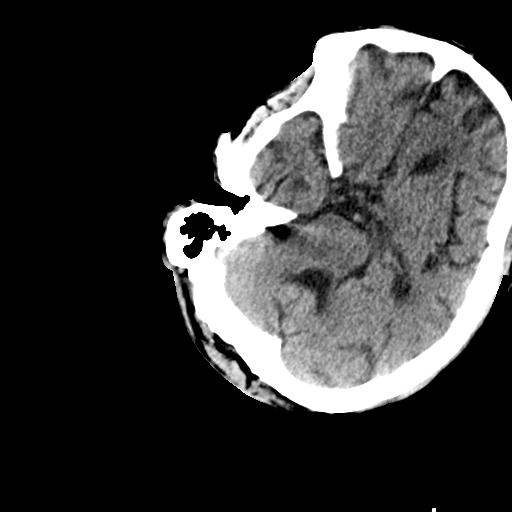
[im 13/34  brain]
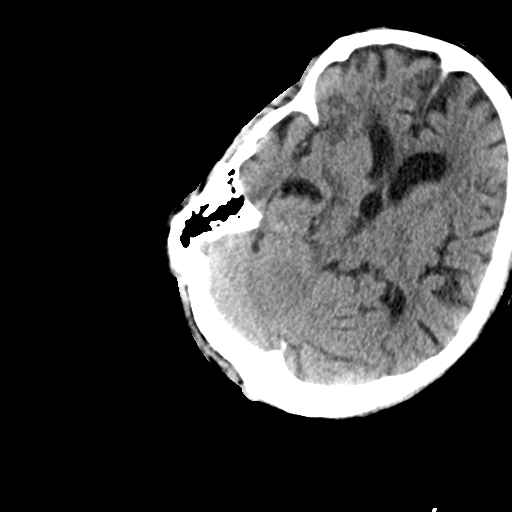
[im 13/34  bone]
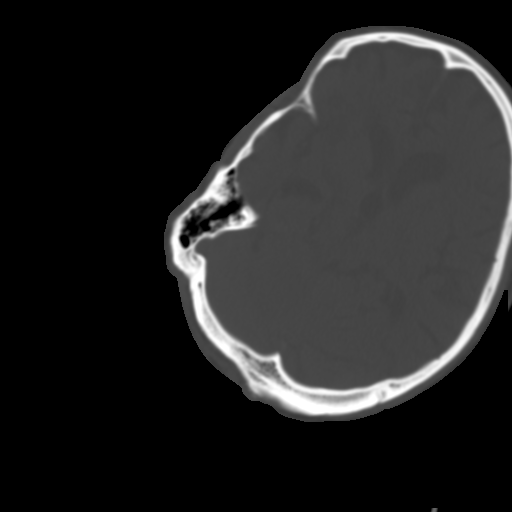
[im 15/34  brain]
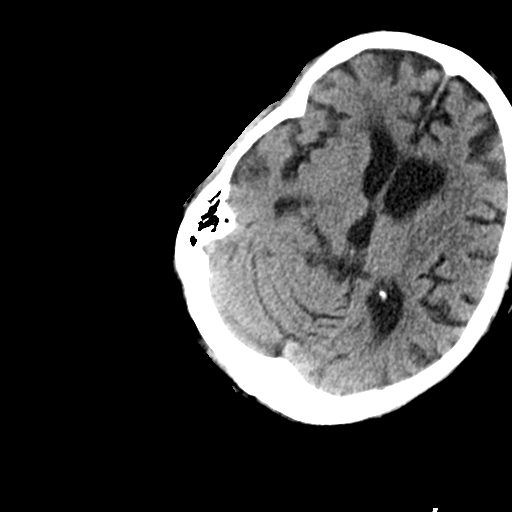
[im 19/34  brain]
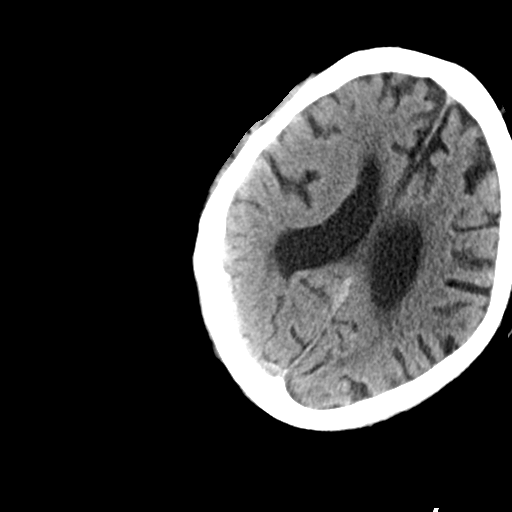
[im 21/34  brain]
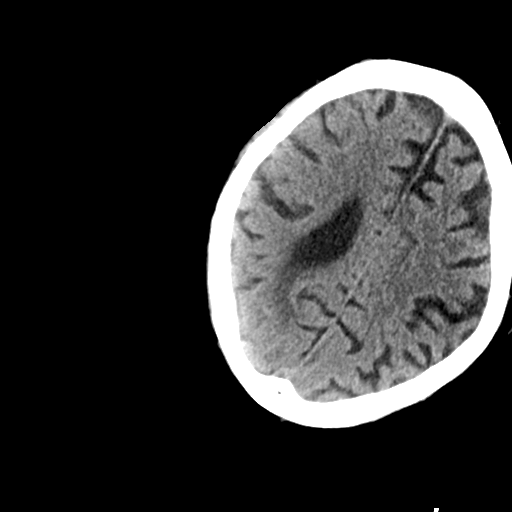
[im 23/34  brain]
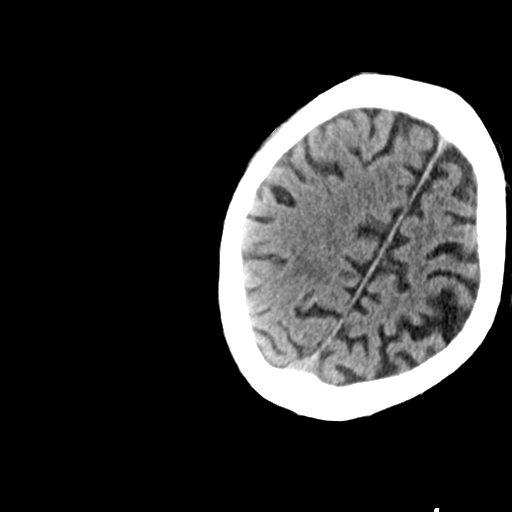
[im 23/34  bone]
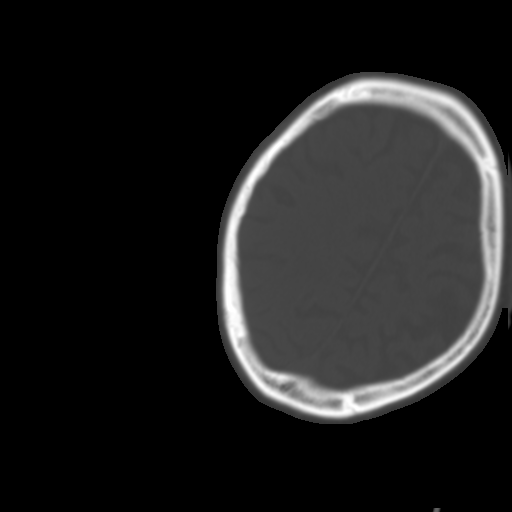
[im 27/34  brain]
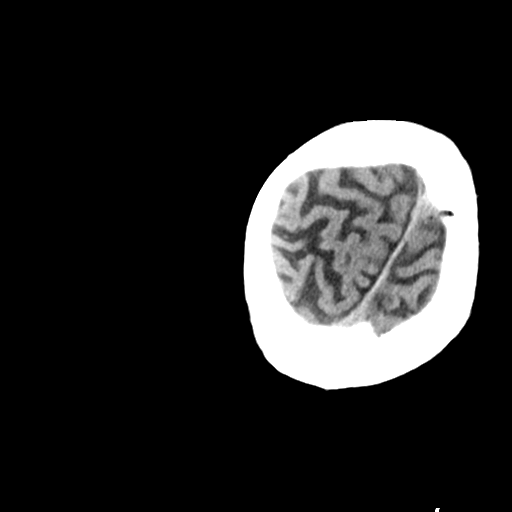
[im 29/34  brain]
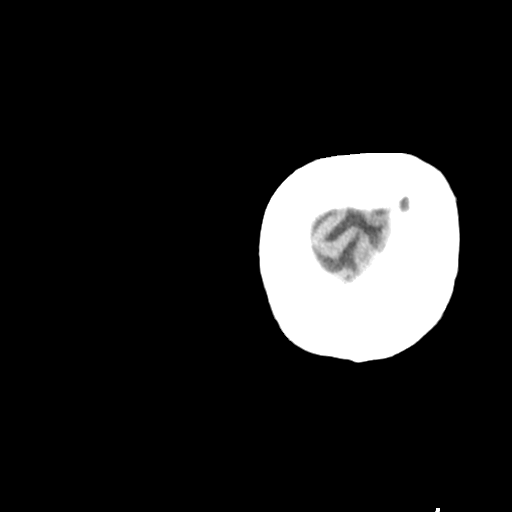
[im 31/34  brain]
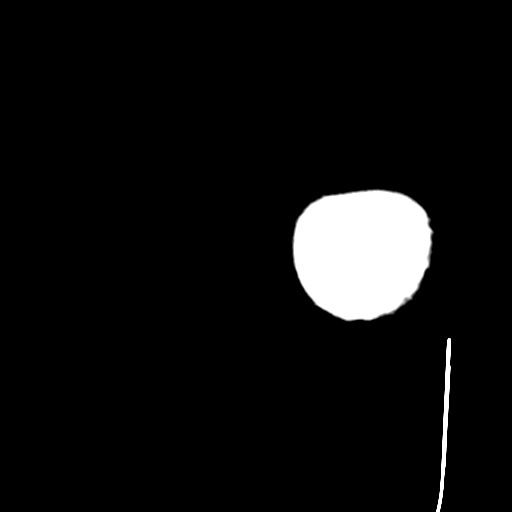

[Series 4: coronal soft · coronal · 0.41mm/px · 3 of 75 slices shown]
[im 25/75  brain]
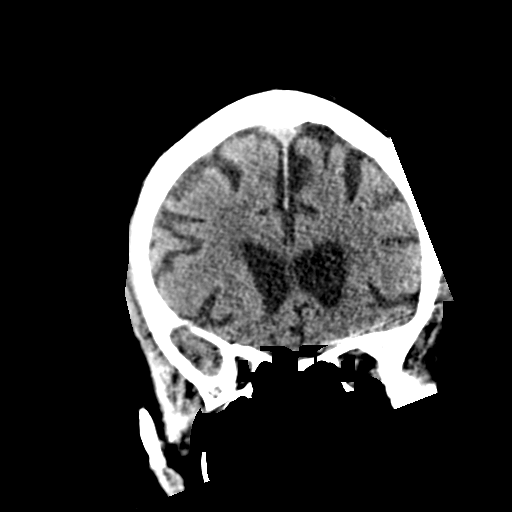
[im 33/75  brain]
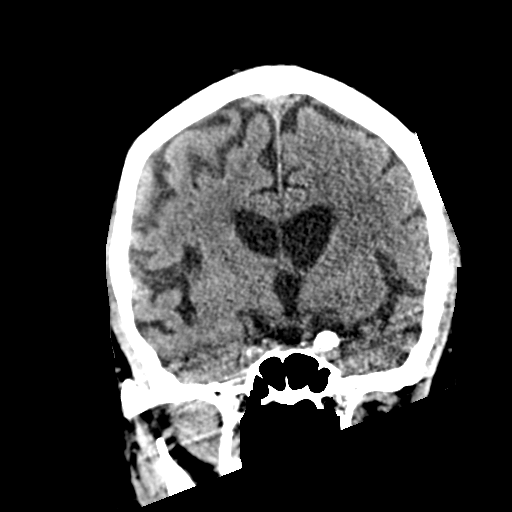
[im 42/75  brain]
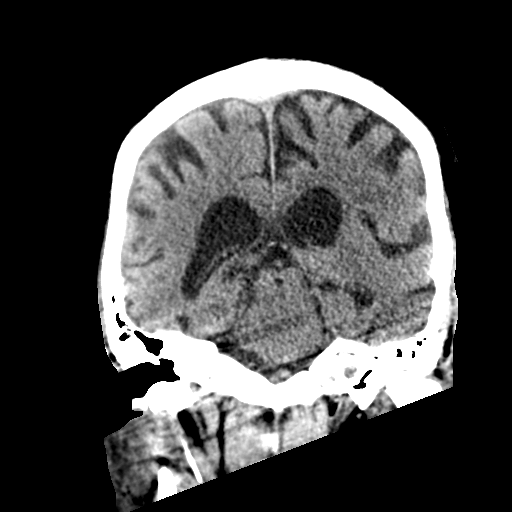

[15 of 40 positions shown; findings below may reference images not displayed]

FINDINGS: The study is mildly motion degraded.

Brain: There is no evidence of acute infarct, intracranial
hemorrhage, mass, midline shift, or extra-axial fluid collection.
There is mildly age advanced cerebral atrophy. Cerebral white matter
hypodensities are nonspecific but compatible with mild chronic small
vessel ischemic disease, stable to mildly progressed from the prior
CT.

Vascular: Calcified atherosclerosis at the skull base. No hyperdense
vessel.

Skull: No fracture or focal osseous lesion is identified although a
portion of the left frontal skull was excluded.

Sinuses/Orbits: Visualized paranasal sinuses and mastoid air cells
are clear. Bilateral cataract extraction is noted.

Other: None.

ASPECTS ([place]berta Stroke Program Early CT Score)

Not scored with this history.
IMPRESSION: 1. No evidence of acute intracranial abnormality.
2. Mild chronic small vessel ischemic disease and cerebral atrophy.

These results were called by telephone at the time of interpretation
on 07/05/2019 at [DATE] to Dr. FREDY MARCELO ZAES , who verbally
acknowledged these results.

## 2019-09-04 ENCOUNTER — Other Ambulatory Visit: Payer: Self-pay

## 2019-09-04 ENCOUNTER — Encounter: Payer: Self-pay | Admitting: "Endocrinology

## 2019-09-04 ENCOUNTER — Ambulatory Visit (INDEPENDENT_AMBULATORY_CARE_PROVIDER_SITE_OTHER): Payer: Medicare Other | Admitting: "Endocrinology

## 2019-09-04 DIAGNOSIS — Z794 Long term (current) use of insulin: Secondary | ICD-10-CM | POA: Diagnosis not present

## 2019-09-04 DIAGNOSIS — E1121 Type 2 diabetes mellitus with diabetic nephropathy: Secondary | ICD-10-CM | POA: Diagnosis not present

## 2019-09-04 DIAGNOSIS — E1122 Type 2 diabetes mellitus with diabetic chronic kidney disease: Secondary | ICD-10-CM

## 2019-09-04 DIAGNOSIS — N1831 Chronic kidney disease, stage 3a: Secondary | ICD-10-CM

## 2019-09-04 MED ORDER — LEVEMIR FLEXTOUCH 100 UNIT/ML ~~LOC~~ SOPN: 70.0000 [IU] | PEN_INJECTOR | Freq: Every day | SUBCUTANEOUS | 2 refills | Status: DC

## 2019-09-04 NOTE — Progress Notes (Signed)
09/04/2019, 4:47 PM                                                    Endocrinology Telehealth Visit Follow up Note -During COVID -19 Pandemic  This visit type was conducted due to national recommendations for restrictions regarding the COVID-19 Pandemic  in an effort to limit this patient's exposure and mitigate transmission of the corona virus.  Due to his co-morbid illnesses, [name] is at  moderate to high risk for complications without adequate follow up.  This format is felt to be most appropriate for him at this time.  I connected with this patient on 09/04/2019   by telephone and verified that I am speaking with the correct person using two identifiers. Jeffery [name], 01/22/1951. he has verbally consented to this visit. All issues noted in this document were discussed and addressed. The format was not optimal for physical exam.  He is assisted by his wife [name].   Subjective:    Patient ID: Jeffery [name], male    DOB: Aug 11, 1951.  Jeffery [name] is being engaged in telehealth via telephone in follow-up after he was seen in consultation for management of currently uncontrolled symptomatic diabetes requested by  [name], MD.   Past Medical History:  Diagnosis Date  . Cancer [facility])    prostate  . Cervical spondylosis   . Diabetes mellitus, type II ([place])   . Glaucoma   . Hematuria    radiation cystitis  . History of migraine headaches   . Hx of arthroscopic knee surgery    Left knee  . Hyperlipidemia   . Hypertension   . Hypertension   . Hypertension   . Impaired fasting glucose   . Memory loss   . Progressive gait disorder   . Renal disorder    kidney stone  . Varicose veins     Past Surgical History:  Procedure Laterality Date  . CHOLECYSTECTOMY    . EYE SURGERY    . PROSTATE SURGERY      Social History   Socioeconomic History  . Marital status: Married    Spouse name: Not  on file  . Number of children: 2  . Years of education: HS  . Highest education level: Not on file  Occupational History  . Occupation: Information systems manager  Social Needs  . Financial resource strain: Not on file  . Food insecurity    Worry: Not on file    Inability: Not on file  . Transportation needs    Medical: Not on file    Non-medical: Not on file  Tobacco Use  . Smoking status: Former Research scientist (life sciences)  . Smokeless tobacco: Never Used  Substance and Sexual Activity  . Alcohol use: Yes    Comment: 0.5 pint of wine daily  . Drug use: No  . Sexual activity: Not on file  Lifestyle  . Physical activity    Days per week: Not on file  Minutes per session: Not on file  . Stress: Not on file  Relationships  . Social Herbalist on phone: Not on file    Gets together: Not on file    Attends religious service: Not on file    Active member of club or organization: Not on file    Attends meetings of clubs or organizations: Not on file    Relationship status: Not on file  Other Topics Concern  . Not on file  Social History Narrative   Lives at home with his wife.   Right-handed.   Occasional caffeine use.    Family History  Problem Relation Age of Onset  . Glaucoma Mother   . Arthritis Father   . Diabetes Father   . Fibromyalgia Sister   . Gait disorder Maternal Uncle        progessive gait disorder    Outpatient Encounter Medications as of 09/04/2019  Medication Sig  . aspirin EC 81 MG EC tablet Take 1 tablet (81 mg total) by mouth daily.  . divalproex (DEPAKOTE) 500 MG DR tablet Take 500 mg by mouth 1 day or 1 dose.  . donepezil (ARICEPT) 10 MG tablet Take one tablet at bedtime.  Please call [phone] to schedule an appt.  Marland Kitchen glimepiride (AMARYL) 2 MG tablet Take 2 mg by mouth every morning.  . Insulin Detemir (LEVEMIR FLEXTOUCH) 100 UNIT/ML Pen Inject 70 Units into the skin daily with breakfast.  . KLOR-CON M20 20 MEQ tablet Take 20 mEq by mouth daily.    Marland Kitchen levETIRAcetam (KEPPRA) 1000 MG tablet Take 1 tablet (1,000 mg total) by mouth 2 (two) times daily.  . memantine (NAMENDA) 10 MG tablet Take 1 tablet (10 mg total) by mouth 2 (two) times daily. Please call [phone] to schedule an appt.  . Multiple Vitamin (MULTIVITAMIN WITH MINERALS) TABS tablet Take 1 tablet by mouth daily.  . simvastatin (ZOCOR) 20 MG tablet Take 20 mg by mouth at bedtime.  . sulfamethoxazole-trimethoprim (BACTRIM DS) 800-160 MG tablet Take 1 tablet by mouth 2 (two) times daily.  . vitamin B-12 1000 MCG tablet Take 1 tablet (1,000 mcg total) by mouth daily.  . [DISCONTINUED] Insulin Detemir (LEVEMIR FLEXTOUCH) 100 UNIT/ML Pen Inject 60 Units into the skin at bedtime.   No facility-administered encounter medications on file as of 09/04/2019.     ALLERGIES: Allergies  Allergen Reactions  . Penicillins Hives, Nausea And Vomiting and Swelling    Swelling of lips Has patient had a PCN reaction causing immediate rash, facial/tongue/throat swelling, SOB or lightheadedness with hypotension: Yes Has patient had a PCN reaction causing severe rash involving mucus membranes or skin necrosis: No Has patient had a PCN reaction that required hospitalization No Has patient had a PCN reaction occurring within the last 10 years: No If all of the above answers are "NO", then may proceed with Cephalosporin use.   . Amlodipine Swelling    Caused swelling in feet and legs  . Atenolol Swelling and Other (See Comments)    Angioedema   . Clonidine Derivatives Swelling and Other (See Comments)    Angioedema   . Metformin And Related     Irritable   . Tape Rash    No plastic, "patterned" tape!!    VACCINATION STATUS:  There is no immunization history on file for this patient.  Diabetes He presents for his follow-up diabetic visit. He has type 2 diabetes mellitus. Onset time: He was diagnosed at approximate age of 80  years. His disease course has been worsening. There are no  hypoglycemic associated symptoms. Pertinent negatives for hypoglycemia include no confusion, headaches, pallor or seizures. Associated symptoms include blurred vision, polydipsia and polyuria. Pertinent negatives for diabetes include no chest pain, no fatigue, no polyphagia and no weakness. There are no hypoglycemic complications. Symptoms are worsening. Diabetic complications include nephropathy. Risk factors for coronary artery disease include diabetes mellitus, dyslipidemia, hypertension, male sex, sedentary lifestyle and tobacco exposure. Current diabetic treatments: He is taking Levemir 30 units every morning, glimepiride 2 mg p.o. daily at breakfast. He is following a generally unhealthy diet. When asked about meal planning, he reported none. He has not had a previous visit with a dietitian. He rarely participates in exercise. His breakfast blood glucose range is generally >200 mg/dl. His lunch blood glucose range is generally >200 mg/dl. His dinner blood glucose range is generally >200 mg/dl. His bedtime blood glucose range is generally >200 mg/dl. His overall blood glucose range is >200 mg/dl. (He reports that he continues to have severe hyperglycemia, fasting between 78-369, , prelunch between 226-425, presupper between 380-534,  bedtime between  382-Hi .  He is recent A1c was 11.5%.  This is mainly due to the fact that he drinks continuously, alcohol and sweetened beverages.  He also skipped his basal insulin on more than 2 occasions. )  Hyperlipidemia This is a chronic problem. The current episode started more than 1 year ago. The problem is controlled. Exacerbating diseases include chronic renal disease and diabetes. Pertinent negatives include no chest pain, myalgias or shortness of breath. Current antihyperlipidemic treatment includes statins. Risk factors for coronary artery disease include dyslipidemia, diabetes mellitus, hypertension, male sex and a sedentary lifestyle.  Hypertension This is a  chronic problem. The current episode started more than 1 year ago. The problem is controlled. Associated symptoms include blurred vision. Pertinent negatives include no chest pain, headaches, neck pain, palpitations or shortness of breath. Risk factors for coronary artery disease include diabetes mellitus, dyslipidemia, male gender, sedentary lifestyle and smoking/tobacco exposure. Hypertensive end-organ damage includes kidney disease. Identifiable causes of hypertension include chronic renal disease.      Objective:    There were no vitals taken for this visit.  Wt Readings from Last 3 Encounters:  08/13/19 210 lb (95.3 kg)  07/06/19 201 lb 8 oz (91.4 kg)  12/04/18 200 lb (90.7 kg)       CMP ( most recent) CMP     Component Value Date/Time   NA 137 07/06/2019 0426   K 4.0 07/06/2019 0426   CL 106 07/06/2019 0426   CO2 23 07/06/2019 0426   GLUCOSE 312 (H) 07/06/2019 0426   BUN 23 07/06/2019 0426   CREATININE 1.41 (H) 07/06/2019 0426   CALCIUM 9.1 07/06/2019 0426   PROT 7.8 07/05/2019 1538   ALBUMIN 3.4 (L) 07/05/2019 1538   AST 19 07/05/2019 1538   ALT 13 07/05/2019 1538   ALKPHOS 97 07/05/2019 1538   BILITOT 1.0 07/05/2019 1538   GFRNONAA 51 (L) 07/06/2019 0426   GFRAA 59 (L) 07/06/2019 0426     Diabetic Labs (most recent): Lab Results  Component Value Date   HGBA1C 11.5 (H) 07/05/2019   HGBA1C 12.8 (H) 02/16/2017     Lipid Panel ( most recent) Lipid Panel     Component Value Date/Time   CHOL 168 07/06/2019 0426   TRIG 63 07/06/2019 0426   HDL 56 07/06/2019 0426   CHOLHDL 3.0 07/06/2019 0426   VLDL 13 07/06/2019 0426  [place] 99 07/06/2019 0426      Lab Results  Component Value Date   TSH 2.706 07/07/2019     Assessment & Plan:   1. Type 2 diabetes mellitus with stage 3 chronic kidney disease, with long-term current use of insulin (HCC)  - Jeffery [name] has currently uncontrolled symptomatic type 2 DM since  68 years of age,  with most recent A1c  of 11.5 %. He continues to have significant hyperglycemia, reportedly refusing and skipping his basal insulin.  - I had a long discussion with him and his wife about the progressive nature of diabetes and the pathology behind its complications. -his diabetes is complicated by CKD stage 3 and he remains at a high risk for more acute and chronic complications which include CAD, CVA, CKD, retinopathy, and neuropathy. These are all discussed in detail with him.  - I have counseled him on diet  and weight management  by adopting a carbohydrate restricted/protein rich diet. Patient is encouraged to switch to  unprocessed or minimally processed    complex starch and increased protein intake (animal or plant source), fruits, and vegetables.  - he  admits there is a room for improvement in his diet and drink choices. -  Suggestion is made for him to avoid simple carbohydrates  from his diet including Cakes, Sweet Desserts / Pastries, Ice Cream, Soda (diet and regular), Sweet Tea, Candies, Chips, Cookies, Sweet Pastries,  Store Bought Juices, Alcohol in Excess of  1-2 drinks a day, Artificial Sweeteners, Coffee Creamer, and "Sugar-free" Products. This will help patient to have stable blood glucose profile and potentially avoid unintended weight gain.  - he will be scheduled with Jearld Fenton, RDN, CDE for diabetes education.  - I have approached him with the following individualized plan to manage  his diabetes and patient agrees:   -His severe hyperglycemia is driven by multiple factors including his  drinking alcohol and sweetened beverages, which he is not ready to change.  He is also reportedly noncompliant to his prescribed insulin doses.  - Priority #1 in the care of this patient is to avoid hypoglycemia.  I shared my concern with the family that he is at risk of hypoglycemia as well as hyperosmolar state.   -He will likely need multiple daily injections of insulin, however this is approached slowly.    -I discussed and switched his Levemir to morning hours between 8 and 9 AM with breakfast and increase dose of 70 units, associated with strict monitoring of blood glucose 4 times a day--before meals and at bedtime, and will be on phone visit in 10 days for reevaluation. -We will be considered for prandial insulin, if he continues to have significantly above target postprandial hyperglycemia.   - he is encouraged to call clinic for blood glucose levels less than 70 or above 300 mg /dl. - he is advised to continue glimepiride 2 mg p.o. daily at breakfast. - he reports previous GI intolerance to metformin.  He is not a suitable candidate for SGLT2 inhibitors , nor incretin therapy due to his heavy alcohol abuse. - Specific targets for  A1c;  LDL, HDL, Triglycerides, and  Waist Circumference were discussed with the patient.  2) Blood Pressure /Hypertension: he is advised to home monitor blood pressure and report if > 140/90 on 2 separate readings. . he is advised to home monitor blood pressure and report if > 140/90 on 2 separate readings. Will be considered for low-dose ACE inhibitors on subsequent visits.  3) Lipids/Hyperlipidemia:   Review of his recent lipid panel showed uncontrolled  LDL at 99 .  he  is advised to continue simvastatin 20 mg p.o. nightly.  Side effects and precautions discussed with him.  4)  Weight/Diet:  he is not a candidate for weight loss, however, I discussed with him the fact that loss of 5 - 10% of his  current body weight will have the most impact on his diabetes management.  Exercise, and detailed carbohydrates information provided  -  detailed on discharge instructions.  5) Chronic Care/Health Maintenance:  -he  is on Statin medications and  is encouraged to initiate and continue to follow up with Ophthalmology, Dentist,  Podiatrist at least yearly or according to recommendations, and advised to   stay away from smoking. I have recommended yearly flu vaccine and  pneumonia vaccine at least every 5 years; moderate intensity exercise for up to 150 minutes weekly; and  sleep for at least 7 hours a day.  -This patient's major health problem is the alcohol abuse.  He is extensively counseled against continued alcohol abuse.  I discussed possibilities including AA counseling in the local area, patient is not interested to consider at this time.  - he is  advised to maintain close follow up with [name], MD for primary care needs, as well as his other providers for optimal and coordinated care.  - Patient Care Time Today:  25 min, of which >50% was spent in  counseling and the rest reviewing his  current and  previous labs/studies, previous treatments, his blood glucose readings, and medications' doses and developing a plan for long-term care based on the latest recommendations for standards of care.   Jeffery [name] participated in the discussions, expressed understanding, and voiced agreement with the above plans.  All questions were answered to his satisfaction. he is encouraged to contact clinic should he have any questions or concerns prior to his return visit.  Follow up plan: - Return in about 3 months (around 12/05/2019) for Bring Meter and Logs- A1c in Office, Include 8 log sheets.  Glade Lloyd, MD [facility] [address] [place], [place] [address] Phone: [phone]  Fax: [phone]    09/04/2019, 4:47 PM  This note was partially dictated with voice recognition software. Similar sounding words can be transcribed inadequately or may not  be corrected upon review.

## 2019-09-06 ENCOUNTER — Ambulatory Visit: Payer: Medicare Other | Admitting: Nutrition

## 2019-12-05 ENCOUNTER — Ambulatory Visit: Payer: Medicare Other | Admitting: "Endocrinology

## 2020-01-01 ENCOUNTER — Ambulatory Visit: Payer: Medicare Other | Admitting: Neurology

## 2020-01-27 ENCOUNTER — Other Ambulatory Visit: Payer: Self-pay | Admitting: "Endocrinology

## 2020-02-13 DIAGNOSIS — E1165 Type 2 diabetes mellitus with hyperglycemia: Secondary | ICD-10-CM | POA: Insufficient documentation

## 2020-02-13 DIAGNOSIS — G40209 Localization-related (focal) (partial) symptomatic epilepsy and epileptic syndromes with complex partial seizures, not intractable, without status epilepticus: Secondary | ICD-10-CM | POA: Insufficient documentation

## 2020-02-13 DIAGNOSIS — F1027 Alcohol dependence with alcohol-induced persisting dementia: Secondary | ICD-10-CM | POA: Insufficient documentation

## 2020-02-13 DIAGNOSIS — G4700 Insomnia, unspecified: Secondary | ICD-10-CM | POA: Insufficient documentation

## 2020-02-13 DIAGNOSIS — IMO0002 Reserved for concepts with insufficient information to code with codable children: Secondary | ICD-10-CM | POA: Insufficient documentation

## 2020-02-29 ENCOUNTER — Other Ambulatory Visit: Payer: Self-pay | Admitting: Neurology

## 2020-03-17 ENCOUNTER — Other Ambulatory Visit: Payer: Self-pay | Admitting: Neurology

## 2020-03-23 ENCOUNTER — Other Ambulatory Visit: Payer: Self-pay

## 2020-03-23 ENCOUNTER — Encounter: Payer: Self-pay | Admitting: Emergency Medicine

## 2020-03-23 ENCOUNTER — Ambulatory Visit
Admission: EM | Admit: 2020-03-23 | Discharge: 2020-03-23 | Disposition: A | Discharge status: Home / Self Care | Payer: Medicare Other | Attending: Emergency Medicine | Admitting: Emergency Medicine

## 2020-03-23 DIAGNOSIS — B029 Zoster without complications: Secondary | ICD-10-CM

## 2020-03-23 MED ORDER — PREDNISONE 10 MG (21) PO TBPK: ORAL_TABLET | ORAL | 0 refills | Status: DC

## 2020-03-23 MED ORDER — VALACYCLOVIR HCL 1 G PO TABS: 1000.0000 mg | ORAL_TABLET | Freq: Three times a day (TID) | ORAL | 0 refills | Status: AC

## 2020-03-23 NOTE — Discharge Instructions (Addendum)
Rest and use ice/heat as needed for symptomatic relief Prescribed valacyclovir 1000mg 3x/day for 7 days Prescribed prednisone taper for inflammation and pain Use OTC medications such as ibuprofen/ tylenol.   Follow up with PCP in 7-10 days if rash is still present Follow up with PCP if symptoms of burning, stinging, tingling or numbness occur after rash resolves, you may need additional treatment Return here or go to ER if you have any new or worsening symptoms (such as eye involvement, severe pain, or signs of secondary infection such as fever, chills, nausea, vomiting, discharge, redness or warmth over site of rash) 

## 2020-03-23 NOTE — ED Provider Notes (Signed)
[facility]    CSN: [id number] Arrival date & time: 03/23/20  1127      History   Chief Complaint Chief Complaint  Patient presents with  . Rash    HPI Jeffery [name] is a 69 y.o. male.   Who presented to the urgent care with a complaint of rash on the right chest and back for the past 7 days.  Reports some blistering.  Denies any change in soap, detergent or anyone worsening of symptoms..  Localized rash to right chest and back.  Described as itchy and painful.  Has not tried any medication..  Nothing made his symptoms worse.  Denies similar symptoms in the past.  Denies chills, fever, nausea, vomiting, diarrhea  The history is provided by the patient. No language interpreter was used.  Rash   Past Medical History:  Diagnosis Date  . Cancer [facility])    prostate  . Cervical spondylosis   . Diabetes mellitus, type II ([place])   . Glaucoma   . Hematuria    radiation cystitis  . History of migraine headaches   . Hx of arthroscopic knee surgery    Left knee  . Hyperlipidemia   . Hypertension   . Hypertension   . Hypertension   . Impaired fasting glucose   . Memory loss   . Progressive gait disorder   . Renal disorder    kidney stone  . Varicose veins     Patient Active Problem List   Diagnosis Date Noted  . Alzheimer's dementia with behavioral disturbance ([place])   . Essential hypertension   . Hyperlipidemia   . Type 2 diabetes mellitus with stage 3a chronic kidney disease, with long-term current use of insulin ([place])   . Alcohol abuse   . Seizures ([place]) 07/05/2019  . Memory disorder 12/13/2017  . AKI (acute kidney injury) ([place]) 02/16/2017  . Hyponatremia 02/16/2017  . Hypokalemia 02/15/2017  . Syncope and collapse 12/07/2012  . Hyperthyroidism 12/07/2012  . Other malaise and fatigue 12/07/2012  . Disturbance of skin sensation 12/07/2012  . Cervical spondylosis without myelopathy 12/07/2012  . Abnormality of gait 12/07/2012  . 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 CHOLECYSTECTOMY    . EYE SURGERY    . PROSTATE SURGERY         Home Medications    Prior to Admission medications   Medication Sig Start Date End Date Taking? Authorizing Provider  aspirin EC 81 MG EC tablet Take 1 tablet (81 mg total) by mouth daily. 07/09/19   Barton Dubois, MD  divalproex (DEPAKOTE) 500 MG DR tablet Take 500 mg by mouth 1 day or 1 dose.    [provider]  donepezil (ARICEPT) 10 MG tablet Take one tablet at bedtime.  Please call [phone] to schedule an appt. 06/19/19   Suzzanne Cloud, NP  glimepiride (AMARYL) 2 MG tablet Take 2 mg by mouth every morning. 04/16/19   [provider]  KLOR-CON M20 20 MEQ tablet Take 20 mEq by mouth daily.  02/18/19   [provider]  LEVEMIR FLEXTOUCH 100 UNIT/ML Pen INJECT 70 UNITS INTO THE SKIN DAILY WITH BREAKFAST. 01/29/20   Cassandria Anger, MD  levETIRAcetam (KEPPRA) 1000 MG tablet Take 1 tablet (1,000 mg total) by mouth 2 (two) times daily. 07/08/19   Barton Dubois, MD  memantine (NAMENDA) 10 MG tablet Take 1 tablet (10 mg total) by mouth 2 (  two) times daily. Please call [phone] to schedule an appt. 06/19/19   Suzzanne Cloud, NP  Multiple Vitamin (MULTIVITAMIN WITH MINERALS) TABS tablet Take 1 tablet by mouth daily. 07/09/19   Barton Dubois, MD  predniSONE (STERAPRED UNI-PAK 21 TAB) 10 MG (21) TBPK tablet Take 6 tabs by mouth daily  for 2 days, then 5 tabs for 2 days, then 4 tabs for 2 days, then 3 tabs for 2 days, 2 tabs for 2 days, then 1 tab by mouth daily for 2 days 03/23/20   Emerson Monte, FNP  simvastatin (ZOCOR) 20 MG tablet Take 20 mg by mouth at bedtime.    [provider]  sulfamethoxazole-trimethoprim (BACTRIM DS) 800-160 MG tablet Take 1 tablet by mouth 2 (two) times daily. 07/09/19   Rolland Porter, MD  valACYclovir (VALTREX) 1000 MG tablet Take 1 tablet (1,000 mg total) by mouth 3  (three) times daily for 7 days. 03/23/20 03/30/20  Juel Ripley, Darrelyn Hillock, FNP  vitamin B-12 1000 MCG tablet Take 1 tablet (1,000 mcg total) by mouth daily. 07/09/19   Barton Dubois, MD    Family History Family History  Problem Relation Age of Onset  . Glaucoma Mother   . Arthritis Father   . Diabetes Father   . Fibromyalgia Sister   . Gait disorder Maternal Uncle        progessive gait disorder    Social History Social History   Tobacco Use  . Smoking status: Former Research scientist (life sciences)  . Smokeless tobacco: Never Used  Substance Use Topics  . Alcohol use: Yes    Comment: 0.5 pint of wine daily  . Drug use: No     Allergies   Penicillins, Amlodipine, Atenolol, Clonidine derivatives, Metformin and related, and Tape   Review of Systems Review of Systems  Constitutional: Negative.   Respiratory: Negative.   Cardiovascular: Negative.   Skin: Positive for color change and rash.  All other systems reviewed and are negative.    Physical Exam Triage Vital Signs ED Triage Vitals [03/23/20 1137]  Enc Vitals Group     BP (!) 145/85     Pulse Rate 66     Resp 18     Temp 97.7 F (36.5 C)     Temp Source Oral     SpO2 95 %     Weight      Height      Head Circumference      Peak Flow      Pain Score 8     Pain Loc      Pain Edu?      Excl. in [place]?    No data found.  Updated Vital Signs BP (!) 145/85 (BP Location: Right Arm)   Pulse 66   Temp 97.7 F (36.5 C) (Oral)   Resp 18   SpO2 95%   Visual Acuity Right Eye Distance:   Left Eye Distance:   Bilateral Distance:    Right Eye Near:   Left Eye Near:    Bilateral Near:     Physical Exam Vitals and nursing note reviewed.  Constitutional:      General: He is not in acute distress.    Appearance: Normal appearance. He is normal weight. He is not ill-appearing, toxic-appearing or diaphoretic.  Cardiovascular:     Rate and Rhythm: Normal rate and regular rhythm.     Pulses: Normal pulses.     Heart sounds: Normal heart  sounds. No murmur. No friction rub. No gallop.  Pulmonary:     Effort: Pulmonary effort is normal. No respiratory distress.     Breath sounds: Normal breath sounds. No stridor. No wheezing, rhonchi or rales.  Chest:     Chest wall: No tenderness.  Skin:    General: Skin is warm.     Findings: Rash present. Rash is crusting and vesicular.  Neurological:     Mental Status: He is alert.      UC Treatments / Results  Labs (all labs ordered are listed, but only abnormal results are displayed) Labs Reviewed - No data to display  EKG   Radiology No results found.  Procedures Procedures (including critical care time)  Medications Ordered in UC Medications - No data to display  Initial Impression / Assessment and Plan / UC Course  I have reviewed the triage vital signs and the nursing notes.  Pertinent labs & imaging results that were available during my care of the patient were reviewed by me and considered in my medical decision making (see chart for details).    Patient is stable for discharge and will be treated for possible shingles. Valacyclovir and prednisone taper were prescribed.  Follow-up with PCP.  Return or go to ED for worsening of symptoms. Final Clinical Impressions(s) / UC Diagnoses   Final diagnoses:  Herpes zoster without complication     Discharge Instructions     Rest and use ice/heat as needed for symptomatic relief Prescribed valacyclovir 1000mg  3x/day for 7 days Prescribed prednisone taper for inflammation and pain Use OTC medications such as ibuprofen/ tylenol.   Follow up with PCP in 7-10 days if rash is still present Follow up with PCP if symptoms of burning, stinging, tingling or numbness occur after rash resolves, you may need additional treatment Return here or go to ER if you have any new or worsening symptoms (such as eye involvement, severe pain, or signs of secondary infection such as fever, chills, nausea, vomiting, discharge, redness  or warmth over site of rash)     ED Prescriptions    Medication Sig Dispense Auth. Provider   predniSONE (STERAPRED UNI-PAK 21 TAB) 10 MG (21) TBPK tablet Take 6 tabs by mouth daily  for 2 days, then 5 tabs for 2 days, then 4 tabs for 2 days, then 3 tabs for 2 days, 2 tabs for 2 days, then 1 tab by mouth daily for 2 days 42 tablet Fahad Cisse, Darrelyn Hillock, FNP   valACYclovir (VALTREX) 1000 MG tablet Take 1 tablet (1,000 mg total) by mouth 3 (three) times daily for 7 days. 21 tablet Jefferie Holston, Darrelyn Hillock, FNP     PDMP not reviewed this encounter.   Emerson Monte, FNP 03/23/20 1212

## 2020-03-23 NOTE — ED Triage Notes (Signed)
Pt here with painful rash to right chest and back that doesn't pass midline; blistering noted

## 2020-04-19 ENCOUNTER — Other Ambulatory Visit: Payer: Self-pay | Admitting: Neurology

## 2020-04-21 NOTE — Telephone Encounter (Signed)
I called pt's wife, [name] per [place]. Pt is overdue for an appt but we received a refill request for donepezil and memantine. Pt's wife reports that he does not need a refill right now on either of those. Pt's wife is agreeable to a f/u appt on 05/12/20 at 2:15pm, check in at 1:45pm. Pt's wife verbalized understanding of appt date and time.

## 2020-04-25 ENCOUNTER — Other Ambulatory Visit: Payer: Self-pay | Admitting: "Endocrinology

## 2020-05-12 ENCOUNTER — Ambulatory Visit: Payer: Self-pay | Admitting: Neurology

## 2020-05-16 ENCOUNTER — Other Ambulatory Visit: Payer: Self-pay | Admitting: "Endocrinology

## 2020-06-05 ENCOUNTER — Other Ambulatory Visit: Payer: Self-pay | Admitting: Neurology

## 2020-06-05 NOTE — Telephone Encounter (Signed)
Called wife requested early appt.  She has been sick and her taking time off work she has had to cancel previous appts.  I made 07-29-20 at 0730, relayed to get here [phone] will need to do MMSE, she asked about MRI I relayed that not usually done unless acute changes, but can discuss with Dr. Krista Blue.  She verbalized understanding.  Will refill memantine until this appt.

## 2020-07-29 ENCOUNTER — Ambulatory Visit: Payer: Self-pay | Admitting: Neurology

## 2020-08-27 ENCOUNTER — Other Ambulatory Visit: Payer: Self-pay | Admitting: Neurology

## 2020-09-03 ENCOUNTER — Other Ambulatory Visit: Payer: Self-pay | Admitting: Neurology

## 2021-02-05 ENCOUNTER — Encounter (HOSPITAL_COMMUNITY): Payer: Self-pay

## 2021-02-05 ENCOUNTER — Emergency Department (HOSPITAL_COMMUNITY)
Admission: EM | Admit: 2021-02-05 | Discharge: 2021-02-06 | Disposition: A | Discharge status: Home / Self Care | Payer: Medicare Other | Attending: Emergency Medicine | Admitting: Emergency Medicine

## 2021-02-05 ENCOUNTER — Other Ambulatory Visit: Payer: Self-pay

## 2021-02-05 DIAGNOSIS — F0281 Dementia in other diseases classified elsewhere with behavioral disturbance: Secondary | ICD-10-CM | POA: Diagnosis present

## 2021-02-05 DIAGNOSIS — F02818 Dementia in other diseases classified elsewhere, unspecified severity, with other behavioral disturbance: Secondary | ICD-10-CM | POA: Diagnosis present

## 2021-02-05 DIAGNOSIS — N1831 Chronic kidney disease, stage 3a: Secondary | ICD-10-CM | POA: Diagnosis not present

## 2021-02-05 DIAGNOSIS — Z20822 Contact with and (suspected) exposure to covid-19: Secondary | ICD-10-CM | POA: Diagnosis not present

## 2021-02-05 DIAGNOSIS — R739 Hyperglycemia, unspecified: Secondary | ICD-10-CM

## 2021-02-05 DIAGNOSIS — E1122 Type 2 diabetes mellitus with diabetic chronic kidney disease: Secondary | ICD-10-CM | POA: Diagnosis not present

## 2021-02-05 DIAGNOSIS — Z8546 Personal history of malignant neoplasm of prostate: Secondary | ICD-10-CM | POA: Diagnosis not present

## 2021-02-05 DIAGNOSIS — I129 Hypertensive chronic kidney disease with stage 1 through stage 4 chronic kidney disease, or unspecified chronic kidney disease: Secondary | ICD-10-CM | POA: Diagnosis not present

## 2021-02-05 DIAGNOSIS — Z7984 Long term (current) use of oral hypoglycemic drugs: Secondary | ICD-10-CM | POA: Diagnosis not present

## 2021-02-05 DIAGNOSIS — F1020 Alcohol dependence, uncomplicated: Secondary | ICD-10-CM | POA: Diagnosis not present

## 2021-02-05 DIAGNOSIS — F1010 Alcohol abuse, uncomplicated: Secondary | ICD-10-CM | POA: Diagnosis present

## 2021-02-05 DIAGNOSIS — F10920 Alcohol use, unspecified with intoxication, uncomplicated: Secondary | ICD-10-CM

## 2021-02-05 DIAGNOSIS — Z87891 Personal history of nicotine dependence: Secondary | ICD-10-CM | POA: Diagnosis not present

## 2021-02-05 DIAGNOSIS — F1027 Alcohol dependence with alcohol-induced persisting dementia: Secondary | ICD-10-CM | POA: Diagnosis not present

## 2021-02-05 DIAGNOSIS — I10 Essential (primary) hypertension: Secondary | ICD-10-CM

## 2021-02-05 DIAGNOSIS — F0151 Vascular dementia with behavioral disturbance: Secondary | ICD-10-CM | POA: Diagnosis not present

## 2021-02-05 DIAGNOSIS — N289 Disorder of kidney and ureter, unspecified: Secondary | ICD-10-CM | POA: Diagnosis not present

## 2021-02-05 DIAGNOSIS — Z79899 Other long term (current) drug therapy: Secondary | ICD-10-CM | POA: Insufficient documentation

## 2021-02-05 DIAGNOSIS — Z7982 Long term (current) use of aspirin: Secondary | ICD-10-CM | POA: Diagnosis not present

## 2021-02-05 DIAGNOSIS — G309 Alzheimer's disease, unspecified: Secondary | ICD-10-CM | POA: Diagnosis present

## 2021-02-05 HISTORY — DX: Alcohol dependence with alcohol-induced persisting dementia: F10.27

## 2021-02-05 LAB — CBC
HCT: 46.3 % (ref 39.0–52.0)
Hemoglobin: 15.4 g/dL (ref 13.0–17.0)
MCH: 33.3 pg (ref 26.0–34.0)
MCHC: 33.3 g/dL (ref 30.0–36.0)
MCV: 100 fL (ref 80.0–100.0)
Platelets: 144 10*3/uL — ABNORMAL LOW (ref 150–400)
RBC: 4.63 MIL/uL (ref 4.22–5.81)
RDW: 12.3 % (ref 11.5–15.5)
WBC: 4.8 10*3/uL (ref 4.0–10.5)
nRBC: 0 % (ref 0.0–0.2)

## 2021-02-05 NOTE — ED Notes (Signed)
Pt dressed out in scrubs, all wires and monitor cables removed from room. Safety sitter at bedside.  Pt belongings placed in locker

## 2021-02-05 NOTE — ED Triage Notes (Addendum)
Pt brought in under IVC by [facility]. IVC paperwork states pt's wife is fearful that pt will harm himself or her. States he has made threats against her. Pt denies SI or HI. Pt with dementia. Pt does not understand why he is here.

## 2021-02-06 LAB — COMPREHENSIVE METABOLIC PANEL
ALT: 30 U/L (ref 0–44)
AST: 31 U/L (ref 15–41)
Albumin: 4.1 g/dL (ref 3.5–5.0)
Alkaline Phosphatase: 107 U/L (ref 38–126)
Anion gap: 11 (ref 5–15)
BUN: 20 mg/dL (ref 8–23)
CO2: 22 mmol/L (ref 22–32)
Calcium: 9 mg/dL (ref 8.9–10.3)
Chloride: 101 mmol/L (ref 98–111)
Creatinine, Ser: 1.75 mg/dL — ABNORMAL HIGH (ref 0.61–1.24)
GFR, Estimated: 41 mL/min — ABNORMAL LOW (ref >60)
Glucose, Bld: 519 mg/dL (ref 70–99)
Potassium: 3.8 mmol/L (ref 3.5–5.1)
Sodium: 134 mmol/L — ABNORMAL LOW (ref 135–145)
Total Bilirubin: 0.8 mg/dL (ref 0.3–1.2)
Total Protein: 8.1 g/dL (ref 6.5–8.1)

## 2021-02-06 LAB — CBG MONITORING, ED
Glucose-Capillary: 186 mg/dL — ABNORMAL HIGH (ref 70–99)
Glucose-Capillary: 123 mg/dL — ABNORMAL HIGH (ref 70–99)

## 2021-02-06 LAB — SALICYLATE LEVEL: Salicylate Lvl: <7 mg/dL — ABNORMAL LOW (ref 7.0–30.0)

## 2021-02-06 LAB — SARS CORONAVIRUS 2 (TAT 6-24 HRS): SARS Coronavirus 2: NEGATIVE

## 2021-02-06 LAB — ETHANOL: Alcohol, Ethyl (B): 171 mg/dL — ABNORMAL HIGH (ref <10)

## 2021-02-06 LAB — ACETAMINOPHEN LEVEL: Acetaminophen (Tylenol), Serum: <10 ug/mL — ABNORMAL LOW (ref 10–30)

## 2021-02-06 MED ORDER — LACTATED RINGERS IV BOLUS
1000.0000 mL | Freq: Once | INTRAVENOUS | Status: AC
  Administered 2021-02-06: 1000 mL via INTRAVENOUS

## 2021-02-06 MED ORDER — DIVALPROEX SODIUM 250 MG PO DR TAB
500.0000 mg | DELAYED_RELEASE_TABLET | ORAL | Status: DC
  Administered 2021-02-06: 500 mg via ORAL
  Filled 2021-02-06: qty 2

## 2021-02-06 MED ORDER — ADULT MULTIVITAMIN W/MINERALS CH
1.0000 | ORAL_TABLET | Freq: Every day | ORAL | Status: DC
  Administered 2021-02-06: 1 via ORAL
  Filled 2021-02-06: qty 1

## 2021-02-06 MED ORDER — VITAMIN B-12 1000 MCG PO TABS
1000.0000 ug | ORAL_TABLET | Freq: Every day | ORAL | Status: DC
  Administered 2021-02-06: 1000 ug via ORAL
  Filled 2021-02-06: qty 1

## 2021-02-06 MED ORDER — INSULIN ASPART 100 UNIT/ML IV SOLN
10.0000 [IU] | Freq: Once | INTRAVENOUS | Status: AC
  Administered 2021-02-06: 10 [IU] via INTRAVENOUS

## 2021-02-06 MED ORDER — ASPIRIN EC 81 MG PO TBEC
81.0000 mg | DELAYED_RELEASE_TABLET | Freq: Every day | ORAL | Status: DC
  Administered 2021-02-06: 81 mg via ORAL
  Filled 2021-02-06: qty 1

## 2021-02-06 MED ORDER — LEVETIRACETAM 500 MG PO TABS
1000.0000 mg | ORAL_TABLET | Freq: Two times a day (BID) | ORAL | Status: DC
  Administered 2021-02-06: 1000 mg via ORAL
  Filled 2021-02-06: qty 2

## 2021-02-06 MED ORDER — MEMANTINE HCL 10 MG PO TABS
10.0000 mg | ORAL_TABLET | Freq: Two times a day (BID) | ORAL | Status: DC
  Administered 2021-02-06: 10 mg via ORAL
  Filled 2021-02-06: qty 1

## 2021-02-06 MED ORDER — DONEPEZIL HCL 5 MG PO TABS: 10.0000 mg | ORAL_TABLET | Freq: Every day | ORAL | Status: DC

## 2021-02-06 MED ORDER — INSULIN DETEMIR 100 UNIT/ML ~~LOC~~ SOLN
70.0000 [IU] | Freq: Every day | SUBCUTANEOUS | Status: DC
  Administered 2021-02-06: 70 [IU] via SUBCUTANEOUS
  Filled 2021-02-06 (×4): qty 0.7

## 2021-02-06 MED ORDER — GLIMEPIRIDE 2 MG PO TABS
2.0000 mg | ORAL_TABLET | Freq: Every morning | ORAL | Status: DC
  Administered 2021-02-06: 2 mg via ORAL
  Filled 2021-02-06 (×4): qty 1

## 2021-02-06 MED ORDER — SIMVASTATIN 10 MG PO TABS: 20.0000 mg | ORAL_TABLET | Freq: Every day | ORAL | Status: DC

## 2021-02-06 MED ORDER — ONDANSETRON HCL 4 MG PO TABS: 4.0000 mg | ORAL_TABLET | Freq: Three times a day (TID) | ORAL | Status: DC | PRN

## 2021-02-06 MED ORDER — ACETAMINOPHEN 325 MG PO TABS: 650.0000 mg | ORAL_TABLET | ORAL | Status: DC | PRN

## 2021-02-06 MED ORDER — POTASSIUM CHLORIDE CRYS ER 20 MEQ PO TBCR
20.0000 meq | EXTENDED_RELEASE_TABLET | Freq: Every day | ORAL | Status: DC
  Administered 2021-02-06: 20 meq via ORAL
  Filled 2021-02-06: qty 2

## 2021-02-06 MED ORDER — ALUM & MAG HYDROXIDE-SIMETH 200-200-20 MG/5ML PO SUSP: 30.0000 mL | Freq: Four times a day (QID) | ORAL | Status: DC | PRN

## 2021-02-06 NOTE — ED Provider Notes (Signed)
Pt has been psych cleared.  He was intoxicated when he said those things about his wife.  He is now sober and denies si/hi.  Pt does not meet involuntary commitment criteria any longer.  He has been psych cleared.  IVC rescinded.    Pt's wife does not want to pick him up.  SW involved.   Isla Pence, MD 02/06/21 [phone]

## 2021-02-06 NOTE — Consult Note (Signed)
Telepsych Consultation   Reason for Consult:  Psych consult Referring Physician:  Delora Fuel, MD Location of Patient: [id number] Location of Provider: [facility]  Patient Identification: Jeffery [name] MRN:  [id number] Principal Diagnosis: Alcohol abuse Diagnosis:  Principal Problem:   Alcohol abuse Active Problems:   Alzheimer's dementia with behavioral disturbance ([place])  Total Time spent with patient: 30 minutes  Subjective:   Jeffery [name] is a 70 y.o. male patient admitted via IVC for threatening to "blow his wife's brains out and blow up the house". Patient has history of dementia and alcohol abuse; BAL 171 on admission.   On assessment patient presents lying in hospital bed; calm and cooperative, alert and oriented to person, place. Disoriented to time states it's "2017 or 2018. I've got prostate cancer but I've had that for a while. My wife got an attitude that's all I know and called police or something. I've never touched her, I guess she got tired of me. But I've never hit her, I'm sure she'd pick up something and knock me over the head if I did. I feel like I want to go home. I feel like I'm in a garage right now". Patient denies mentioning "blowing brains out" further stating "I don't believe in violence, except when I was in school you know how boys wrestle and stuff but that's been 20 years ago".   Patient states his wife's number is [phone] (c), [phone] (h). Patient states he is able to bath himself, endorses incontinence; denies ever wandering off. Patient states he does not currently drive. Patient states he does have guns but they are not in the home due to grandchildren being in the home; states his guns are in his storage building. 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   Patient denies any suicidal or homicidal ideations, auditory or visual hallucinations, and does not appear to be responding to  any external/internal stimuli. Patient speech clear, thought processes coherent; does not appear to be under the influence of any substance at this time. Patient able to complete serial 2's, 7's; unable to complete 3 word recall.   Collateral: [name] [phone] (wife) 4 0939-no answer 1058- no answer [phone]:  11:27- no answer 11:50- wife called provider with Dola Factor (daughter) on phone  Wife states patient does have guns in his building that he only has access to; states she did see a gun in his closet, current location unknown. States she is not able to remove "all of his guns". States patient is aggressive and threatening without the alcohol; poor historian and often forgets his behaviors. Currently requesting assistance out of home placement for the patient as she feels patient is not safe in the home at this stage in his disease process. State patient is able to "hold it together" in front of people but becomes aggressive and threatening towards wife at home. SW notified. TOC consult placed.   HPI:  Jeffery [name] is a 70 year old male who presented to [facility] on 02/05/21 via IVC by his wife for making threats to "blow" her brains out via firearm. Patient has a past history of dementia, alcohol abuse, diabetes, and prostate cancer. Patient BAC 171, bg 519. Patient is incontinent on unit. Patient endorses having firearm in storage unit, not in home; unable to confirm at this time.    Past Psychiatric History:  Alcohol abuse Dementia with behavioral disturbance  Risk to Self:  Risk to Others:   Prior Inpatient Therapy:   Prior Outpatient Therapy:    Past Medical History:  Past Medical History:  Diagnosis Date  . Cancer [facility])    prostate  . Cervical spondylosis   . Dementia associated with alcoholism ([place])    from pt's neurology notes  . Diabetes mellitus, type II ([place])   . Glaucoma   . Hematuria    radiation cystitis  . History of migraine headaches   . Hx of  arthroscopic knee surgery    Left knee  . Hyperlipidemia   . Hypertension   . Hypertension   . Hypertension   . Impaired fasting glucose   . Memory loss   . Progressive gait disorder   . Renal disorder    kidney stone  . Varicose veins     Past Surgical History:  Procedure Laterality Date  . CHOLECYSTECTOMY    . EYE SURGERY    . PROSTATE SURGERY     Family History:  Family History  Problem Relation Age of Onset  . Glaucoma Mother   . Arthritis Father   . Diabetes Father   . Fibromyalgia Sister   . Gait disorder Maternal Uncle        progessive gait disorder   Family Psychiatric  History: not noted Social History:  Social History   Substance and Sexual Activity  Alcohol Use Yes   Comment: 0.5 pint of wine daily     Social History   Substance and Sexual Activity  Drug Use No    Social History   Socioeconomic History  . Marital status: Married    Spouse name: Not on file  . Number of children: 2  . Years of education: HS  . Highest education level: Not on file  Occupational History  . Occupation: Information systems manager  Tobacco Use  . Smoking status: Former Research scientist (life sciences)  . Smokeless tobacco: Never Used  Vaping Use  . Vaping Use: Never used  Substance and Sexual Activity  . Alcohol use: Yes    Comment: 0.5 pint of wine daily  . Drug use: No  . Sexual activity: Not Currently  Other Topics Concern  . Not on file  Social History Narrative   Lives at home with his wife.   Right-handed.   Occasional caffeine use.   Social Determinants of Health   Financial Resource Strain: Not on file  Food Insecurity: Not on file  Transportation Needs: Not on file  Physical Activity: Not on file  Stress: Not on file  Social Connections: Not on file   Additional Social History:   Allergies:   Allergies  Allergen Reactions  . Penicillins Hives, Nausea And Vomiting and Swelling    Swelling of lips Has patient had a PCN reaction causing immediate rash,  facial/tongue/throat swelling, SOB or lightheadedness with hypotension: Yes Has patient had a PCN reaction causing severe rash involving mucus membranes or skin necrosis: No Has patient had a PCN reaction that required hospitalization No Has patient had a PCN reaction occurring within the last 10 years: No If all of the above answers are "NO", then may proceed with Cephalosporin use.   . Amlodipine Swelling    Caused swelling in feet and legs  . Atenolol Swelling and Other (See Comments)    Angioedema   . Clonidine Derivatives Swelling and Other (See Comments)    Angioedema   . Metformin And Related     Irritable   . Tape Rash  No plastic, "patterned" tape!!   Labs:  Results for orders placed or performed during the hospital encounter of 02/05/21 (from the past 48 hour(s))  Comprehensive metabolic panel     Status: Abnormal   Collection Time: 02/05/21 11:26 PM  Result Value Ref Range   Sodium 134 (L) 135 - 145 mmol/L   Potassium 3.8 3.5 - 5.1 mmol/L   Chloride 101 98 - 111 mmol/L   CO2 22 22 - 32 mmol/L   Glucose, Bld 519 (HH) 70 - 99 mg/dL    Comment: Glucose reference range applies only to samples taken after fasting for at least 8 hours. CRITICAL RESULT CALLED TO, READ BACK BY AND VERIFIED WITH: C WATLINGTON,RN@0010  02/06/21 MKELLY    BUN 20 8 - 23 mg/dL   Creatinine, Ser 1.75 (H) 0.61 - 1.24 mg/dL   Calcium 9.0 8.9 - 10.3 mg/dL   Total Protein 8.1 6.5 - 8.1 g/dL   Albumin 4.1 3.5 - 5.0 g/dL   AST 31 15 - 41 U/L   ALT 30 0 - 44 U/L   Alkaline Phosphatase 107 38 - 126 U/L   Total Bilirubin 0.8 0.3 - 1.2 mg/dL   GFR, Estimated 41 (L) >60 mL/min    Comment: (NOTE) Calculated using the CKD-EPI Creatinine Equation (2021)    Anion gap 11 5 - 15    Comment: Performed at [facility], [address] [place], [place] [address]  Ethanol     Status: Abnormal   Collection Time: 02/05/21 11:26 PM  Result Value Ref Range   Alcohol, Ethyl (B) 171 (H) <10 mg/dL    Comment:  (NOTE) Lowest detectable limit for serum alcohol is 10 mg/dL.  For medical purposes only. Performed at [facility], [address], [place], [place] [address]   Salicylate level     Status: Abnormal   Collection Time: 02/05/21 11:26 PM  Result Value Ref Range   Salicylate Lvl <9.2 (L) 7.0 - 30.0 mg/dL    Comment: Performed at [facility], [address] [place], [place] [address]  Acetaminophen level     Status: Abnormal   Collection Time: 02/05/21 11:26 PM  Result Value Ref Range   Acetaminophen (Tylenol), Serum <10 (L) 10 - 30 ug/mL    Comment: (NOTE) Therapeutic concentrations vary significantly. A range of 10-30 ug/mL  may be an effective concentration for many patients. However, some  are best treated at concentrations outside of this range. Acetaminophen concentrations >150 ug/mL at 4 hours after ingestion  and >50 ug/mL at 12 hours after ingestion are often associated with  toxic reactions.  Performed at [facility], [address], [place], [place] [address]   cbc     Status: Abnormal   Collection Time: 02/05/21 11:26 PM  Result Value Ref Range   WBC 4.8 4.0 - 10.5 K/uL   RBC 4.63 4.22 - 5.81 MIL/uL   Hemoglobin 15.4 13.0 - 17.0 g/dL   HCT 46.3 39.0 - 52.0 %   MCV 100.0 80.0 - 100.0 fL   MCH 33.3 26.0 - 34.0 pg   MCHC 33.3 30.0 - 36.0 g/dL   RDW 12.3 11.5 - 15.5 %   Platelets 144 (L) 150 - 400 K/uL   nRBC 0.0 0.0 - 0.2 %    Comment: Performed at [facility], [address], [place], [place] [address]  CBG monitoring, ED     Status: Abnormal   Collection Time: 02/06/21  1:57 AM  Result Value Ref Range   Glucose-Capillary 186 (  H) 70 - 99 mg/dL    Comment: Glucose reference range applies only to samples taken after fasting for at least 8 hours.  CBG monitoring, ED     Status: Abnormal   Collection Time: 02/06/21  8:38 AM  Result Value Ref Range   Glucose-Capillary 123 (H) 70 - 99 mg/dL    Comment: Glucose reference range applies only to samples taken after  fasting for at least 8 hours.   Medications:  Current Facility-Administered Medications  Medication Dose Route Frequency Provider Last Rate Last Admin  . acetaminophen (TYLENOL) tablet 650 mg  650 mg Oral Q1J PRN Delora Fuel, MD      . alum & mag hydroxide-simeth (MAALOX/MYLANTA) 200-200-20 MG/5ML suspension 30 mL  30 mL Oral H4R PRN Delora Fuel, MD      . aspirin EC tablet 81 mg  81 mg Oral Daily Delora Fuel, MD   81 mg at 02/06/21 0959  . divalproex (DEPAKOTE) DR tablet 500 mg  500 mg Oral 1 day or 1 dose Delora Fuel, MD   740 mg at 02/06/21 0958  . donepezil (ARICEPT) tablet 10 mg  10 mg Oral QHS Delora Fuel, MD      . glimepiride (AMARYL) tablet 2 mg  2 mg Oral q morning Delora Fuel, MD   2 mg at 02/06/21 1000  . insulin detemir (LEVEMIR) injection 70 Units  70 Units Subcutaneous Q breakfast Delora Fuel, MD   70 Units at 02/06/21 0954  . levETIRAcetam (KEPPRA) tablet 1,000 mg  8,144 mg Oral BID Delora Fuel, MD   8,185 mg at 02/06/21 0959  . memantine (NAMENDA) tablet 10 mg  10 mg Oral BID Delora Fuel, MD   10 mg at 02/06/21 0958  . multivitamin with minerals tablet 1 tablet  1 tablet Oral Daily Delora Fuel, MD   1 tablet at 02/06/21 0957  . ondansetron (ZOFRAN) tablet 4 mg  4 mg Oral U3J PRN Delora Fuel, MD      . potassium chloride SA (KLOR-CON) CR tablet 20 mEq  20 mEq Oral Daily Delora Fuel, MD   20 mEq at 02/06/21 0957  . simvastatin (ZOCOR) tablet 20 mg  20 mg Oral QHS Delora Fuel, MD      . vitamin B-12 (CYANOCOBALAMIN) tablet 1,000 mcg  1,000 mcg Oral Daily Delora Fuel, MD   4,970 mcg at 02/06/21 2637   Current Outpatient Medications  Medication Sig Dispense Refill  . aspirin EC 81 MG EC tablet Take 1 tablet (81 mg total) by mouth daily. 30 tablet 2  . donepezil (ARICEPT) 10 MG tablet Take one tablet at bedtime.  Please call [phone] to schedule an appt. (Patient taking differently: Take 10 mg by mouth at bedtime.) 90 tablet 1  . glimepiride (AMARYL) 2 MG tablet Take  2 mg by mouth every morning.    Marland Kitchen KLOR-CON M20 20 MEQ tablet Take 20 mEq by mouth daily.     Marland Kitchen LEVEMIR FLEXTOUCH 100 UNIT/ML Pen INJECT 70 UNITS INTO THE SKIN DAILY WITH BREAKFAST. 30 mL 0  . levETIRAcetam (KEPPRA) 1000 MG tablet Take 1 tablet (1,000 mg total) by mouth 2 (two) times daily. 60 tablet 1  . memantine (NAMENDA) 10 MG tablet TAKE 1 TABLET BY MOUTH TWICE A DAY (Patient taking differently: Take 10 mg by mouth 2 (two) times daily.) 180 tablet 0  . simvastatin (ZOCOR) 20 MG tablet Take 20 mg by mouth at bedtime.    . Multiple Vitamin (MULTIVITAMIN WITH MINERALS) TABS tablet Take  1 tablet by mouth daily. (Patient not taking: No sig reported) 30 tablet 1  . vitamin B-12 1000 MCG tablet Take 1 tablet (1,000 mcg total) by mouth daily. (Patient not taking: No sig reported) 30 tablet 1   Musculoskeletal: Strength & Muscle Tone: within normal limits Gait & Station: normal Patient leans: N/A  Psychiatric Specialty Exam: Physical Exam Vitals and nursing note reviewed.  Psychiatric:        Attention and Perception: Attention and perception normal.        Mood and Affect: Mood and affect normal.        Speech: Speech normal.        Behavior: Behavior normal. Behavior is cooperative.        Thought Content: Thought content normal.        Cognition and Memory: Memory is impaired. He exhibits impaired recent memory.        Judgment: Judgment normal.     Review of Systems  Psychiatric/Behavioral: Positive for behavioral problems.  All other systems reviewed and are negative.   Blood pressure (!) 167/87, pulse 79, temperature 97.7 F (36.5 C), temperature source Oral, resp. rate 18, height 6\' 2"  (1.88 m), weight 111.1 kg, SpO2 97 %.Body mass index is 31.46 kg/m.  General Appearance: Casual  Eye Contact:  Fair  Speech:  Clear and Coherent  Volume:  Normal  Mood:  Euthymic  Affect:  Congruent  Thought Process:  Coherent  Orientation:  Other:  person, place only  Thought Content:   Logical  Suicidal Thoughts:  No  Homicidal Thoughts:  No  Memory:  Immediate;   Poor Recent;   Fair Remote;   Fair  Judgement:  Other:  fair  Insight:  Present and Shallow  Psychomotor Activity:  Normal  Concentration:  Concentration: Fair and Attention Span: Fair  Recall:  Poor  Fund of Knowledge:  Fair  Language:  Good  Akathisia:  NA  Handed:  Left  AIMS (if indicated):     Assets:  Communication Skills Desire for Improvement Financial Resources/Insurance Housing Intimacy Leisure Time Physical Health Resilience Social Support Vocational/Educational  ADL's:  Intact  Cognition:  Impaired,  Mild  Sleep:      Treatment Plan Summary: Plan to psychiatrically clear patient at this time. Disposition pending collateral follow-up. Case referred to social work. TOC consult placed; patient's family state not feeling safe in home due to escalating aggressive behaviors.   Disposition: No evidence of imminent risk to self or others at present.   Patient does not meet criteria for psychiatric inpatient admission. Supportive therapy provided about ongoing stressors. Discussed crisis plan, support from social network, calling 911, coming to the Emergency Department, and calling Suicide Hotline. Social work to follow up  This service was provided via telemedicine using a 2-way, Haematologist.  Names of all persons participating in this telemedicine service and their role in this encounter. Name: Arihant Pennings Role: PMHNP  Name: Hampton Abbot Role: Attending MD  Name: Jeffery [name] Role: patient  Name: Lonna Cobb Role: daughter    Jeffery CURRO, NP 02/06/2021 11:44 AM

## 2021-02-06 NOTE — TOC Initial Note (Addendum)
Transition of Care [facility]) - Initial/Assessment Note   Patient Details  Name: Jeffery [name] MRN: [id number] Date of Birth: 25-Mar-1951  Transition of [facility]) CM/SW Contact:    Sherie Don, LCSW Phone Number: 02/06/2021, 3:16 PM  Clinical Narrative: Patient is a 70 year old male who presented to the ED with [facility] under IVC to be assessed by [facility]. Patient has been psych cleared. TOC received consult as patient's wife, Zamar Odwyer, is refusing to pick up the patient now that he is psych cleared and is requesting out-of-home placement. Per chart review, patient does not have Medicaid so he does not have a payor source for long-term care; placement will need to be private pay by the family.   CSW attempted to call patient's wife, but was unable to reach her so VM was left explaining patient is now psych cleared and can return home. CSW called DSS and made a report of abandonment to [place]. CSW awaiting call back to see if report will be screened in or not. TOC to follow.  Addendum: 3:23pm-CSW received return call from [place], Jolene Schimke. Per Ms. Bobette Mo, DSS does not consider abandoning the patient with dementia in the ED abuse/neglect and will need to remain in the ED until placement is found. CSW explained patient does not have a payor source and cannot go to placement without one. CSW explained that since [facility] brought the patient in under IVC, they can transport the patient home after his IVC is rescinded.  Expected Discharge Plan: Home/Self Care Barriers to Discharge: ED Facility/Family Refusing to Allow Patient to Return,ED Unable to Make Contact with Facility/Family  Expected Discharge Plan and Services Expected Discharge Plan: Home/Self Care In-house Referral: Clinical Social Work Living arrangements for the past 2 months: Single Family Home  Prior Living Arrangements/Services Living arrangements for the past 2 months: Single Family Home Lives with:: Spouse Patient  language and need for interpreter reviewed:: Yes Need for Family Participation in Patient Care: Yes (Comment) Care giver support system in place?: Yes (comment) Criminal Activity/Legal Involvement Pertinent to Current Situation/Hospitalization: No - Comment as needed  Emotional Assessment Appearance:: Appears stated age Orientation: : Oriented to Self,Oriented to Place Alcohol / Substance Use: Alcohol Use  Admission diagnosis:  IVC  Patient Active Problem List   Diagnosis Date Noted  . Type II diabetes mellitus, uncontrolled ([place]) 02/13/2020  . Dementia associated with alcoholism ([place]) 02/13/2020  . Complex partial epileptic seizure ([place]) 02/13/2020  . Insomnia disorder related to known organic factor 02/13/2020  . Alzheimer's dementia with behavioral disturbance ([place])   . Essential hypertension   . Hyperlipidemia   . Type 2 diabetes mellitus with stage 3a chronic kidney disease, with long-term current use of insulin ([place])   . Alcohol abuse   . Seizures ([place]) 07/05/2019  . Anemia due to blood loss, chronic 05/27/2018  . Recurrent hematuria 05/27/2018  . History of prostate cancer 05/27/2018  . Acute urinary retention 05/10/2018  . Memory disorder 12/13/2017  . AKI (acute kidney injury) ([place]) 02/16/2017  . Hyponatremia 02/16/2017  . Hypokalemia 02/15/2017  . ED (erectile dysfunction) of organic origin 11/11/2016  . Malignant neoplasm of prostate ([place]) 11/01/2014  . Syncope and collapse 12/07/2012  . Hyperthyroidism 12/07/2012  . Other malaise and fatigue 12/07/2012  . Disturbance of skin sensation 12/07/2012  . Cervical spondylosis without myelopathy 12/07/2012  . Abnormality of gait 12/07/2012  . Cerebellar ataxia in diseases classified elsewhere ([place]) 12/07/2012   PCP:  [name], MD Pharmacy:  [id number] - [place], [place] [address] [place] [address] Phone: [phone] Fax: [phone]  Readmission Risk  Interventions No flowsheet data found.
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
## 2021-02-06 NOTE — ED Notes (Signed)
Rescinded IVC papers faxed to [facility].

## 2021-02-06 NOTE — ED Notes (Signed)
Date and time results received: 02/06/21 0010  Test: Glucose Critical Value: 519  Name of Provider Notified: Dr. Roxanne Mins  Orders Received? Or Actions Taken?: n/a

## 2021-02-06 NOTE — ED Notes (Signed)
TTS in progress 

## 2021-02-06 NOTE — ED Notes (Addendum)
Call placed to [name], SW-left vm that [facility] will not transport pt and to verify wife is aware of d/c

## 2021-02-06 NOTE — ED Notes (Addendum)
Per SW-call placed to [facility] for transport as pt was an IVC pt; [facility] denies transportation home, stating they did not bring the pt in; he was brought in by [facility] and IVC paperwork was completed after arrival; message sent via chat to notify SW

## 2021-02-06 NOTE — BH Assessment (Signed)
Comprehensive Clinical Assessment (CCA) Note  02/06/2021 Jeffery [name] [id number]  DISPOSITION: Gave clinical report to Jeffery Reasoner, NP who recommended Pt be observed and evaluated by psychiatry after Pt's wife has been contacted for collateral information. Notified Jeffery Fuel, MD and Jeffery Brame, RN of recommendation.  The patient demonstrates the following risk factors for suicide: Chronic risk factors for suicide include: substance use disorder, medical illness dementia, diabetes, and prostate cancer and demographic factors (male, >35 y/o). Acute risk factors for suicide include: family or marital conflict. Protective factors for this patient include: positive social support. Considering these factors, the overall suicide risk at this point appears to be low. Patient is appropriate for outpatient follow up.  Therefore, a 1:1 sister for suicide precautions is not recommended. The EDP has been informed through secure chat.  Flowsheet Row ED from 02/05/2021 in [place] No Risk      Pt is a 70 year old married male who presents unaccompanied to [facility] ED after being petitioned for involuntary commitment by his wife, Jeffery [name] [phone]. Affidavit and petition states that Pt has a diagnosis of dementia and that Pt drinks alcohol daily. Affidavit states that Pt is verbally abusive and that Pt has threatening to blow his wife's brains out and blow up the house. Affidavit states Pt has been wandering and has been returned to home by neighbors.   Pt states he does not know why he was brought to [facility]. He states his mood is fine and it is his wife who has a problem. He states she "has an attitude" and does not understand why. Pt describes his mood as "fine" and denies all depressive symptoms. He denies current suicidal ideation or history of suicide attempts. Pt denies any history of intentional self-injurious behaviors. Pt denies current homicidal  ideation or history of violence. Pt denies any history of auditory or visual hallucinations. Pt reports he drinks "one beer" daily and denies other substance use. Pt's blood alcohol level is 171 and urine drug screen is in process.  Pt identifies conflicts with his wife as his primary stressor. He says his wife "gets pissed" for no apparent reason. He says he and his wife have been married for over 69 years and they have two adult children. Pt acknowledges he has prostate cancer and repeatedly says this is the reason he and his wife have not had sex in over a year. He has diabetes and states "I eat whatever I want." Pt's blood glucose was 519. Pt says he has access to firearms. He denies any history of abuse or trauma. He says denies current legal problems and says he had a DUI over 30 years ago. He denies any history of inpatient or outpatient mental health treatment.   TTS attempted to contact petitioner/Pt's wife Jeffery [name] at [phone] and call went to unidentified voicemail.  Pt is dressed in hospital scrubs, alert and oriented x4. Pt speaks in a clear tone, at moderate volume and normal pace. Motor behavior appears normal. Eye contact is good. Pt's mood is irritable and affect is congruent with mood. Thought process is coherent and relevant. There is no indication Pt is currently responding to internal stimuli or experiencing delusional thought content. Pt was initially defensive but became talkative as the assessment progressed. He repeated himself several times during assessment. Pt believes there is nothing wrong with him.   Chief Complaint:  Chief Complaint  Patient presents with  . V70.1  Visit Diagnosis:  F10.20 Alcohol use disorder, Severe F01.51 Major vascular neurocognitive disorder, Probable, With behavioral disturbance  CCA Screening, Triage and Referral (STR)  Patient Reported Information How did you hear about [name]? No data recorded Referral name: No data  recorded Referral phone number: No data recorded  Whom do you see for routine medical problems? No data recorded Practice/Facility Name: No data recorded Practice/Facility Phone Number: No data recorded Name of Contact: No data recorded Contact Number: No data recorded Contact Fax Number: No data recorded Prescriber Name: No data recorded Prescriber Address (if known): No data recorded  What Is the Reason for Your Visit/Call Today? No data recorded How Long Has This Been Causing You Problems? No data recorded What Do You Feel Would Help You the Most Today? No data recorded  Have You Recently Been in Any Inpatient Treatment (Hospital/Detox/Crisis Center/28-Day Program)? No data recorded Name/Location of Program/Hospital:No data recorded How Long Were You There? No data recorded When Were You Discharged? No data recorded  Have You Ever Received Services From [facility] Before? No data recorded Who Do You See at [facility]? No data recorded  Have You Recently Had Any Thoughts About Hurting Yourself? No data recorded Are You Planning to Commit Suicide/Harm Yourself At This time? No data recorded  Have you Recently Had Thoughts About [place]? No data recorded Explanation: No data recorded  Have You Used Any Alcohol or Drugs in the Past 24 Hours? No data recorded How Long Ago Did You Use Drugs or Alcohol? No data recorded What Did You Use and How Much? No data recorded  Do You Currently Have a Therapist/Psychiatrist? No data recorded Name of Therapist/Psychiatrist: No data recorded  Have You Been Recently Discharged From Any Office Practice or Programs? No data recorded Explanation of Discharge From Practice/Program: No data recorded    CCA Screening Triage Referral Assessment Type of Contact: No data recorded Is this Initial or Reassessment? No data recorded Date Telepsych consult ordered in [facility]:  No data recorded Time Telepsych consult ordered in [facility]:  No data  recorded  Patient Reported Information Reviewed? No data recorded Patient Left Without Being Seen? No data recorded Reason for Not Completing Assessment: No data recorded  Collateral Involvement: No data recorded  Does Patient Have a [place]? No data recorded Name and Contact of Legal Guardian: No data recorded If Minor and Not Living with Parent(s), Who has Custody? No data recorded Is CPS involved or ever been involved? No data recorded Is APS involved or ever been involved? No data recorded  Patient Determined To Be At Risk for Harm To Self or Others Based on Review of Patient Reported Information or Presenting Complaint? No data recorded Method: No data recorded Availability of Means: No data recorded Intent: No data recorded Notification Required: No data recorded Additional Information for Danger to Others Potential: No data recorded Additional Comments for Danger to Others Potential: No data recorded Are There Guns or Other Weapons in Your Home? No data recorded Types of Guns/Weapons: No data recorded Are These Weapons Safely Secured?                            No data recorded Who Could Verify You Are Able To Have These Secured: No data recorded Do You Have any Outstanding Charges, Pending Court Dates, Parole/Probation? No data recorded Contacted To Inform of Risk of Harm To Self or Others: No data recorded  Location of Assessment: No data recorded  Does Patient Present under Involuntary Commitment? No data recorded IVC Papers Initial File Date: No data recorded  [place] of Residence: No data recorded  Patient Currently Receiving the Following Services: No data recorded  Determination of Need: No data recorded  Options For Referral: No data recorded    CCA Biopsychosocial Intake/Chief Complaint:  Pt petitioned for IVC by his wife who states Pt is verbally abusive and has threatened to kill her. Pt is abusing alcohol and has dementia. She  reports Pt has been wandering a had to be brought home by neighbors.  Current Symptoms/Problems: Pt denies any mental health symptoms. He reports drinking one beer daily but Pt's blood alcohol level is 171.   Patient Reported Schizophrenia/Schizoaffective Diagnosis in Past: No   Strengths: NA  Preferences: NA  Abilities: NA   Type of Services Patient Feels are Needed: None   Initial Clinical Notes/Concerns: Unable to reach Pt's wife for collateral information.   Mental Health Symptoms Depression:  Irritability   Duration of Depressive symptoms: Greater than two weeks   Mania:  Irritability   Anxiety:   None   Psychosis:  None   Duration of Psychotic symptoms: No data recorded  Trauma:  None   Obsessions:  None   Compulsions:  None   Inattention:  N/A   Hyperactivity/Impulsivity:  N/A   Oppositional/Defiant Behaviors:  Easily annoyed; Angry   Emotional Irregularity:  Intense/inappropriate anger   Other Mood/Personality Symptoms:  NA    Mental Status Exam Appearance and self-care  Stature:  Tall   Weight:  Overweight   Clothing:  -- (Scrubs)   Grooming:  Normal   Cosmetic use:  None   Posture/gait:  Normal   Motor activity:  Not Remarkable   Sensorium  Attention:  Normal   Concentration:  Variable   Orientation:  Object; Person; Place; Time   Recall/memory:  Defective in Short-term   Affect and Mood  Affect:  Congruent   Mood:  Irritable   Relating  Eye contact:  Normal   Facial expression:  Responsive   Attitude toward examiner:  Cooperative; Irritable; Defensive   Thought and Language  Speech flow: Clear and Coherent   Thought content:  Appropriate to Mood and Circumstances   Preoccupation:  None   Hallucinations:  None   Organization:  No data recorded  [employer] of Knowledge:  Average   Intelligence:  Average   Abstraction:  Normal   Judgement:  Impaired   Reality Testing:  Distorted    Insight:  Poor   Decision Making:  Impulsive   Social Functioning  Social Maturity:  Impulsive   Social Judgement:  Normal   Stress  Stressors:  Family conflict   Coping Ability:  Normal   Skill Deficits:  Self-control   Supports:  Family     Religion: Religion/Spirituality Are You A Religious Person?: Yes What is 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: Leisure / Recreation Do You Have Hobbies?: Yes Leisure and Hobbies: Cars  Exercise/Diet: Exercise/Diet Do You Exercise?: Yes What Type of Exercise Do You Do?: Run/Walk How Many Times a Week Do You Exercise?: 6-7 times a week Have You Gained or Lost A Significant Amount of Weight in the Past Six Months?: No Do You Follow a Special Diet?: No Do You Have Any Trouble Sleeping?: No   CCA Employment/Education Employment/Work Situation: Employment / Work Situation Employment situation: Unemployed Patient's job has been impacted by current illness: No What  is the longest time patient has a held a job?: Pt cannot remember 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 Has patient ever been in the [employer]?: No  Education: Education Is Patient Currently Attending School?: No Last Grade Completed: 12 Did Teacher, adult education From [employer]?: Yes Did Physicist, medical?: No Did [place]?: No Did You Have An Individualized Education Program (IIEP): No Did You Have Any Difficulty At School?: No Patient's Education Has Been Impacted by Current Illness: No   CCA Family/Childhood History Family and Relationship History: Family history Marital status: Married Number of Years Married:  (Over 43 years) What types of issues is patient dealing with in the relationship?: Pt reports his wife as had an attitude recently and he does not know why Are you sexually active?: No What is your sexual orientation?: Heterosexual Has your sexual activity been affected by drugs, alcohol,  medication, or emotional stress?: Prostate cancer Does patient have children?: Yes How many children?: 2 How is patient's relationship with their children?: Good relationship with son and daughter  Childhood History:  Childhood History Does patient have siblings?: Yes Number of Siblings: 7 Description of patient's current relationship with siblings: Pt reports he has 52 sisters and 1 brother. Another brother died in an explosion. Did patient suffer any verbal/emotional/physical/sexual abuse as a child?: No Did patient suffer from severe childhood neglect?: No Has patient ever been sexually abused/assaulted/raped as an adolescent or adult?: No Was the patient ever a victim of a crime or a disaster?: No Witnessed domestic violence?: No Has patient been affected by domestic violence as an adult?: No  Child/Adolescent Assessment:     CCA Substance Use Alcohol/Drug Use: Alcohol / Drug Use Pain Medications: Denies abuse Prescriptions: Denies abuse Over the Counter: Denies abuse History of alcohol / drug use?: Yes Longest period of sobriety (when/how long): Unknown Negative Consequences of Use: Financial,Legal,Personal relationships,Work / School Withdrawal Symptoms:  (Pt denies) Substance #1 Name of Substance 1: Alcohol 1 - Age of First Use: unknown 1 - Amount (size/oz): "1 beer" 1 - Frequency: daily 1 - Duration: ongoing 1 - Last Use / Amount: 02/05/2021 1 - Method of Aquiring: Buys at store 1- Route of Use: Oral                       ASAM's:  Six Dimensions of Multidimensional Assessment  Dimension 1:  Acute Intoxication and/or Withdrawal Potential:   Dimension 1:  Description of individual's past and current experiences of substance use and withdrawal: Pt has a history of daily drinking. He denies other substance use  Dimension 2:  Biomedical Conditions and Complications:   Dimension 2:  Description of patient's biomedical conditions and  complications: Pt has  uncontrolled diabetes  Dimension 3:  Emotional, Behavioral, or Cognitive Conditions and Complications:  Dimension 3:  Description of emotional, behavioral, or cognitive conditions and complications: Pt diagnosed with dementia  Dimension 4:  Readiness to Change:  Dimension 4:  Description of Readiness to Change criteria: Pt not interested in stopping his alcohol use  Dimension 5:  Relapse, Continued use, or Continued Problem Potential:  Dimension 5:  Relapse, continued use, or continued problem potential critiera description: Pt does not believe he has a problem with alcohol  Dimension 6:  Recovery/Living Environment:  Dimension 6:  Recovery/Iiving environment criteria description: Pt lives with wife who does not drink alcohol  ASAM Severity Score: ASAM's Severity Rating Score: 15  ASAM Recommended Level  of Treatment: ASAM Recommended Level of Treatment: Level III Residential Treatment   Substance use Disorder (SUD) Substance Use Disorder (SUD)  Checklist Symptoms of Substance Use: Continued use despite having a persistent/recurrent physical/psychological problem caused/exacerbated by use,Continued use despite persistent or recurrent social, interpersonal problems, caused or exacerbated by use,Evidence of tolerance,Presence of craving or strong urge to use,Social, occupational, recreational activities given up or reduced due to use,Repeated use in physically hazardous situations,Substance(s) often taken in larger amounts or over longer times than was intended  Recommendations for Services/Supports/Treatments: Recommendations for Services/Supports/Treatments Recommendations For Services/Supports/Treatments: Inpatient Hospitalization  DSM5 Diagnoses: Patient Active Problem List   Diagnosis Date Noted  . Type II diabetes mellitus, uncontrolled ([place]) 02/13/2020  . Dementia associated with alcoholism ([place]) 02/13/2020  . Complex partial epileptic seizure ([place]) 02/13/2020  . Insomnia disorder related  to known organic factor 02/13/2020  . Alzheimer's dementia with behavioral disturbance ([place])   . Essential hypertension   . Hyperlipidemia   . Type 2 diabetes mellitus with stage 3a chronic kidney disease, with long-term current use of insulin ([place])   . Alcohol abuse   . Seizures ([place]) 07/05/2019  . Anemia due to blood loss, chronic 05/27/2018  . Recurrent hematuria 05/27/2018  . History of prostate cancer 05/27/2018  . Acute urinary retention 05/10/2018  . Memory disorder 12/13/2017  . AKI (acute kidney injury) ([place]) 02/16/2017  . Hyponatremia 02/16/2017  . Hypokalemia 02/15/2017  . ED (erectile dysfunction) of organic origin 11/11/2016  . Malignant neoplasm of prostate ([place]) 11/01/2014  . Syncope and collapse 12/07/2012  . Hyperthyroidism 12/07/2012  . Other malaise and fatigue 12/07/2012  . Disturbance of skin sensation 12/07/2012  . Cervical spondylosis without myelopathy 12/07/2012  . Abnormality of gait 12/07/2012  . Cerebellar ataxia in diseases classified elsewhere [facility]) 12/07/2012    Patient Centered Plan: Patient is on the following Treatment Plan(s):  Impulse Control and Substance Abuse   Referrals to Alternative Service(s): Referred to Alternative Service(s):   Place:   Date:   Time:    Referred to Alternative Service(s):   Place:   Date:   Time:    Referred to Alternative Service(s):   Place:   Date:   Time:    Referred to Alternative Service(s):   Place:   Date:   Time:     Evelena Peat, [facility]

## 2021-02-06 NOTE — ED Notes (Signed)
Pt off unit by EMS for transport home

## 2021-02-06 NOTE — ED Notes (Signed)
Dr. Gilford Raid starting process to rescind IVC paperwork as pt has been psych cleared

## 2021-02-06 NOTE — ED Notes (Signed)
Jeffery [name], SW, on unit-reports no answer from spouse after multiple attempts  [facility] on unit to transport pt home

## 2021-02-06 NOTE — ED Notes (Signed)
Pt incontinent of urine, unable to obtain urine specimen at this time

## 2021-05-24 ENCOUNTER — Ambulatory Visit
Admission: EM | Admit: 2021-05-24 | Discharge: 2021-05-24 | Disposition: A | Discharge status: Home / Self Care | Payer: Medicare Other | Attending: Emergency Medicine | Admitting: Emergency Medicine

## 2021-05-24 ENCOUNTER — Other Ambulatory Visit: Payer: Self-pay

## 2021-05-24 DIAGNOSIS — J069 Acute upper respiratory infection, unspecified: Secondary | ICD-10-CM

## 2021-05-24 DIAGNOSIS — Z20822 Contact with and (suspected) exposure to covid-19: Secondary | ICD-10-CM | POA: Diagnosis not present

## 2021-05-24 DIAGNOSIS — U071 COVID-19: Secondary | ICD-10-CM

## 2021-05-24 MED ORDER — FLUTICASONE PROPIONATE 50 MCG/ACT NA SUSP: 2.0000 | Freq: Every day | NASAL | 0 refills | Status: DC

## 2021-05-24 MED ORDER — BENZONATATE 200 MG PO CAPS: 200.0000 mg | ORAL_CAPSULE | Freq: Three times a day (TID) | ORAL | 0 refills | Status: DC | PRN

## 2021-05-24 NOTE — Discharge Instructions (Addendum)
COVID test will be back in 24 to 48 hours.  I will call in Paxlovid the [facility] in [place], we can get it for free if his COVID is positive.  In the meantime, saline nasal irrigation with a NeilMed sinus rinse and distilled water as often as you want.  Follow the directions on the box.  Flonase for the nasal congestion postnasal drip, Mucinex and Tessalon for the cough.

## 2021-05-24 NOTE — ED Provider Notes (Addendum)
HPI  SUBJECTIVE:  Jeffery [name] is a 70 y.o. male who presents with chest congestion, cough, nasal congestion, rhinorrhea, postnasal drip starting yesterday.  No fevers, chills, sinus pain or pressure, upper dental pain, facial swelling, wheezing, chest pain, shortness of breath, dyspnea on exertion, sore throat, body aches, headaches, loss of sense of smell or taste, nausea, vomiting, diarrhea, abdominal pain.  He is having difficulty sleeping at night secondary to the cough.  No allergy or GERD symptoms.  He got the COVID booster.  No known COVID exposure.  No antibiotics in the past 3 months.  No antipyretic in the past 6 hours.  He has tried cough drops and Alka-Seltzer cold plus with improvement in his symptoms.  No aggravating factors.  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  He quit smoking 40 years ago.  No history of chronic kidney disease, pulmonary disease.  LZJ:QBHAL, Carloyn Manner, MD   Past Medical History:  Diagnosis Date   Cancer [facility])    prostate   Cervical spondylosis    Dementia associated with alcoholism ([place])    from pt's neurology notes   Diabetes mellitus, type II ([place])    Glaucoma    Hematuria    radiation cystitis   History of migraine headaches    Hx of arthroscopic knee surgery    Left knee   Hyperlipidemia    Hypertension    Hypertension    Hypertension    Impaired fasting glucose    Memory loss    Progressive gait disorder    Renal disorder    kidney stone   Varicose veins     Past Surgical History:  Procedure Laterality Date   CHOLECYSTECTOMY     EYE SURGERY     PROSTATE SURGERY      Family History  Problem Relation Age of Onset   Glaucoma Mother    Arthritis Father    Diabetes Father    Fibromyalgia Sister    Gait disorder Maternal Uncle        progessive gait disorder    Social History   Tobacco Use   Smoking status: Former    Pack years: 0.00   Smokeless tobacco:  Never  Vaping Use   Vaping Use: Never used  Substance Use Topics   Alcohol use: Yes    Comment: 0.5 pint of wine daily   Drug use: No    No current facility-administered medications for this encounter.  Current Outpatient Medications:    benzonatate (TESSALON) 200 MG capsule, Take 1 capsule (200 mg total) by mouth 3 (three) times daily as needed for cough., Disp: 30 capsule, Rfl: 0   fluticasone (FLONASE) 50 MCG/ACT nasal spray, Place 2 sprays into both nostrils daily., Disp: 16 g, Rfl: 0   aspirin EC 81 MG EC tablet, Take 1 tablet (81 mg total) by mouth daily., Disp: 30 tablet, Rfl: 2   donepezil (ARICEPT) 10 MG tablet, Take one tablet at bedtime.  Please call [phone] to schedule an appt. (Patient taking differently: No sig reported), Disp: 90 tablet, Rfl: 1   glimepiride (AMARYL) 2 MG tablet, Take 2 mg by mouth every morning., Disp: , Rfl:    KLOR-CON M20 20 MEQ tablet, Take 20 mEq by mouth daily. , Disp: , Rfl:    LEVEMIR FLEXTOUCH 100 UNIT/ML Pen, INJECT 70 UNITS INTO THE SKIN DAILY WITH BREAKFAST., Disp: 30 mL, Rfl: 0   levETIRAcetam (KEPPRA) 1000 MG tablet, Take 1  tablet (1,000 mg total) by mouth 2 (two) times daily., Disp: 60 tablet, Rfl: 1   memantine (NAMENDA) 10 MG tablet, TAKE 1 TABLET BY MOUTH TWICE A DAY (Patient taking differently: Take 10 mg by mouth 2 (two) times daily.), Disp: 180 tablet, Rfl: 0   Multiple Vitamin (MULTIVITAMIN WITH MINERALS) TABS tablet, Take 1 tablet by mouth daily. (Patient not taking: No sig reported), Disp: 30 tablet, Rfl: 1   simvastatin (ZOCOR) 20 MG tablet, Take 20 mg by mouth at bedtime., Disp: , Rfl:    vitamin B-12 1000 MCG tablet, Take 1 tablet (1,000 mcg total) by mouth daily. (Patient not taking: No sig reported), Disp: 30 tablet, Rfl: 1  Allergies  Allergen Reactions   Penicillins Hives, Nausea And Vomiting and Swelling    Swelling of lips Has patient had a PCN reaction causing immediate rash, facial/tongue/throat swelling, SOB or  lightheadedness with hypotension: Yes Has patient had a PCN reaction causing severe rash involving mucus membranes or skin necrosis: No Has patient had a PCN reaction that required hospitalization No Has patient had a PCN reaction occurring within the last 10 years: No If all of the above answers are "NO", then may proceed with Cephalosporin use.    Amlodipine Swelling    Caused swelling in feet and legs   Atenolol Swelling and Other (See Comments)    Angioedema    Clonidine Derivatives Swelling and Other (See Comments)    Angioedema    Metformin And Related     Irritable    Tape Rash    No plastic, "patterned" tape!!     ROS  As noted in HPI.   Physical Exam  BP 135/85 (BP Location: Right Arm)   Pulse 76   Temp 98.4 F (36.9 C) (Oral)   Resp 18   SpO2 95%   Constitutional: Well developed, well nourished, no acute distress Eyes:  EOMI, conjunctiva normal bilaterally HENT: Normocephalic, atraumatic,mucus membranes moist.  Positive mucoid nasal congestion.  Erythematous, swollen turbinates.  No maxillary, frontal sinus tenderness.  Normal oropharynx.  Positive cobblestoning and postnasal drip. Respiratory: Normal inspiratory effort, lungs clear bilaterally.Good air movement  Cardiovascular: Normal rate, regular rhythm, no murmurs rubs or Gallops.   GI: nondistended skin: No rash, skin intact Musculoskeletal: no deformities Neurologic: Alert & oriented x 3, no focal neuro deficits Psychiatric: Speech and behavior appropriate   ED Course   Medications - No data to display  Orders Placed This Encounter  Procedures   Novel Coronavirus, NAA ([facility])    Standing Status:   Standing    Number of Occurrences:   1   SARS-COV-2, NAA 2 DAY TAT    Standing Status:   Standing    Number of Occurrences:   1   Results for orders placed or performed during the hospital encounter of 05/24/21  Novel Coronavirus, NAA ([facility])   Specimen: Nasopharyngeal(NP) swabs in vial  transport medium   Nasopharynge  Result Value Ref Range   SARS-CoV-2, NAA Detected (A) Not Detected  SARS-COV-2, NAA 2 DAY TAT   Nasopharynge  Result Value Ref Range   SARS-CoV-2, NAA 2 DAY TAT Performed      No results found for this or any previous visit (from the past 24 hour(s)). No results found.  ED Clinical Impression  1. COVID-19 virus infection   2. Upper respiratory tract infection, unspecified type   3. Encounter for laboratory testing for COVID-19 virus      ED Assessment/Plan  Estimated GFR 41 from  lab work on 02/05/2021  Patient with a URI.  Will check for COVID.  He has multiple comorbidities and will be a candidate for Paxlovid if his COVID is positive.  In the meantime, saline nasal irrigation, Flonase, Mucinex, Tessalon.  Follow-up with PMD as needed.  To the ER if he gets worse.  COVID pending at the time of signing of this note  Addendum: COVID-positive.  Will E prescribe renally dosed Paxlovid to pharmacy on record.  discontinue simvastatin, Flonase and Aricept while taking the drug and he needs to pay extra attention to his blood sugars.  May restart these medications when he finishes the paxlovid. See telephone note.  Addendum 1417- attempted to reach pt through phone number listed in chart, cell phone mailbox full. Left voicemail on alternate number to have him call [facility] RN. Also attempted to reach spouse and left voicemail.   Discussed MDM, treatment plan, and plan for follow-up with patient and family member.  They agree with plan.   Meds ordered this encounter  Medications   fluticasone (FLONASE) 50 MCG/ACT nasal spray    Sig: Place 2 sprays into both nostrils daily.    Dispense:  16 g    Refill:  0   benzonatate (TESSALON) 200 MG capsule    Sig: Take 1 capsule (200 mg total) by mouth 3 (three) times daily as needed for cough.    Dispense:  30 capsule    Refill:  0      *This clinic note was created using Lobby[employer].  Therefore, there may be occasional mistakes despite careful proofreading.  ?    Melynda Ripple, MD 05/25/21 [id number]    Melynda Ripple, MD 05/26/21 3893    Melynda Ripple, MD 05/26/21 1422

## 2021-05-24 NOTE — ED Triage Notes (Signed)
Patient presents to Urgent Care with complaints of cough since yesterday. Treating with alka-seltzer.    Denies fever.

## 2021-05-25 LAB — NOVEL CORONAVIRUS, NAA: SARS-CoV-2, NAA: DETECTED — AB

## 2021-05-25 LAB — SARS-COV-2, NAA 2 DAY TAT

## 2021-05-26 ENCOUNTER — Telehealth: Payer: Self-pay | Admitting: Emergency Medicine

## 2021-05-26 DIAGNOSIS — U071 COVID-19: Secondary | ICD-10-CM

## 2021-05-26 MED ORDER — NIRMATRELVIR/RITONAVIR (PAXLOVID) TABLET (RENAL DOSING): 2.0000 | ORAL_TABLET | Freq: Two times a day (BID) | ORAL | 0 refills | Status: AC

## 2021-05-26 NOTE — Telephone Encounter (Signed)
COVID-positive.  Will E prescribe renally dosed Paxlovid (150/100 po bid x 5 d)to pharmacy on record.  discontinue simvastatin, Flonase and Aricept while taking the drug and he needs to pay extra attention to his blood sugars.  We will have staff notify patient of positive results and prescription awaiting him.

## 2021-07-06 ENCOUNTER — Ambulatory Visit
Admission: EM | Admit: 2021-07-06 | Discharge: 2021-07-06 | Disposition: A | Discharge status: Home / Self Care | Payer: Medicare Other | Attending: Family Medicine | Admitting: Family Medicine

## 2021-07-06 ENCOUNTER — Encounter: Payer: Self-pay | Admitting: Emergency Medicine

## 2021-07-06 DIAGNOSIS — B3749 Other urogenital candidiasis: Secondary | ICD-10-CM

## 2021-07-06 MED ORDER — FLUCONAZOLE 200 MG PO TABS: 200.0000 mg | ORAL_TABLET | Freq: Every day | ORAL | 0 refills | Status: AC

## 2021-07-06 MED ORDER — DOXYCYCLINE HYCLATE 100 MG PO CAPS: 100.0000 mg | ORAL_CAPSULE | Freq: Two times a day (BID) | ORAL | 0 refills | Status: AC

## 2021-07-06 MED ORDER — CLOTRIMAZOLE 1 % EX CREA: TOPICAL_CREAM | CUTANEOUS | 0 refills | Status: DC

## 2021-07-06 NOTE — ED Triage Notes (Signed)
Rash in groin area that itches and burns x 4 weeks.  States he has been using various creams, with no relief.

## 2021-07-06 NOTE — Discharge Instructions (Addendum)
Keep area dry as possible to reduce moisture. Take both the antifungal and antibiotic together Apply cream twice daily until rash resolve

## 2022-07-13 ENCOUNTER — Emergency Department (HOSPITAL_COMMUNITY)
Admission: EM | Admit: 2022-07-13 | Discharge: 2022-07-13 | Disposition: A | Discharge status: Home / Self Care | Payer: Medicare Other | Attending: Emergency Medicine | Admitting: Emergency Medicine

## 2022-07-13 ENCOUNTER — Other Ambulatory Visit: Payer: Self-pay

## 2022-07-13 ENCOUNTER — Emergency Department (HOSPITAL_COMMUNITY): Payer: Medicare Other

## 2022-07-13 ENCOUNTER — Encounter (HOSPITAL_COMMUNITY): Payer: Self-pay

## 2022-07-13 DIAGNOSIS — Z7982 Long term (current) use of aspirin: Secondary | ICD-10-CM | POA: Diagnosis not present

## 2022-07-13 DIAGNOSIS — R7309 Other abnormal glucose: Secondary | ICD-10-CM | POA: Diagnosis not present

## 2022-07-13 DIAGNOSIS — N390 Urinary tract infection, site not specified: Secondary | ICD-10-CM | POA: Insufficient documentation

## 2022-07-13 DIAGNOSIS — I498 Other specified cardiac arrhythmias: Secondary | ICD-10-CM | POA: Insufficient documentation

## 2022-07-13 DIAGNOSIS — R5383 Other fatigue: Secondary | ICD-10-CM

## 2022-07-13 LAB — BASIC METABOLIC PANEL
Anion gap: 7 (ref 5–15)
BUN: 20 mg/dL (ref 8–23)
CO2: 29 mmol/L (ref 22–32)
Calcium: 9.5 mg/dL (ref 8.9–10.3)
Chloride: 106 mmol/L (ref 98–111)
Creatinine, Ser: 1.53 mg/dL — ABNORMAL HIGH (ref 0.61–1.24)
GFR, Estimated: 48 mL/min — ABNORMAL LOW (ref >60)
Glucose, Bld: 337 mg/dL — ABNORMAL HIGH (ref 70–99)
Potassium: 4.3 mmol/L (ref 3.5–5.1)
Sodium: 142 mmol/L (ref 135–145)

## 2022-07-13 LAB — URINALYSIS, ROUTINE W REFLEX MICROSCOPIC
Bilirubin Urine: NEGATIVE
Glucose, UA: >=500 mg/dL — AB
Hgb urine dipstick: NEGATIVE
Ketones, ur: NEGATIVE mg/dL
Nitrite: POSITIVE — AB
Protein, ur: NEGATIVE mg/dL
Specific Gravity, Urine: 1.013 (ref 1.005–1.030)
pH: 6 (ref 5.0–8.0)

## 2022-07-13 LAB — CBC
HCT: 46.5 % (ref 39.0–52.0)
Hemoglobin: 15.1 g/dL (ref 13.0–17.0)
MCH: 31.7 pg (ref 26.0–34.0)
MCHC: 32.5 g/dL (ref 30.0–36.0)
MCV: 97.5 fL (ref 80.0–100.0)
Platelets: 114 10*3/uL — ABNORMAL LOW (ref 150–400)
RBC: 4.77 MIL/uL (ref 4.22–5.81)
RDW: 12.8 % (ref 11.5–15.5)
WBC: 5.4 10*3/uL (ref 4.0–10.5)
nRBC: 0 % (ref 0.0–0.2)

## 2022-07-13 MED ORDER — SULFAMETHOXAZOLE-TRIMETHOPRIM 800-160 MG PO TABS: 1.0000 | ORAL_TABLET | Freq: Two times a day (BID) | ORAL | 0 refills | Status: AC

## 2022-07-13 MED ORDER — SULFAMETHOXAZOLE-TRIMETHOPRIM 800-160 MG PO TABS
1.0000 | ORAL_TABLET | Freq: Once | ORAL | Status: AC
  Administered 2022-07-13: 1 via ORAL
  Filled 2022-07-13: qty 1

## 2022-07-13 NOTE — ED Triage Notes (Signed)
Patient's wife states that she was called by the [facility] staff for patient having irregular heart rate and low oxygen levels. Denies SHOB. Wife states that patient has been sleeping a lot.

## 2022-07-13 NOTE — ED Notes (Signed)
ED Provider at bedside. 

## 2022-07-13 NOTE — ED Provider Notes (Signed)
[facility] EMERGENCY DEPARTMENT Provider Note   CSN: [id number] Arrival date & time: 07/13/22  1225     History {Add pertinent medical, surgical, social history, OB history to HPI:1} Chief Complaint  Patient presents with   Irregular Heart Beat    Jeffery [name] is a 71 y.o. male.  HPI Patient here today for evaluation of increasing lethargy over the last 3 days, characterized by tiredness and falling to sleep unusual times.  He lives with his wife, and during the day he has been going to adult daycare.  At the daycare he has been sleepier than usual.  They also noticed that his heart rate was irregular so encouraged the patient to be evaluated.  He has not had any fever, chills, cough, dizziness, change in bowel or urinary habits or symptoms.  He  gets up to urinate at night, once or twice, which is not unusual for him.  He is taking his regular medicines.    Home Medications Prior to Admission medications   Medication Sig Start Date End Date Taking? Authorizing Provider  aspirin EC 81 MG EC tablet Take 1 tablet (81 mg total) by mouth daily. 07/09/19   Barton Dubois, MD  benzonatate (TESSALON) 200 MG capsule Take 1 capsule (200 mg total) by mouth 3 (three) times daily as needed for cough. 05/24/21   Melynda Ripple, MD  clotrimazole (LOTRIMIN) 1 % cream Apply to affected area 2 times daily 07/06/21   Scot Jun, FNP  donepezil (ARICEPT) 10 MG tablet Take one tablet at bedtime.  Please call [phone] to schedule an appt. Patient taking differently: No sig reported 06/19/19   Suzzanne Cloud, NP  fluticasone [facility]) 50 MCG/ACT nasal spray Place 2 sprays into both nostrils daily. 05/24/21   Melynda Ripple, MD  glimepiride (AMARYL) 2 MG tablet Take 2 mg by mouth every morning. 04/16/19   [provider]  KLOR-CON M20 20 MEQ tablet Take 20 mEq by mouth daily.  02/18/19   [provider]  LEVEMIR FLEXTOUCH 100 UNIT/ML Pen INJECT 70 UNITS INTO THE SKIN DAILY WITH  BREAKFAST. 01/29/20   Cassandria Anger, MD  levETIRAcetam (KEPPRA) 1000 MG tablet Take 1 tablet (1,000 mg total) by mouth 2 (two) times daily. 07/08/19   Barton Dubois, MD  memantine (NAMENDA) 10 MG tablet TAKE 1 TABLET BY MOUTH TWICE A DAY Patient taking differently: Take 10 mg by mouth 2 (two) times daily. 06/05/20   Suzzanne Cloud, NP  Multiple Vitamin (MULTIVITAMIN WITH MINERALS) TABS tablet Take 1 tablet by mouth daily. Patient not taking: No sig reported 07/09/19   Barton Dubois, MD  vitamin B-12 1000 MCG tablet Take 1 tablet (1,000 mcg total) by mouth daily. Patient not taking: No sig reported 07/09/19   Barton Dubois, MD      Allergies    Penicillins, Amlodipine, Atenolol, Clonidine derivatives, Metformin and related, and Tape    Review of Systems   Review of Systems  Physical Exam Updated Vital Signs BP (!) 150/84   Pulse (!) 48   Temp (!) 97.5 F (36.4 C) (Oral)   Resp 17   Ht '6\' 2"'$  (1.88 m)   Wt 100.7 kg   SpO2 100%   BMI 28.50 kg/m  Physical Exam Vitals and nursing note reviewed.  Constitutional:      Appearance: He is well-developed. He is not ill-appearing, toxic-appearing or diaphoretic.  HENT:     Head: Normocephalic and atraumatic.     Right Ear: External ear  normal.     Left Ear: External ear normal.     Nose: No congestion.  Eyes:     Conjunctiva/sclera: Conjunctivae normal.     Pupils: Pupils are equal, round, and reactive to light.  Neck:     Trachea: Phonation normal.  Cardiovascular:     Rate and Rhythm: Normal rate and regular rhythm.     Heart sounds: Normal heart sounds.  Pulmonary:     Effort: Pulmonary effort is normal.     Breath sounds: Normal breath sounds.  Abdominal:     General: There is no distension.     Palpations: Abdomen is soft.     Tenderness: There is no abdominal tenderness.  Musculoskeletal:        General: Normal range of motion.     Cervical back: Normal range of motion and neck supple.  Skin:    General: Skin is  warm and dry.     Coloration: Skin is not jaundiced or pale.  Neurological:     Mental Status: He is alert.     Cranial Nerves: No cranial nerve deficit.     Sensory: No sensory deficit.     Motor: No abnormal muscle tone.     Coordination: Coordination normal.     Comments: No dysarthria or aphasia.  He is confused.  Psychiatric:        Mood and Affect: Mood normal.        Behavior: Behavior normal.     ED Results / Procedures / Treatments   Labs (all labs ordered are listed, but only abnormal results are displayed) Labs Reviewed  BASIC METABOLIC PANEL  CBC    EKG None  Radiology DG Chest 2 View  Result Date: 07/13/2022 CLINICAL DATA:  irregular heart rate and low oxygen levels per care facility/diabetic/htn/non smoker/hx prostate ca EXAM: CHEST - 2 VIEW COMPARISON:  04/17/2018 FINDINGS: Mild prominence of the right contour of the ascending thoracic aorta, probably from tortuosity, although ascending thoracic aortic aneurysm can have a similar appearance. Heart size within normal limits. The lungs appear clear. No blunting of the costophrenic angles. Mild thoracic spondylosis. IMPRESSION: 1. Mild prominence the right contour of the ascending thoracic aorta, probably from tortuosity, less likely from thoracic aortic aneurysm. This could be further assessed with chest CT if clinically warranted. Electronically Signed   By: Van Clines M.D.   On: 07/13/2022 13:20    Procedures Procedures  {Document cardiac monitor, telemetry assessment procedure when appropriate:1}  Medications Ordered in ED Medications - No data to display  ED Course/ Medical Decision Making/ A&P                           Medical Decision Making Elderly male with history of dementia, presenting for ongoing and worsening lethargy.  No outward symptoms of illness.  Differential diagnosis includes worsening dementia, medication complications, and acute illness.  Patient will be evaluated for infectious  processes and metabolic disorders.  No clinical evidence for stroke syndrome on arrival.  He was reported to have low oxygen level, but arrived here with oxygen saturations level at 100% on room air.  Amount and/or Complexity of Data Reviewed Labs: ordered. Radiology: ordered. ECG/medicine tests: ordered and independent interpretation performed.    Details: Cardiac monitor -- sinus rhythm with sinus arrhythmia     {Document critical care time when appropriate:1} {Document review of labs and clinical decision tools ie heart score, Chads2Vasc2 etc:1}  {Document your  independent review of radiology images, and any outside records:1} {Document your discussion with family members, caretakers, and with consultants:1} {Document social determinants of health affecting pt's care:1} {Document your decision making why or why not admission, treatments were needed:1} Final Clinical Impression(s) / ED Diagnoses Final diagnoses:  None    Rx / DC Orders ED Discharge Orders     None

## 2022-07-13 NOTE — Discharge Instructions (Signed)
Follow-up with your family doctor next week to recheck his urinary tract infection.  Also you been referred to Dr. Harl Bowie in cardiology and you can follow-up with him or any of his colleagues for his slow heart rate

## 2022-07-17 LAB — URINE CULTURE: Culture: >=100000 — AB

## 2022-07-18 ENCOUNTER — Telehealth (HOSPITAL_BASED_OUTPATIENT_CLINIC_OR_DEPARTMENT_OTHER): Payer: Self-pay | Admitting: *Deleted

## 2022-07-18 NOTE — Progress Notes (Signed)
ED Antimicrobial Stewardship Positive Culture Follow Up   Jeffery [name] is an 71 y.o. male who presented to [facility] on 07/13/2022 with a chief complaint of  Chief Complaint  Patient presents with   Irregular Heart Beat    Recent Results (from the past 720 hour(s))  Urine Culture     Status: Abnormal   Collection Time: 07/13/22  8:10 PM   Specimen: Urine, Clean Catch  Result Value Ref Range Status   Specimen Description   Final    URINE, CLEAN CATCH Performed at [facility], [address] [place], [place] [address]    Special Requests   Final    NONE Performed at [facility], [address] [place], [place] [address]    Culture >=100,000 COLONIES/mL CITROBACTER KOSERI (A)  Final   Report Status 07/17/2022 FINAL  Final    Plan: d/c Bactrim,give fosfomycin 3g PO x 1  ED Provider: Kathe Becton, PA-C   Jeffery [name] 07/18/2022, 11:00 AM Clinical Pharmacist Monday - Friday phone -  [phone] Saturday - Sunday phone - [phone]

## 2022-07-18 NOTE — Telephone Encounter (Signed)
Post ED Visit - Positive Culture Follow-up: Successful Patient Follow-Up  Culture assessed and recommendations reviewed by:  '[]'$  Elenor Quinones, Pharm.D. '[]'$  Heide Guile, Pharm.D., BCPS AQ-ID '[]'$  Parks Neptune, Pharm.D., BCPS '[]'$  Alycia Rossetti, Pharm.D., BCPS '[]'$  Rudyard, Pharm.D., BCPS, AAHIVP '[]'$  Legrand Como, Pharm.D., BCPS, AAHIVP '[]'$  Salome Arnt, PharmD, BCPS '[]'$  Johnnette Gourd, PharmD, BCPS '[]'$  Hughes Better, PharmD, BCPS '[x]'$  Bertis Ruddy, PharmD  Positive urine culture  '[]'$  Patient discharged without antimicrobial prescription and treatment is now indicated '[x]'$  Organism is resistant to prescribed ED discharge antimicrobial '[]'$  Patient with positive blood cultures  Changes discussed with ED provider: Kathe Becton, PA New antibiotic prescription Fosfomycin 3g x 1  Called to [facility] [place] [place]  Contacted patient, date 07/18/22, time 1142   Rosie Fate 07/18/2022, 11:45 AM

## 2022-07-19 ENCOUNTER — Telehealth (HOSPITAL_BASED_OUTPATIENT_CLINIC_OR_DEPARTMENT_OTHER): Payer: Self-pay

## 2022-07-19 NOTE — ED Notes (Signed)
Margot Chimes [phone] )calling in F[facility]omycin 3g x [facility] to [facility] [place] [facility] due to pt stated rx was not there.  07/19/2022 @ 626pm.

## 2022-07-20 NOTE — Telephone Encounter (Signed)
Late Entry from 07/19/22 @ 1825  [facility] ED called stating pts wife & pt pt himself had been calling today regarding Rx for Fosfomycin 3 gram po x1 written by Kathe Becton PA that was called in yesterday by another PP RN and was not at requested [place].  Current writer called [facility] [place] [phone] and spoke to [place] who said he didn't have Fosfomycin Rx for pt.  Rx given to RPh and informed me they will have to order medication.  Medication expected to arrive ~ 5 pm 8/22.  Spoke with pts wife verified pts DOB with her and provided her with information regarding when medication is expected to arrive to store.  PP RN phone number given to wife should there be any more problems.

## 2024-07-30 ENCOUNTER — Emergency Department (HOSPITAL_COMMUNITY)

## 2024-07-30 ENCOUNTER — Encounter (HOSPITAL_COMMUNITY): Payer: Self-pay

## 2024-07-30 ENCOUNTER — Other Ambulatory Visit: Payer: Self-pay

## 2024-07-30 ENCOUNTER — Emergency Department (HOSPITAL_COMMUNITY): Admission: EM | Admit: 2024-07-30 | Discharge: 2024-07-30 | Disposition: A | Discharge status: Home / Self Care

## 2024-07-30 DIAGNOSIS — I10 Essential (primary) hypertension: Secondary | ICD-10-CM | POA: Insufficient documentation

## 2024-07-30 DIAGNOSIS — E119 Type 2 diabetes mellitus without complications: Secondary | ICD-10-CM | POA: Diagnosis not present

## 2024-07-30 DIAGNOSIS — U071 COVID-19: Secondary | ICD-10-CM | POA: Diagnosis not present

## 2024-07-30 DIAGNOSIS — R5383 Other fatigue: Secondary | ICD-10-CM

## 2024-07-30 DIAGNOSIS — E860 Dehydration: Secondary | ICD-10-CM | POA: Insufficient documentation

## 2024-07-30 DIAGNOSIS — R531 Weakness: Secondary | ICD-10-CM | POA: Diagnosis present

## 2024-07-30 DIAGNOSIS — F0390 Unspecified dementia without behavioral disturbance: Secondary | ICD-10-CM | POA: Insufficient documentation

## 2024-07-30 DIAGNOSIS — Z7982 Long term (current) use of aspirin: Secondary | ICD-10-CM | POA: Insufficient documentation

## 2024-07-30 DIAGNOSIS — Z8546 Personal history of malignant neoplasm of prostate: Secondary | ICD-10-CM | POA: Insufficient documentation

## 2024-07-30 LAB — CBG MONITORING, ED: Glucose-Capillary: 252 mg/dL — ABNORMAL HIGH (ref 70–99)

## 2024-07-30 LAB — URINALYSIS, ROUTINE W REFLEX MICROSCOPIC
Bacteria, UA: NONE SEEN
Bilirubin Urine: NEGATIVE
Glucose, UA: >=500 mg/dL — AB
Ketones, ur: 20 mg/dL — AB
Leukocytes,Ua: NEGATIVE
Nitrite: NEGATIVE
Protein, ur: NEGATIVE mg/dL
Specific Gravity, Urine: 1.012 (ref 1.005–1.030)
pH: 5 (ref 5.0–8.0)

## 2024-07-30 LAB — COMPREHENSIVE METABOLIC PANEL WITH GFR
ALT: 29 U/L (ref 0–44)
AST: 37 U/L (ref 15–41)
Albumin: 3.2 g/dL — ABNORMAL LOW (ref 3.5–5.0)
Alkaline Phosphatase: 136 U/L — ABNORMAL HIGH (ref 38–126)
Anion gap: 11 (ref 5–15)
BUN: 20 mg/dL (ref 8–23)
CO2: 27 mmol/L (ref 22–32)
Calcium: 9.3 mg/dL (ref 8.9–10.3)
Chloride: 98 mmol/L (ref 98–111)
Creatinine, Ser: 1.62 mg/dL — ABNORMAL HIGH (ref 0.61–1.24)
GFR, Estimated: 45 mL/min — ABNORMAL LOW (ref >60)
Glucose, Bld: 277 mg/dL — ABNORMAL HIGH (ref 70–99)
Potassium: 4.7 mmol/L (ref 3.5–5.1)
Sodium: 136 mmol/L (ref 135–145)
Total Bilirubin: 2.3 mg/dL — ABNORMAL HIGH (ref 0.0–1.2)
Total Protein: 7.6 g/dL (ref 6.5–8.1)

## 2024-07-30 LAB — CBC
HCT: 46.6 % (ref 39.0–52.0)
Hemoglobin: 15.3 g/dL (ref 13.0–17.0)
MCH: 30.9 pg (ref 26.0–34.0)
MCHC: 32.8 g/dL (ref 30.0–36.0)
MCV: 94.1 fL (ref 80.0–100.0)
Platelets: 122 K/uL — ABNORMAL LOW (ref 150–400)
RBC: 4.95 MIL/uL (ref 4.22–5.81)
RDW: 13.3 % (ref 11.5–15.5)
WBC: 6.9 K/uL (ref 4.0–10.5)
nRBC: 0 % (ref 0.0–0.2)

## 2024-07-30 LAB — RESP PANEL BY RT-PCR (RSV, FLU A&B, COVID)  RVPGX2
Influenza A by PCR: NEGATIVE
Influenza B by PCR: NEGATIVE
Resp Syncytial Virus by PCR: NEGATIVE
SARS Coronavirus 2 by RT PCR: POSITIVE — AB

## 2024-07-30 MED ORDER — LACTATED RINGERS IV BOLUS
500.0000 mL | Freq: Once | INTRAVENOUS | Status: AC
  Administered 2024-07-30: 500 mL via INTRAVENOUS

## 2024-07-30 MED ORDER — ONDANSETRON HCL 4 MG PO TABS: 4.0000 mg | ORAL_TABLET | Freq: Four times a day (QID) | ORAL | 0 refills | Status: DC

## 2024-07-30 MED ORDER — LACTATED RINGERS IV BOLUS: 500.0000 mL | Freq: Once | INTRAVENOUS | Status: DC

## 2024-07-30 NOTE — Discharge Instructions (Addendum)
 Patient was seen today here for being positive with COVID as well as noted to be dehydrated and fatigued.  His lab work today was reassuring despite being positive for COVID and showing some signs of dehydration.  Provided fluids for him.  Recommend that you continue to stay well-hydrated and isolate until symptom-free.  I am also sending home with some nausea medication for you to use as needed.  Please have him return to the ED for any new or worsening symptoms which include persistent vomiting, unrelenting fever, worsening shortness of breath, worsening confusion or one-sided weakness.  Be sure to follow-up with the PCP to ensure that he is having resolution of symptoms in the next week.

## 2024-07-30 NOTE — ED Provider Notes (Signed)
 [place] EMERGENCY DEPARTMENT AT [facility] Provider Note   CSN: [id number] Arrival date & time: 07/30/24  1434     Patient presents with: Weakness COVID positive home test   Jeffery [name] is a 73 y.o. male.  HPI Patient is a 73 year old male presented ED today per EMS noting increased weakness generally x 3 days.  Normally able to ambulate and feed himself however has not been able to do so per family.  Notably has history of dementia, diabetes, prostate cancer, HTN.    Spoke to wife on the phone, who noted that he had been acting progressively weaker and more tired over the course of the last 3 days.  Noted that he has not ambulated today and has also had decreased fluid and food intake.  Reports that he has not been complaining of anything specifically but is at his baseline mentally otherwise.  Notes that she also to has tested positive for COVID.  Per wife, denies fever, cough, shortness of breath, diarrhea, dysuria.  Prior to Admission medications   Medication Sig Start Date End Date Taking? Authorizing Provider  ondansetron  (ZOFRAN ) 4 MG tablet Take 1 tablet (4 mg total) by mouth every 6 (six) hours. 07/30/24  Yes Beola Terrall RAMAN, PA-C  aspirin  EC 81 MG EC tablet Take 1 tablet (81 mg total) by mouth daily. 07/09/19   Ricky Fines, MD  benzonatate  (TESSALON ) 200 MG capsule Take 1 capsule (200 mg total) by mouth 3 (three) times daily as needed for cough. 05/24/21   Van Knee, MD  clotrimazole  (LOTRIMIN ) 1 % cream Apply to affected area 2 times daily 07/06/21   Arloa Suzen RAMAN, NP  donepezil  (ARICEPT ) 10 MG tablet Take one tablet at bedtime.  Please call [phone] to schedule an appt. Patient taking differently: No sig reported 06/19/19   Gayland Lauraine PARAS, NP  fluticasone  (FLONASE ) 50 MCG/ACT nasal spray Place 2 sprays into both nostrils daily. 05/24/21   Van Knee, MD  glimepiride  (AMARYL ) 2 MG tablet Take 2 mg by mouth every morning. 04/16/19   [provider]  KLOR-CON  M20 20 MEQ tablet Take 20 mEq by mouth daily.  02/18/19   [provider]  LEVEMIR  FLEXTOUCH 100 UNIT/ML Pen INJECT 70 UNITS INTO THE SKIN DAILY WITH BREAKFAST. 01/29/20   Nida, Gebreselassie W, MD  levETIRAcetam  (KEPPRA ) 1000 MG tablet Take 1 tablet (1,000 mg total) by mouth 2 (two) times daily. 07/08/19   Ricky Fines, MD  memantine  (NAMENDA ) 10 MG tablet TAKE 1 TABLET BY MOUTH TWICE A DAY Patient taking differently: Take 10 mg by mouth 2 (two) times daily. 06/05/20   Gayland Lauraine PARAS, NP  Multiple Vitamin (MULTIVITAMIN WITH MINERALS) TABS tablet Take 1 tablet by mouth daily. Patient not taking: No sig reported 07/09/19   Ricky Fines, MD  vitamin B-12 1000 MCG tablet Take 1 tablet (1,000 mcg total) by mouth daily. Patient not taking: No sig reported 07/09/19   Ricky Fines, MD    Allergies: Penicillins, Amlodipine , Atenolol, Clonidine derivatives, Metformin  and related, and Tape    Review of Systems  Unable to perform ROS: Dementia    Updated Vital Signs BP 127/76 (BP Location: Left Arm)   Pulse 67   Temp 97.6 F (36.4 C) (Oral)   Resp 20   Ht 6' 2 (1.88 m)   Wt 100.7 kg   SpO2 96%   BMI 28.50 kg/m   Physical Exam Vitals and nursing note reviewed.  Constitutional:  General: He is not in acute distress.    Appearance: Normal appearance. He is not ill-appearing or diaphoretic.  HENT:     Head: Normocephalic and atraumatic.     Nose: Congestion and rhinorrhea present.     Mouth/Throat:     Mouth: Mucous membranes are moist.     Pharynx: Oropharynx is clear. No oropharyngeal exudate or posterior oropharyngeal erythema.  Eyes:     General: No scleral icterus.       Right eye: No discharge.        Left eye: No discharge.     Extraocular Movements: Extraocular movements intact.     Conjunctiva/sclera: Conjunctivae normal.     Pupils: Pupils are equal, round, and reactive to light.  Cardiovascular:     Rate and Rhythm: Normal rate and  regular rhythm.     Pulses: Normal pulses.     Heart sounds: Normal heart sounds. No murmur heard.    No friction rub. No gallop.  Pulmonary:     Effort: Pulmonary effort is normal. No respiratory distress.     Breath sounds: No stridor. No wheezing, rhonchi or rales.  Chest:     Chest wall: No tenderness.  Abdominal:     General: Abdomen is flat. There is no distension.     Palpations: Abdomen is soft.     Tenderness: There is no abdominal tenderness. There is no right CVA tenderness, left CVA tenderness, guarding or rebound.  Musculoskeletal:        General: No swelling, deformity or signs of injury.     Cervical back: Normal range of motion. No rigidity.     Right lower leg: No edema.     Left lower leg: No edema.  Skin:    General: Skin is warm and dry.     Findings: No bruising, erythema or lesion.  Neurological:     General: No focal deficit present.     Mental Status: He is alert. Mental status is at baseline.     Sensory: No sensory deficit.     Motor: No weakness.     Comments: Patient is able to move all extremities grossly however unable to perform neuroexam secondary to dementia.  Psychiatric:        Mood and Affect: Mood normal.     (all labs ordered are listed, but only abnormal results are displayed) Labs Reviewed  RESP PANEL BY RT-PCR (RSV, FLU A&B, COVID)  RVPGX2 - Abnormal; Notable for the following components:      Result Value   SARS Coronavirus 2 by RT PCR POSITIVE (*)    All other components within normal limits  COMPREHENSIVE METABOLIC PANEL WITH GFR - Abnormal; Notable for the following components:   Glucose, Bld 277 (*)    Creatinine, Ser 1.62 (*)    Albumin 3.2 (*)    Alkaline Phosphatase 136 (*)    Total Bilirubin 2.3 (*)    GFR, Estimated 45 (*)    All other components within normal limits  CBC - Abnormal; Notable for the following components:   Platelets 122 (*)    All other components within normal limits  URINALYSIS, ROUTINE W REFLEX  MICROSCOPIC - Abnormal; Notable for the following components:   Glucose, UA >=500 (*)    Hgb urine dipstick SMALL (*)    Ketones, ur 20 (*)    All other components within normal limits  CBG MONITORING, ED - Abnormal; Notable for the following components:   Glucose-Capillary 252 (*)  All other components within normal limits    EKG: None  Radiology: DG Chest 2 View Result Date: 07/30/2024 EXAM: 2 VIEW(S) XRAY OF THE CHEST 07/30/2024 03:38:00 PM COMPARISON: None available. CLINICAL HISTORY: Altered mental status. Pt BIB ems from home. Pt is normally nonverbal but able to walk and feed self. Per ems family called because pt has been weak today, unable to ambulate or feed self. Family took at home COVID test for pt which came back positive. Per EMS pt has hx of dementia. FINDINGS: LUNGS AND PLEURA: No focal pulmonary opacity. No pulmonary edema. No pleural effusion. No pneumothorax. HEART AND MEDIASTINUM: Mildly tortuous thoracic aorta. BONES AND SOFT TISSUES: No acute osseous abnormality. Cholecystectomy clips noted. IMPRESSION: 1. No acute findings. Electronically signed by: Selinda Blue MD 07/30/2024 04:04 PM EDT RP Workstation: [id number]    Procedures   Medications Ordered in the ED  lactated ringers  bolus 500 mL (has no administration in time range)  lactated ringers  bolus 500 mL (500 mLs Intravenous New Bag/Given 07/30/24 1736)                                  Medical Decision Making Amount and/or Complexity of Data Reviewed Labs: ordered. Radiology: ordered. ECG/medicine tests: ordered.  Risk Prescription drug management.   This patient is a 73 year old male who presents to the ED for concern of fatigue, reported by spouse who said that he has had decreased appetite and has not been eating or drinking as he normally does having rhinorrhea and congestion.  She notes that she also tested positive for COVID recently.  He has been ambulatory but today has been more sedentary.  On  physical exam, patient is in no acute distress, afebrile, alert and orient x0 which is his baseline, speaking in full sentences, nontachypneic, nontachycardic.  Patient is noted to have rhinorrhea and congestion present.  Patient is pleasantly confused, answering questions inappropriately which is his baseline.  LCTAB, RRR, no lower leg edema, no abdominal tenderness noted, no chest wall tenderness noted, no CVA tenderness.  Unremarkable exam otherwise.  With patient's current presentation, suspect likely URI as cause of patient's fatigue today.  However broad workup was done, with ketones noted in the urine likely secondary to dehydration.  Provided a liter of fluid.  Patient is tolerating p.o. intake and is drinking appropriately.  Low suspicion for CVA, pneumonia, ACS, abdominal etiology.  Will have him continue to follow-up with PCP for any persistent symptoms and return to the ED for any new or worsening symptoms.  Patient vital signs have remained stable throughout the course of patient's time in the ED. Low suspicion for any other emergent pathology at this time. I believe this patient is safe to be discharged. Provided strict return to ER precautions.  Differential diagnoses prior to evaluation: The emergent differential diagnosis includes, but is not limited to,  URI,  . This is not an exhaustive differential.   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 Pertinent results include: Has not been seen since 2023 per Care Everywhere in chart  Lab Tests/Imaging studies: I personally interpreted labs/imaging and the pertinent results include:    CBC noted that thrombocytopenia which is near baseline for patient but otherwise unremarkable CMP notes a mildly elevated creatinine and decreased GFR similar to previous baseline with an  elevated alk phos likely secondary to dehydration UA notes ketones likely secondary to dehydration Respiratory panel positive for COVID Chest x-ray shows no acute findings  I agree with the radiologist interpretation.    Medications: I ordered medication including LR.  I have reviewed the patients home medicines and have made adjustments as needed.  Critical Interventions: None  Social Determinants of Health: Patient is newly demented, living at home with spouse  Disposition: After consideration of the diagnostic results and the patients response to treatment, I feel that the patient would benefit from discharge intern as above.   emergency department workup does not suggest an emergent condition requiring admission or immediate intervention beyond what has been performed at this time. The plan is: Follow-up with PCP for any persistent symptoms, return to the ED for new or worsening symptoms, continue stay hydrated and isolate.. The patient is safe for discharge and has been instructed to return immediately for worsening symptoms, change in symptoms or any other concerns.   Final diagnoses:  COVID  Dehydration  Fatigue, unspecified type    ED Discharge Orders          Ordered    ondansetron  (ZOFRAN ) 4 MG tablet  Every 6 hours        07/30/24 1853               Beola Terrall RAMAN, PA-C 07/30/24 2331    Simon Lavonia SAILOR, MD 07/31/24 [phone]

## 2024-07-30 NOTE — ED Triage Notes (Addendum)
 Pt BIB ems from home. Pt is normally nonverbal but able to walk and feed self. Per ems family called because pt has been weak today, unable to ambulate or feed self. Family took at home COVID test for pt which came back positive. Per EMS pt has hx of dementia.

## 2024-09-24 ENCOUNTER — Encounter (INDEPENDENT_AMBULATORY_CARE_PROVIDER_SITE_OTHER): Payer: Self-pay | Admitting: *Deleted

## 2024-10-05 ENCOUNTER — Inpatient Hospital Stay (HOSPITAL_COMMUNITY)
Admission: EM | Admit: 2024-10-05 | Discharge: 2024-10-17 | DRG: 870 | Disposition: A | Discharge status: Skilled Nursing Facility | Source: Skilled Nursing Facility | Attending: Emergency Medicine | Admitting: Emergency Medicine

## 2024-10-05 ENCOUNTER — Emergency Department (HOSPITAL_COMMUNITY)

## 2024-10-05 ENCOUNTER — Encounter (HOSPITAL_COMMUNITY): Payer: Self-pay

## 2024-10-05 ENCOUNTER — Other Ambulatory Visit: Payer: Self-pay

## 2024-10-05 DIAGNOSIS — T68XXXA Hypothermia, initial encounter: Secondary | ICD-10-CM | POA: Diagnosis not present

## 2024-10-05 DIAGNOSIS — E875 Hyperkalemia: Secondary | ICD-10-CM | POA: Diagnosis present

## 2024-10-05 DIAGNOSIS — I10 Essential (primary) hypertension: Secondary | ICD-10-CM | POA: Diagnosis not present

## 2024-10-05 DIAGNOSIS — Z880 Allergy status to penicillin: Secondary | ICD-10-CM

## 2024-10-05 DIAGNOSIS — E440 Moderate protein-calorie malnutrition: Secondary | ICD-10-CM | POA: Diagnosis present

## 2024-10-05 DIAGNOSIS — G9341 Metabolic encephalopathy: Secondary | ICD-10-CM | POA: Diagnosis present

## 2024-10-05 DIAGNOSIS — Z9181 History of falling: Secondary | ICD-10-CM

## 2024-10-05 DIAGNOSIS — J9601 Acute respiratory failure with hypoxia: Secondary | ICD-10-CM | POA: Diagnosis present

## 2024-10-05 DIAGNOSIS — Z8546 Personal history of malignant neoplasm of prostate: Secondary | ICD-10-CM

## 2024-10-05 DIAGNOSIS — Z515 Encounter for palliative care: Secondary | ICD-10-CM

## 2024-10-05 DIAGNOSIS — E871 Hypo-osmolality and hyponatremia: Secondary | ICD-10-CM | POA: Diagnosis present

## 2024-10-05 DIAGNOSIS — I129 Hypertensive chronic kidney disease with stage 1 through stage 4 chronic kidney disease, or unspecified chronic kidney disease: Secondary | ICD-10-CM | POA: Diagnosis present

## 2024-10-05 DIAGNOSIS — Z87891 Personal history of nicotine dependence: Secondary | ICD-10-CM

## 2024-10-05 DIAGNOSIS — E1122 Type 2 diabetes mellitus with diabetic chronic kidney disease: Secondary | ICD-10-CM | POA: Diagnosis present

## 2024-10-05 DIAGNOSIS — Z8619 Personal history of other infectious and parasitic diseases: Secondary | ICD-10-CM

## 2024-10-05 DIAGNOSIS — J690 Pneumonitis due to inhalation of food and vomit: Secondary | ICD-10-CM | POA: Diagnosis present

## 2024-10-05 DIAGNOSIS — Z7982 Long term (current) use of aspirin: Secondary | ICD-10-CM

## 2024-10-05 DIAGNOSIS — N179 Acute kidney failure, unspecified: Secondary | ICD-10-CM | POA: Diagnosis present

## 2024-10-05 DIAGNOSIS — N390 Urinary tract infection, site not specified: Secondary | ICD-10-CM | POA: Diagnosis present

## 2024-10-05 DIAGNOSIS — J9811 Atelectasis: Secondary | ICD-10-CM | POA: Diagnosis present

## 2024-10-05 DIAGNOSIS — B964 Proteus (mirabilis) (morganii) as the cause of diseases classified elsewhere: Secondary | ICD-10-CM | POA: Diagnosis present

## 2024-10-05 DIAGNOSIS — Y848 Other medical procedures as the cause of abnormal reaction of the patient, or of later complication, without mention of misadventure at the time of the procedure: Secondary | ICD-10-CM | POA: Diagnosis present

## 2024-10-05 DIAGNOSIS — F03918 Unspecified dementia, unspecified severity, with other behavioral disturbance: Secondary | ICD-10-CM | POA: Diagnosis not present

## 2024-10-05 DIAGNOSIS — D696 Thrombocytopenia, unspecified: Secondary | ICD-10-CM | POA: Diagnosis present

## 2024-10-05 DIAGNOSIS — J95851 Ventilator associated pneumonia: Secondary | ICD-10-CM | POA: Diagnosis present

## 2024-10-05 DIAGNOSIS — R578 Other shock: Secondary | ICD-10-CM | POA: Diagnosis not present

## 2024-10-05 DIAGNOSIS — N1831 Chronic kidney disease, stage 3a: Secondary | ICD-10-CM | POA: Diagnosis present

## 2024-10-05 DIAGNOSIS — I2489 Other forms of acute ischemic heart disease: Secondary | ICD-10-CM | POA: Diagnosis present

## 2024-10-05 DIAGNOSIS — N136 Pyonephrosis: Secondary | ICD-10-CM | POA: Diagnosis not present

## 2024-10-05 DIAGNOSIS — E874 Mixed disorder of acid-base balance: Secondary | ICD-10-CM | POA: Diagnosis present

## 2024-10-05 DIAGNOSIS — Z888 Allergy status to other drugs, medicaments and biological substances status: Secondary | ICD-10-CM

## 2024-10-05 DIAGNOSIS — Z794 Long term (current) use of insulin: Secondary | ICD-10-CM | POA: Diagnosis not present

## 2024-10-05 DIAGNOSIS — F0390 Unspecified dementia without behavioral disturbance: Secondary | ICD-10-CM | POA: Diagnosis not present

## 2024-10-05 DIAGNOSIS — N16 Renal tubulo-interstitial disorders in diseases classified elsewhere: Secondary | ICD-10-CM | POA: Diagnosis not present

## 2024-10-05 DIAGNOSIS — N1832 Chronic kidney disease, stage 3b: Secondary | ICD-10-CM | POA: Diagnosis present

## 2024-10-05 DIAGNOSIS — R8281 Pyuria: Secondary | ICD-10-CM | POA: Diagnosis not present

## 2024-10-05 DIAGNOSIS — Z833 Family history of diabetes mellitus: Secondary | ICD-10-CM

## 2024-10-05 DIAGNOSIS — R569 Unspecified convulsions: Secondary | ICD-10-CM | POA: Diagnosis not present

## 2024-10-05 DIAGNOSIS — R6521 Severe sepsis with septic shock: Secondary | ICD-10-CM | POA: Diagnosis present

## 2024-10-05 DIAGNOSIS — F02818 Dementia in other diseases classified elsewhere, unspecified severity, with other behavioral disturbance: Secondary | ICD-10-CM | POA: Diagnosis present

## 2024-10-05 DIAGNOSIS — A419 Sepsis, unspecified organism: Secondary | ICD-10-CM | POA: Diagnosis not present

## 2024-10-05 DIAGNOSIS — J9602 Acute respiratory failure with hypercapnia: Secondary | ICD-10-CM | POA: Diagnosis present

## 2024-10-05 DIAGNOSIS — E872 Acidosis, unspecified: Secondary | ICD-10-CM

## 2024-10-05 DIAGNOSIS — E1165 Type 2 diabetes mellitus with hyperglycemia: Secondary | ICD-10-CM | POA: Diagnosis not present

## 2024-10-05 DIAGNOSIS — F1027 Alcohol dependence with alcohol-induced persisting dementia: Secondary | ICD-10-CM | POA: Diagnosis present

## 2024-10-05 DIAGNOSIS — R4182 Altered mental status, unspecified: Secondary | ICD-10-CM | POA: Diagnosis not present

## 2024-10-05 DIAGNOSIS — Z66 Do not resuscitate: Secondary | ICD-10-CM | POA: Diagnosis present

## 2024-10-05 DIAGNOSIS — R001 Bradycardia, unspecified: Secondary | ICD-10-CM | POA: Diagnosis present

## 2024-10-05 DIAGNOSIS — E870 Hyperosmolality and hypernatremia: Secondary | ICD-10-CM | POA: Diagnosis present

## 2024-10-05 DIAGNOSIS — G9389 Other specified disorders of brain: Secondary | ICD-10-CM | POA: Diagnosis present

## 2024-10-05 DIAGNOSIS — Z9079 Acquired absence of other genital organ(s): Secondary | ICD-10-CM

## 2024-10-05 DIAGNOSIS — Z8489 Family history of other specified conditions: Secondary | ICD-10-CM

## 2024-10-05 DIAGNOSIS — G9340 Encephalopathy, unspecified: Secondary | ICD-10-CM | POA: Diagnosis not present

## 2024-10-05 DIAGNOSIS — A4159 Other Gram-negative sepsis: Secondary | ICD-10-CM | POA: Diagnosis present

## 2024-10-05 DIAGNOSIS — Z87442 Personal history of urinary calculi: Secondary | ICD-10-CM

## 2024-10-05 DIAGNOSIS — E860 Dehydration: Secondary | ICD-10-CM | POA: Diagnosis present

## 2024-10-05 DIAGNOSIS — Z7984 Long term (current) use of oral hypoglycemic drugs: Secondary | ICD-10-CM

## 2024-10-05 DIAGNOSIS — R7989 Other specified abnormal findings of blood chemistry: Secondary | ICD-10-CM | POA: Diagnosis present

## 2024-10-05 DIAGNOSIS — G309 Alzheimer's disease, unspecified: Secondary | ICD-10-CM | POA: Diagnosis present

## 2024-10-05 DIAGNOSIS — E11649 Type 2 diabetes mellitus with hypoglycemia without coma: Secondary | ICD-10-CM | POA: Diagnosis present

## 2024-10-05 DIAGNOSIS — E785 Hyperlipidemia, unspecified: Secondary | ICD-10-CM | POA: Diagnosis present

## 2024-10-05 DIAGNOSIS — Z7189 Other specified counseling: Secondary | ICD-10-CM | POA: Diagnosis not present

## 2024-10-05 DIAGNOSIS — Z87440 Personal history of urinary (tract) infections: Secondary | ICD-10-CM

## 2024-10-05 DIAGNOSIS — L8990 Pressure ulcer of unspecified site, unspecified stage: Secondary | ICD-10-CM

## 2024-10-05 DIAGNOSIS — Z711 Person with feared health complaint in whom no diagnosis is made: Secondary | ICD-10-CM | POA: Diagnosis not present

## 2024-10-05 DIAGNOSIS — Z79899 Other long term (current) drug therapy: Secondary | ICD-10-CM

## 2024-10-05 DIAGNOSIS — N12 Tubulo-interstitial nephritis, not specified as acute or chronic: Secondary | ICD-10-CM | POA: Diagnosis present

## 2024-10-05 DIAGNOSIS — I959 Hypotension, unspecified: Secondary | ICD-10-CM | POA: Diagnosis present

## 2024-10-05 DIAGNOSIS — Z8261 Family history of arthritis: Secondary | ICD-10-CM

## 2024-10-05 DIAGNOSIS — Z6828 Body mass index (BMI) 28.0-28.9, adult: Secondary | ICD-10-CM

## 2024-10-05 DIAGNOSIS — I259 Chronic ischemic heart disease, unspecified: Secondary | ICD-10-CM | POA: Diagnosis present

## 2024-10-05 DIAGNOSIS — Z91048 Other nonmedicinal substance allergy status: Secondary | ICD-10-CM

## 2024-10-05 DIAGNOSIS — Z83511 Family history of glaucoma: Secondary | ICD-10-CM

## 2024-10-05 DIAGNOSIS — Z8269 Family history of other diseases of the musculoskeletal system and connective tissue: Secondary | ICD-10-CM

## 2024-10-05 DIAGNOSIS — Z1612 Extended spectrum beta lactamase (ESBL) resistance: Secondary | ICD-10-CM | POA: Diagnosis present

## 2024-10-05 LAB — URINALYSIS, W/ REFLEX TO CULTURE (INFECTION SUSPECTED)
Bacteria, UA: NONE SEEN
Bilirubin Urine: NEGATIVE
Glucose, UA: NEGATIVE mg/dL
Ketones, ur: NEGATIVE mg/dL
Nitrite: NEGATIVE
Protein, ur: 30 mg/dL — AB
RBC / HPF: >50 RBC/hpf (ref 0–5)
Specific Gravity, Urine: 1.015 (ref 1.005–1.030)
pH: 6 (ref 5.0–8.0)

## 2024-10-05 LAB — CBC WITH DIFFERENTIAL/PLATELET
Abs Immature Granulocytes: 0.02 K/uL (ref 0.00–0.07)
Basophils Absolute: 0 K/uL (ref 0.0–0.1)
Basophils Relative: 0 %
Eosinophils Absolute: 0 K/uL (ref 0.0–0.5)
Eosinophils Relative: 1 %
HCT: 39.3 % (ref 39.0–52.0)
Hemoglobin: 12.6 g/dL — ABNORMAL LOW (ref 13.0–17.0)
Immature Granulocytes: 0 %
Lymphocytes Relative: 26 %
Lymphs Abs: 1.3 K/uL (ref 0.7–4.0)
MCH: 30.9 pg (ref 26.0–34.0)
MCHC: 32.1 g/dL (ref 30.0–36.0)
MCV: 96.3 fL (ref 80.0–100.0)
Monocytes Absolute: 0.7 K/uL (ref 0.1–1.0)
Monocytes Relative: 14 %
Neutro Abs: 2.9 K/uL (ref 1.7–7.7)
Neutrophils Relative %: 59 %
Platelets: 94 K/uL — ABNORMAL LOW (ref 150–400)
RBC: 4.08 MIL/uL — ABNORMAL LOW (ref 4.22–5.81)
RDW: 15.2 % (ref 11.5–15.5)
WBC: 5 K/uL (ref 4.0–10.5)
nRBC: 0 % (ref 0.0–0.2)

## 2024-10-05 LAB — COMPREHENSIVE METABOLIC PANEL WITH GFR
ALT: 45 U/L — ABNORMAL HIGH (ref 0–44)
AST: 59 U/L — ABNORMAL HIGH (ref 15–41)
Albumin: 3.1 g/dL — ABNORMAL LOW (ref 3.5–5.0)
Alkaline Phosphatase: 211 U/L — ABNORMAL HIGH (ref 38–126)
Anion gap: 9 (ref 5–15)
BUN: 32 mg/dL — ABNORMAL HIGH (ref 8–23)
CO2: 25 mmol/L (ref 22–32)
Calcium: 9.5 mg/dL (ref 8.9–10.3)
Chloride: 114 mmol/L — ABNORMAL HIGH (ref 98–111)
Creatinine, Ser: 1.81 mg/dL — ABNORMAL HIGH (ref 0.61–1.24)
GFR, Estimated: 39 mL/min — ABNORMAL LOW (ref >60)
Glucose, Bld: 135 mg/dL — ABNORMAL HIGH (ref 70–99)
Potassium: 5.4 mmol/L — ABNORMAL HIGH (ref 3.5–5.1)
Sodium: 148 mmol/L — ABNORMAL HIGH (ref 135–145)
Total Bilirubin: 0.5 mg/dL (ref 0.0–1.2)
Total Protein: 7.1 g/dL (ref 6.5–8.1)

## 2024-10-05 LAB — BLOOD GAS, VENOUS
Acid-base deficit: 1 mmol/L (ref 0.0–2.0)
Bicarbonate: 27.6 mmol/L (ref 20.0–28.0)
Drawn by: 1517
O2 Saturation: 51.9 %
Patient temperature: 35.7
pCO2, Ven: 60 mmHg (ref 44–60)
pH, Ven: 7.27 (ref 7.25–7.43)
pO2, Ven: <31 mmHg — CL (ref 32–45)

## 2024-10-05 LAB — CBG MONITORING, ED
Glucose-Capillary: 136 mg/dL — ABNORMAL HIGH (ref 70–99)
Glucose-Capillary: 109 mg/dL — ABNORMAL HIGH (ref 70–99)
Glucose-Capillary: 53 mg/dL — ABNORMAL LOW (ref 70–99)
Glucose-Capillary: 133 mg/dL — ABNORMAL HIGH (ref 70–99)

## 2024-10-05 LAB — LACTIC ACID, PLASMA: Lactic Acid, Venous: 2.6 mmol/L (ref 0.5–1.9)

## 2024-10-05 LAB — VALPROIC ACID LEVEL: Valproic Acid Lvl: 52 ug/mL (ref 50–100)

## 2024-10-05 MED ORDER — SODIUM CHLORIDE 0.9 % IV SOLN
2.0000 g | Freq: Once | INTRAVENOUS | Status: AC
  Administered 2024-10-06: 2 g via INTRAVENOUS
  Filled 2024-10-05: qty 20

## 2024-10-05 MED ORDER — NOREPINEPHRINE 4 MG/250ML-% IV SOLN
0.0000 ug/min | INTRAVENOUS | Status: DC
  Administered 2024-10-05: 2 ug/min via INTRAVENOUS
  Administered 2024-10-06: 4 ug/min via INTRAVENOUS
  Filled 2024-10-05 (×2): qty 250

## 2024-10-05 MED ORDER — VANCOMYCIN HCL 2000 MG/400ML IV SOLN
2000.0000 mg | Freq: Once | INTRAVENOUS | Status: AC
  Administered 2024-10-06: 2000 mg via INTRAVENOUS
  Filled 2024-10-05: qty 400

## 2024-10-05 MED ORDER — CHLORHEXIDINE GLUCONATE CLOTH 2 % EX PADS
6.0000 | MEDICATED_PAD | Freq: Every day | CUTANEOUS | Status: DC
  Administered 2024-10-05 – 2024-10-14 (×10): 6 via TOPICAL

## 2024-10-05 MED ORDER — DEXTROSE-SODIUM CHLORIDE 5-0.45 % IV SOLN: INTRAVENOUS | Status: AC

## 2024-10-05 MED ORDER — SODIUM CHLORIDE 0.9 % IV BOLUS
1000.0000 mL | Freq: Once | INTRAVENOUS | Status: AC
  Administered 2024-10-05: 1000 mL via INTRAVENOUS

## 2024-10-05 MED ORDER — DEXTROSE-SODIUM CHLORIDE 5-0.45 % IV SOLN: INTRAVENOUS | Status: DC

## 2024-10-05 MED ORDER — DEXTROSE 50 % IV SOLN
50.0000 mL | Freq: Once | INTRAVENOUS | Status: AC
  Administered 2024-10-05: 50 mL via INTRAVENOUS
  Filled 2024-10-05: qty 50

## 2024-10-05 MED ORDER — LACTATED RINGERS IV BOLUS
1000.0000 mL | Freq: Once | INTRAVENOUS | Status: AC
  Administered 2024-10-05: 1000 mL via INTRAVENOUS

## 2024-10-05 MED ORDER — METRONIDAZOLE 500 MG/100ML IV SOLN
500.0000 mg | Freq: Once | INTRAVENOUS | Status: AC
  Administered 2024-10-06: 500 mg via INTRAVENOUS
  Filled 2024-10-05: qty 100

## 2024-10-05 MED ORDER — SODIUM CHLORIDE 0.9 % IV SOLN: 250.0000 mL | INTRAVENOUS | Status: AC

## 2024-10-05 MED ORDER — NALOXONE HCL 2 MG/2ML IJ SOSY
2.0000 mg | PREFILLED_SYRINGE | Freq: Once | INTRAMUSCULAR | Status: AC
  Administered 2024-10-05: 2 mg via INTRAVENOUS
  Filled 2024-10-05: qty 2

## 2024-10-05 NOTE — ED Provider Notes (Signed)
 [place] [facility] AT [facility] Provider Note  CSN: [id number] Arrival date & time: 10/05/24 1930  Chief Complaint(s) Altered Mental Status  HPI Jeffery [name] is a 73 y.o. male history of dementia, diabetes, hypertension, hyperlipidemia presenting to the emergency department with loss of consciousness.  Patient was apparently eating, slumped over and was taken to his room.  Seemed to be unresponsive while paramedics were called, paramedics found him to be hypotensive.  En route paramedics gave him 2 mg of Narcan and he seemed to respond to this.  They report he became more awake following this.  Patient unable to provide any history.  History is limited due to his dementia.   Past Medical History Past Medical History:  Diagnosis Date   Cancer [facility])    prostate   Cervical spondylosis    Dementia associated with alcoholism (HCC)    from pt's neurology notes   Diabetes mellitus, type II (HCC)    Glaucoma    Hematuria    radiation cystitis   History of migraine headaches    Hx of arthroscopic knee surgery    Left knee   Hyperlipidemia    Hypertension    Hypertension    Hypertension    Impaired fasting glucose    Memory loss    Progressive gait disorder    Renal disorder    kidney stone   Varicose veins    Patient Active Problem List   Diagnosis Date Noted   Hypotension 10/05/2024   Type II diabetes mellitus, uncontrolled 02/13/2020   Dementia associated with alcoholism (HCC) 02/13/2020   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   Insomnia disorder related to known organic factor 02/13/2020   Alzheimer's dementia with behavioral disturbance (HCC)    Essential hypertension    Hyperlipidemia    Type 2 diabetes mellitus with stage 3a chronic kidney disease, with long-term current use of insulin  (HCC)    Alcohol abuse    Seizures (HCC) 07/05/2019   Anemia due to blood loss, chronic 05/27/2018   Recurrent hematuria 05/27/2018   History of  prostate cancer 05/27/2018   Acute urinary retention 05/10/2018   Memory disorder 12/13/2017   AKI (acute kidney injury) 02/16/2017   Hyponatremia 02/16/2017   Hypokalemia 02/15/2017   ED (erectile dysfunction) of organic origin 11/11/2016   Malignant neoplasm of prostate (HCC) 11/01/2014   Syncope and collapse 12/07/2012   Hyperthyroidism 12/07/2012   Other malaise and fatigue 12/07/2012   Disturbance of skin sensation 12/07/2012   Cervical spondylosis without myelopathy 12/07/2012   Abnormality of gait 12/07/2012   Cerebellar ataxia in diseases classified elsewhere (HCC) 12/07/2012   Home Medication(s) Prior to Admission medications   Medication Sig Start Date End Date Taking? Authorizing Provider  aspirin  EC 81 MG EC tablet Take 1 tablet (81 mg total) by mouth daily. 07/09/19   Ricky Fines, MD  benzonatate  (TESSALON ) 200 MG capsule Take 1 capsule (200 mg total) by mouth 3 (three) times daily as needed for cough. 05/24/21   Van Knee, MD  clotrimazole  (LOTRIMIN ) 1 % cream Apply to affected area 2 times daily 07/06/21   Arloa Suzen RAMAN, NP  donepezil  (ARICEPT ) 10 MG tablet Take one tablet at bedtime.  Please call [phone] to schedule an appt. Patient taking differently: No sig reported 06/19/19   Gayland Lauraine PARAS, NP  fluticasone  (FLONASE ) 50 MCG/ACT nasal spray Place 2 sprays into both nostrils daily. 05/24/21   Mortenson, Ashley, MD  glimepiride  (AMARYL ) 2 MG tablet Take  2 mg by mouth every morning. 04/16/19   [provider]  KLOR-CON  M20 20 MEQ tablet Take 20 mEq by mouth daily.  02/18/19   [provider]  LEVEMIR  FLEXTOUCH 100 UNIT/ML Pen INJECT 70 UNITS INTO THE SKIN DAILY WITH BREAKFAST. 01/29/20   Nida, Gebreselassie W, MD  levETIRAcetam  (KEPPRA ) 1000 MG tablet Take 1 tablet (1,000 mg total) by mouth 2 (two) times daily. 07/08/19   Ricky Fines, MD  memantine  (NAMENDA ) 10 MG tablet TAKE 1 TABLET BY MOUTH TWICE A DAY Patient taking differently: Take 10 mg by  mouth 2 (two) times daily. 06/05/20   Gayland Lauraine PARAS, NP  Multiple Vitamin (MULTIVITAMIN WITH MINERALS) TABS tablet Take 1 tablet by mouth daily. Patient not taking: No sig reported 07/09/19   Ricky Fines, MD  ondansetron  (ZOFRAN ) 4 MG tablet Take 1 tablet (4 mg total) by mouth every 6 (six) hours. 07/30/24   Bauer, Collin S, PA-C  vitamin B-12 1000 MCG tablet Take 1 tablet (1,000 mcg total) by mouth daily. Patient not taking: No sig reported 07/09/19   Ricky Fines, MD                                                                                                                                    Past Surgical History Past Surgical History:  Procedure Laterality Date   CHOLECYSTECTOMY     EYE SURGERY     PROSTATE SURGERY     Family History Family History  Problem Relation Age of Onset   Glaucoma Mother    Arthritis Father    Diabetes Father    Fibromyalgia Sister    Gait disorder Maternal Uncle        progessive gait disorder    Social History Social History   Tobacco Use   Smoking status: Former   Smokeless tobacco: Never  [employer]   Vaping status: Never Used  Substance Use Topics   Alcohol use: Yes    Comment: occasionally   Drug use: No   Allergies Penicillins, Amlodipine , Atenolol, Clonidine derivatives, Metformin  and related, and Tape  Review of Systems Review of Systems  Unable to perform ROS: Dementia    Physical Exam Vital Signs  I have reviewed the triage vital signs BP (!) 85/53   Pulse (!) 45   Temp (!) 96.3 F (35.7 C) (Axillary)   Resp 10   Ht 6' 2 (1.88 m)   Wt 100.7 kg   SpO2 98%   BMI 28.50 kg/m  Physical Exam Vitals and nursing note reviewed.  Constitutional:      General: He is not in acute distress.    Appearance: Normal appearance.  HENT:     Mouth/Throat:     Mouth: Mucous membranes are moist.  Eyes:     Conjunctiva/sclera: Conjunctivae normal.  Cardiovascular:     Rate and Rhythm: Normal rate and regular rhythm.   Pulmonary:  Effort: Pulmonary effort is normal. No respiratory distress.     Breath sounds: Normal breath sounds.  Abdominal:     General: Abdomen is flat.     Palpations: Abdomen is soft.     Tenderness: There is no abdominal tenderness.  Musculoskeletal:     Right lower leg: No edema.     Left lower leg: No edema.  Skin:    General: Skin is warm and dry.     Capillary Refill: Capillary refill takes less than 2 seconds.  Neurological:     Mental Status: He is alert.     Comments: Moves all 4 extremities.  Does not follow commands. Somnolent, arousable to sternal rub, says ow and goes back to sleep  Psychiatric:        Mood and Affect: Mood normal.        Behavior: Behavior normal.     ED Results and Treatments Labs (all labs ordered are listed, but only abnormal results are displayed) Labs Reviewed  COMPREHENSIVE METABOLIC PANEL WITH GFR - Abnormal; Notable for the following components:      Result Value   Sodium 148 (*)    Potassium 5.4 (*)    Chloride 114 (*)    Glucose, Bld 135 (*)    BUN 32 (*)    Creatinine, Ser 1.81 (*)    Albumin 3.1 (*)    AST 59 (*)    ALT 45 (*)    Alkaline Phosphatase 211 (*)    GFR, Estimated 39 (*)    All other components within normal limits  CBC WITH DIFFERENTIAL/PLATELET - Abnormal; Notable for the following components:   RBC 4.08 (*)    Hemoglobin 12.6 (*)    Platelets 94 (*)    All other components within normal limits  BLOOD GAS, VENOUS - Abnormal; Notable for the following components:   pO2, Ven <31 (*)    All other components within normal limits  URINALYSIS, W/ REFLEX TO CULTURE (INFECTION SUSPECTED) - Abnormal; Notable for the following components:   APPearance HAZY (*)    Hgb urine dipstick LARGE (*)    Protein, ur 30 (*)    Leukocytes,Ua MODERATE (*)    All other components within normal limits  CBG MONITORING, ED - Abnormal; Notable for the following components:   Glucose-Capillary 136 (*)    All other  components within normal limits  CBG MONITORING, ED - Abnormal; Notable for the following components:   Glucose-Capillary 109 (*)    All other components within normal limits  CBG MONITORING, ED - Abnormal; Notable for the following components:   Glucose-Capillary 53 (*)    All other components within normal limits  CBG MONITORING, ED - Abnormal; Notable for the following components:   Glucose-Capillary 133 (*)    All other components within normal limits  CULTURE, BLOOD (ROUTINE X 2)  CULTURE, BLOOD (ROUTINE X 2)  URINE CULTURE  MRSA NEXT GEN BY PCR, NASAL  VALPROIC ACID LEVEL  URINE DRUG SCREEN  LACTIC ACID, PLASMA  LACTIC ACID, PLASMA  I-STAT CHEM 8, ED  Radiology DG Chest Portable 1 View Result Date: 10/05/2024 EXAM: 1 VIEW(S) XRAY OF THE CHEST 10/05/2024 08:43:17 PM COMPARISON: 07/30/2024 CLINICAL HISTORY: shob FINDINGS: LUNGS AND PLEURA: No focal pulmonary opacity. No pleural effusion. No pneumothorax. HEART AND MEDIASTINUM: No acute abnormality of the cardiac and mediastinal silhouettes. BONES AND SOFT TISSUES: No acute osseous abnormality. IMPRESSION: 1. No acute cardiopulmonary process identified. Electronically signed by: Oneil Devonshire MD 10/05/2024 08:45 PM EST RP Workstation: [id number]   CT Head Wo Contrast Result Date: 10/05/2024 EXAM: CT HEAD WITHOUT CONTRAST 10/05/2024 08:24:09 PM TECHNIQUE: CT of the head was performed without the administration of intravenous contrast. Automated exposure control, iterative reconstruction, and/or weight based adjustment of the mA/kV was utilized to reduce the radiation dose to as low as reasonably achievable. COMPARISON: 8 / 6 / 20 CLINICAL HISTORY: Mental status change, unknown cause. FINDINGS: BRAIN AND VENTRICLES: Age-related cerebral volume loss. Periventricular white matter changes, likely sequela of chronic small  vessel ischemic disease. Atherosclerotic calcifications in intracranial carotid and vertebral arteries. No acute hemorrhage. No evidence of acute infarct. No hydrocephalus. No extra-axial collection. No mass effect or midline shift. ORBITS: Bilateral lens replacements. No acute abnormality. SINUSES: No acute abnormality. SOFT TISSUES AND SKULL: No acute soft tissue abnormality. No skull fracture. IMPRESSION: 1. No acute intracranial abnormality. Electronically signed by: Norman Gatlin MD 10/05/2024 08:30 PM EST RP Workstation: [id number]    Pertinent labs & imaging results that were available during my care of the patient were reviewed by me and considered in my medical decision making (see MDM for details).  Medications Ordered in ED Medications  dextrose  5 % and 0.45 % NaCl infusion ( Intravenous New Bag/Given 10/05/24 2251)  0.9 %  sodium chloride  infusion (has no administration in time range)  norepinephrine (LEVOPHED) 4mg  in (0.016 mg/mL) premix infusion (2 mcg/min Intravenous New Bag/Given 10/05/24 2242)  cefTRIAXone (ROCEPHIN) 2 g in sodium chloride  0.9 % 100 mL IVPB (has no administration in time range)  metroNIDAZOLE (FLAGYL) IVPB 500 mg (has no administration in time range)  vancomycin (VANCOREADY) IVPB 2000 mg/400 mL (has no administration in time range)  Chlorhexidine Gluconate Cloth 2 % PADS 6 each (has no administration in time range)  sodium chloride  0.9 % bolus 1,000 mL (0 mLs Intravenous Stopped 10/05/24 2120)  sodium chloride  0.9 % bolus 1,000 mL (0 mLs Intravenous Stopped 10/05/24 2159)  naloxone [facility]) injection 2 mg (2 mg Intravenous Given 10/05/24 2056)  lactated ringers  bolus 1,000 mL (1,000 mLs Intravenous New Bag/Given 10/05/24 2203)  dextrose  50 % solution 50 mL (50 mLs Intravenous Given 10/05/24 2222)                                                                                                                                     Procedures .Critical  Care  Performed by: Francesca Elsie CROME, MD Authorized by: Francesca Elsie CROME, MD   Critical care provider statement:  Critical care time (minutes):  30   Critical care was necessary to treat or prevent imminent or life-threatening deterioration of the following conditions:  Circulatory failure and dehydration   Critical care was time spent personally by me on the following activities:  Development of treatment plan with patient or surrogate, discussions with consultants, evaluation of patient's response to treatment, examination of patient, ordering and review of laboratory studies, ordering and review of radiographic studies, ordering and performing treatments and interventions, pulse oximetry, re-evaluation of patient's condition and review of old charts   Care discussed with: admitting provider     (including critical care time)  Medical Decision Making / ED Course   MDM:  73 year old presenting with episode of loss of consciousness at dinner.  Patient was apparently unresponsive and hypotensive en route.  He received Narcan and had possible improvement to this.  They paramedics report that he had pinpoint pupils.  Unclear why patient would have been receiving opiates as they are not on his medication list. Examination is without focal finding other than somnolence.  Will consider other causes of loss of consciousness such as syncope, intracranial process, toxic metabolic process, arrhythmia check further workup including CT head, EKG, laboratory testing, urine drug screen.  Will reassess.   Clinical Course as of 10/05/24 2257  Fri Oct 05, 2024  2252 Workup overall without clear cause of the patient's symptoms.  Did attempt additional dose of Narcan without any difference in mentation.  Urinalysis without evidence of UTI.  Patient with persistent hypotension despite fluids so we will start on peripheral pressor.  Will cover broadly with antibiotics although no clear infection patient  remains hypotensive.  Extremities are warm.  Labs do show AKI and mild hypernatremia, signs of dehydration.  Did receive IV fluids.  Given hypernatremia started on D5 half NS. [WS]    Clinical Course User Index [WS] Francesca Elsie CROME, MD     Additional history obtained: -Additional history obtained from ems -External records from outside source obtained and reviewed including: Chart review including previous notes, labs, imaging, consultation notes including prior notes    Lab Tests: -I ordered, reviewed, and interpreted labs.   The pertinent results include:   Labs Reviewed  COMPREHENSIVE METABOLIC PANEL WITH GFR - Abnormal; Notable for the following components:      Result Value   Sodium 148 (*)    Potassium 5.4 (*)    Chloride 114 (*)    Glucose, Bld 135 (*)    BUN 32 (*)    Creatinine, Ser 1.81 (*)    Albumin 3.1 (*)    AST 59 (*)    ALT 45 (*)    Alkaline Phosphatase 211 (*)    GFR, Estimated 39 (*)    All other components within normal limits  CBC WITH DIFFERENTIAL/PLATELET - Abnormal; Notable for the following components:   RBC 4.08 (*)    Hemoglobin 12.6 (*)    Platelets 94 (*)    All other components within normal limits  BLOOD GAS, VENOUS - Abnormal; Notable for the following components:   pO2, Ven <31 (*)    All other components within normal limits  URINALYSIS, W/ REFLEX TO CULTURE (INFECTION SUSPECTED) - Abnormal; Notable for the following components:   APPearance HAZY (*)    Hgb urine dipstick LARGE (*)    Protein, ur 30 (*)    Leukocytes,Ua MODERATE (*)    All other components within normal limits  CBG MONITORING, ED - Abnormal; Notable for the  following components:   Glucose-Capillary 136 (*)    All other components within normal limits  CBG MONITORING, ED - Abnormal; Notable for the following components:   Glucose-Capillary 109 (*)    All other components within normal limits  CBG MONITORING, ED - Abnormal; Notable for the following components:    Glucose-Capillary 53 (*)    All other components within normal limits  CBG MONITORING, ED - Abnormal; Notable for the following components:   Glucose-Capillary 133 (*)    All other components within normal limits  CULTURE, BLOOD (ROUTINE X 2)  CULTURE, BLOOD (ROUTINE X 2)  URINE CULTURE  MRSA NEXT GEN BY PCR, NASAL  VALPROIC ACID LEVEL  URINE DRUG SCREEN  LACTIC ACID, PLASMA  LACTIC ACID, PLASMA  I-STAT CHEM 8, ED    Notable for mild hypernatremia   EKG   EKG Interpretation Date/Time:  Friday October 05 2024 19:34:30 EST Ventricular Rate:  64 PR Interval:  178 QRS Duration:  89 QT Interval:  432 QTC Calculation: 446 R Axis:   -16  Text Interpretation: Sinus rhythm Borderline left axis deviation Low voltage, precordial leads Borderline T abnormalities, inferior leads Confirmed by Francesca Fallow ([id number]) on 10/05/2024 9:29:01 PM         Imaging Studies ordered: I ordered imaging studies including CXR, CT head  On my interpretation imaging demonstrates no acute process I independently visualized and interpreted imaging. I agree with the radiologist interpretation   Medicines ordered and prescription drug management: Meds ordered this encounter  Medications   sodium chloride  0.9 % bolus 1,000 mL   sodium chloride  0.9 % bolus 1,000 mL   naloxone (NARCAN) injection 2 mg   lactated ringers  bolus 1,000 mL   dextrose  5 % and 0.45 % NaCl infusion   dextrose  50 % solution 50 mL   0.9 %  sodium chloride  infusion   norepinephrine (LEVOPHED) 4mg  in (0.016 mg/mL) premix infusion    IV Access:   Peripheral   cefTRIAXone (ROCEPHIN) 2 g in sodium chloride  0.9 % 100 mL IVPB    Antibiotic Indication::   CAP   metroNIDAZOLE (FLAGYL) IVPB 500 mg   vancomycin (VANCOREADY) IVPB 2000 mg/400 mL    Indication::   Sepsis   Chlorhexidine Gluconate Cloth 2 % PADS 6 each    -I have reviewed the patients home medicines and have made adjustments as needed   Consultations  Obtained: I requested consultation with the hospitalist,  and discussed lab and imaging findings as well as pertinent plan - they recommend: admission   Cardiac Monitoring: The patient was maintained on a cardiac monitor.  I personally viewed and interpreted the cardiac monitored which showed an underlying rhythm of: sinus bradycardia   Social Determinants of Health:  Diagnosis or treatment significantly limited by social determinants of health: alcohol use   Reevaluation: After the interventions noted above, I reevaluated the patient and found that their symptoms have stayed the same  Co morbidities that complicate the patient evaluation  Past Medical History:  Diagnosis Date   Cancer [facility])    prostate   Cervical spondylosis    Dementia associated with alcoholism (HCC)    from pt's neurology notes   Diabetes mellitus, type II (HCC)    Glaucoma    Hematuria    radiation cystitis   History of migraine headaches    Hx of arthroscopic knee surgery    Left knee   Hyperlipidemia    Hypertension    Hypertension  Hypertension    Impaired fasting glucose    Memory loss    Progressive gait disorder    Renal disorder    kidney stone   Varicose veins       Dispostion: Disposition decision including need for hospitalization was considered, and patient admitted to the hospital.    Final Clinical Impression(s) / ED Diagnoses Final diagnoses:  Hypotension, unspecified hypotension type     This chart was dictated using voice recognition software.  Despite best efforts to proofread,  errors can occur which can change the documentation meaning.    Francesca Elsie CROME, MD 10/05/24 2257

## 2024-10-05 NOTE — ED Notes (Signed)
 [phone] Jeffery [name]- Wife

## 2024-10-05 NOTE — ED Triage Notes (Signed)
 Pt arrives from assisted living - [facility] on [address] - EMS called out for unresponsive where staff reports patient slumped over after eating, they took him to his room and called EMS. EMS reports giving 2 mg Narcan PTA, also noted to have BP of 60/40 with CBG 184. Pt holding head up off stretcher and alert.

## 2024-10-05 NOTE — H&P (Addendum)
 History and Physical    Patient: Jeffery [name] FMW:[id number] DOB: 05/13/1951 DOA: 10/05/2024 DOS: the patient was seen and examined on 10/05/2024 PCP: Sheryle Carwin, MD  Patient coming from: [facility] on [address] [place]   Chief Complaint:  Chief Complaint  Patient presents with   Altered Mental Status   HPI: Jeffery [name] is a 73 y.o. male with medical history significant of hypertension, type 2 diabetes mellitus, dementia, prostate cancer who presents to the emergency department from [facility] on [address] [place] via EMS due to loss of consciousness.  Apparently, patient was eating when he suddenly slumped over and was unresponsive.  EMS was activated, on arrival of EMS team, patient was noted to be hypotensive, Narcan was given with temporary improvement in mental status.  Patient was unable to provide history in the ED possibly due to underlying dementia.  ED course In the emergency department, temperature was 96.3 F, pulse 58, but other vital signs were within normal range.  Workup in the ED showed normal CBC except hemoglobin of 12.6 and platelets of 94.  BMP shows sodium 148, potassium 5.4, chloride 114, bicarb 25, blood glucose 135, BUN 32, creatinine 1.81 (was 1.62 on 07/30/2024), albumin 3.1, AST 59, ALT 45, ALP 211, eGFR 39.  Blood culture pending.  Urine drug screen was negative, lactic acid 2.6 > 2.7, troponin 92.  Urinalysis was positive for hematuria and elevated WBC. Chest x-ray showed no acute cardiopulmonary process CT head without contrast showed no acute intracranial abnormality 3 L of IV fluid was provided, but patient continued to be hypotensive, so he was started on Levophed and broad-spectrum antibiotics.    Review of Systems: unable to review all systems due to the inability of the patient to answer questions. Past Medical History:  Diagnosis Date   Cancer [facility])    prostate   Cervical spondylosis    Dementia associated with alcoholism (HCC)    from  pt's neurology notes   Diabetes mellitus, type II (HCC)    Glaucoma    Hematuria    radiation cystitis   History of migraine headaches    Hx of arthroscopic knee surgery    Left knee   Hyperlipidemia    Hypertension    Hypertension    Hypertension    Impaired fasting glucose    Memory loss    Progressive gait disorder    Renal disorder    kidney stone   Varicose veins    Past Surgical History:  Procedure Laterality Date   CHOLECYSTECTOMY     EYE SURGERY     PROSTATE SURGERY     Social History:  reports that he has quit smoking. He has never used smokeless tobacco. He reports current alcohol use. He reports that he does not use drugs.  Allergies  Allergen Reactions   Penicillins Hives, Nausea And Vomiting and Swelling    Swelling of lips Has patient had a PCN reaction causing immediate rash, facial/tongue/throat swelling, SOB or lightheadedness with hypotension: Yes Has patient had a PCN reaction causing severe rash involving mucus membranes or skin necrosis: No Has patient had a PCN reaction that required hospitalization No Has patient had a PCN reaction occurring within the last 10 years: No If all of the above answers are NO, then may proceed with Cephalosporin use.    Amlodipine  Swelling    Caused swelling in feet and legs   Atenolol Swelling and Other (See Comments)    Angioedema  Clonidine Derivatives Swelling and Other (See Comments)    Angioedema    Metformin  And Related     Irritable    Tape Rash    No plastic, patterned tape!!    Family History  Problem Relation Age of Onset   Glaucoma Mother    Arthritis Father    Diabetes Father    Fibromyalgia Sister    Gait disorder Maternal Uncle        progessive gait disorder    Prior to Admission medications   Medication Sig Start Date End Date Taking? Authorizing Provider  aspirin  EC 81 MG EC tablet Take 1 tablet (81 mg total) by mouth daily. 07/09/19   Ricky Fines, MD  benzonatate  (TESSALON )  200 MG capsule Take 1 capsule (200 mg total) by mouth 3 (three) times daily as needed for cough. 05/24/21   Van Knee, MD  clotrimazole  (LOTRIMIN ) 1 % cream Apply to affected area 2 times daily 07/06/21   Arloa Suzen RAMAN, NP  donepezil  (ARICEPT ) 10 MG tablet Take one tablet at bedtime.  Please call [phone] to schedule an appt. Patient taking differently: No sig reported 06/19/19   Gayland Lauraine PARAS, NP  fluticasone  (FLONASE ) 50 MCG/ACT nasal spray Place 2 sprays into both nostrils daily. 05/24/21   Van Knee, MD  glimepiride  (AMARYL ) 2 MG tablet Take 2 mg by mouth every morning. 04/16/19   [provider]  KLOR-CON  M20 20 MEQ tablet Take 20 mEq by mouth daily.  02/18/19   [provider]  LEVEMIR  FLEXTOUCH 100 UNIT/ML Pen INJECT 70 UNITS INTO THE SKIN DAILY WITH BREAKFAST. 01/29/20   Nida, Gebreselassie W, MD  levETIRAcetam  (KEPPRA ) 1000 MG tablet Take 1 tablet (1,000 mg total) by mouth 2 (two) times daily. 07/08/19   Ricky Fines, MD  memantine  (NAMENDA ) 10 MG tablet TAKE 1 TABLET BY MOUTH TWICE A DAY Patient taking differently: Take 10 mg by mouth 2 (two) times daily. 06/05/20   Gayland Lauraine PARAS, NP  Multiple Vitamin (MULTIVITAMIN WITH MINERALS) TABS tablet Take 1 tablet by mouth daily. Patient not taking: No sig reported 07/09/19   Ricky Fines, MD  ondansetron  (ZOFRAN ) 4 MG tablet Take 1 tablet (4 mg total) by mouth every 6 (six) hours. 07/30/24   Bauer, Collin S, PA-C  vitamin B-12 1000 MCG tablet Take 1 tablet (1,000 mcg total) by mouth daily. Patient not taking: No sig reported 07/09/19   Ricky Fines, MD    Physical Exam: Vitals:   10/05/24 2130 10/05/24 2140 10/05/24 2200 10/05/24 2215  BP: (!) 87/54 (!) 111/49 (!) 108/58 (!) 85/53  Pulse: (!) 48 (!) 47 (!) 47 (!) 45  Resp: 11 16 12 10   Temp:      TempSrc:      SpO2: 99% 99% 99% 98%  Weight:      Height:       General: Elderly male.  Somnolent and on BiPAP, but not in any acute distress.  HEENT: NCAT.   PERRLA. EOMI. Sclerae anicteric.  Moist mucosal membranes. Neck: Neck supple without lymphadenopathy. No carotid bruits. No masses palpated.  Cardiovascular: Tachycardia.  Regular rate with normal S1-S2 sounds. No murmurs, rubs or gallops auscultated.   Respiratory: Clear breath sounds.  No accessory muscle use. Abdomen: Soft, nontender, nondistended. Active bowel sounds. No masses or hepatosplenomegaly  Skin: No rashes, lesions, or ulcerations.  Dry, warm to touch. Musculoskeletal: Somnolent, arousable to sternal rub, but quickly goes back to sleep.  2+ dorsalis pedis and radial pulses.  Psychiatric:  This cannot be assessed at this time due to being somnolent Neurologic: No focal neurological deficits.  Patient was able to move all extremities while sleeping.  Data Reviewed: EKG personally reviewed showed normal sinus rhythm at rate of 64 bpm  Assessment and Plan: Severe septic shock possibly due to unknown source Lactic acidosis Lactic acid 2.6 > 2.7 UTI POA presumed to be source at this time Patient met sepsis criteria due to hypothermia, tachypnea (met SIRS criteria) and with presumed UTI as source of infection Urine culture done on 12/04/2018 was positive for ESBL Klebsiella pneumoniae which was sensitive to imipenem and Zosyn Patient remained hypotensive despite 3 L of IV fluid IV Levophed started, continue Levophed to maintain MAP >65 Continue n.p.o. eLink recommended changing antibiotics to meropenem and vancomycin due to history of ESBL in prior urine culture  Acute hypercapnic respiratory failure Continue BiPAP with plan to wean patient off this as tolerated  Acute metabolic encephalopathy possibly secondary to above 2 Continue management as described above Continue fall precaution, aspiration precaution  Hypothermia Temperature 93.70F, continue Bair hugger TSH will be checked  Hyperkalemia K+ 5.4, IV hydration provided No acute EKG changes Repeat BMP and correct  potassium level  Hypernatremia Na 148, patient was started on D5 half-normal saline Sodium increased to 149 and patient became hypoglycemic while on D5 half-NS, fluid will be changed to D10 W Continue to monitor sodium levels with serial BMPs  Elevated troponin possible secondary to type II demand ischemia Troponin 92 > 123, continue to trend troponin  CKD 3B creatinine 1.81 (was 1.62 on 07/30/2024) Renally adjust medications, avoid nephrotoxic agents/dehydration/hypotension  Thrombocytopenia (chronic) Platelets 94, no source of bleeding at this time DVT prophylaxis will be temporarily held at this time with plan to reinstate with improved platelets Continue to monitor platelets with morning labs  Essential hypertension BP meds will be held at this time due to hypotension  Type 2 diabetes mellitus with hyperglycemia Continue ISS and hypoglycemia protocol Glimepiride  will be held at this time  Dementia Consider starting patient on Aricept  and Namenda  when he resumes oral intake    Advance Care Planning: Full code  Consults: None  Family Communication: Husband at bedside (sleeping)  Severity of Illness: The appropriate patient status for this patient is INPATIENT. Inpatient status is judged to be reasonable and necessary in order to provide the required intensity of service to ensure the patient's safety. The patient's presenting symptoms, physical exam findings, and initial radiographic and laboratory data in the context of their chronic comorbidities is felt to place them at high risk for further clinical deterioration. Furthermore, it is not anticipated that the patient will be medically stable for discharge from the hospital within 2 midnights of admission.   * I certify that at the point of admission it is my clinical judgment that the patient will require inpatient hospital care spanning beyond 2 midnights from the point of admission due to high intensity of service, high  risk for further deterioration and high frequency of surveillance required.*  Critical time: 71 minutes   Critical care personally provided  managing the patient due to high probability of clinically significant and life threatening deterioration. This critical care time included obtaining a history; examining the patient, pulse oximetry; ordering and review of studies; arranging urgent treatment with development of a management plan; evaluation of patient's response of treatment; frequent reassessment; and discussions with other providers.  This critical care time was performed to assess and manage the high probability of  imminent and life threatening deterioration that could result in multi-organ failure.    Author: Tajuanna Burnett, DO 10/05/2024 10:41 PM  For on call review [email or web address].

## 2024-10-05 NOTE — ED Notes (Signed)
 Patient to imaging.

## 2024-10-06 ENCOUNTER — Inpatient Hospital Stay (HOSPITAL_COMMUNITY)

## 2024-10-06 DIAGNOSIS — R578 Other shock: Secondary | ICD-10-CM

## 2024-10-06 DIAGNOSIS — G9340 Encephalopathy, unspecified: Secondary | ICD-10-CM | POA: Diagnosis not present

## 2024-10-06 DIAGNOSIS — I959 Hypotension, unspecified: Secondary | ICD-10-CM | POA: Diagnosis not present

## 2024-10-06 DIAGNOSIS — R4182 Altered mental status, unspecified: Secondary | ICD-10-CM | POA: Diagnosis not present

## 2024-10-06 DIAGNOSIS — N136 Pyonephrosis: Secondary | ICD-10-CM

## 2024-10-06 LAB — GLUCOSE, CAPILLARY
Glucose-Capillary: 102 mg/dL — ABNORMAL HIGH (ref 70–99)
Glucose-Capillary: 128 mg/dL — ABNORMAL HIGH (ref 70–99)
Glucose-Capillary: 139 mg/dL — ABNORMAL HIGH (ref 70–99)
Glucose-Capillary: 169 mg/dL — ABNORMAL HIGH (ref 70–99)
Glucose-Capillary: 47 mg/dL — ABNORMAL LOW (ref 70–99)
Glucose-Capillary: 61 mg/dL — ABNORMAL LOW (ref 70–99)
Glucose-Capillary: 61 mg/dL — ABNORMAL LOW (ref 70–99)
Glucose-Capillary: 78 mg/dL (ref 70–99)
Glucose-Capillary: 85 mg/dL (ref 70–99)
Glucose-Capillary: 85 mg/dL (ref 70–99)
Glucose-Capillary: 96 mg/dL (ref 70–99)

## 2024-10-06 LAB — BASIC METABOLIC PANEL WITH GFR
Anion gap: 7 (ref 5–15)
Anion gap: 10 (ref 5–15)
BUN: 24 mg/dL — ABNORMAL HIGH (ref 8–23)
BUN: 24 mg/dL — ABNORMAL HIGH (ref 8–23)
CO2: 26 mmol/L (ref 22–32)
CO2: 23 mmol/L (ref 22–32)
Calcium: 8.6 mg/dL — ABNORMAL LOW (ref 8.9–10.3)
Calcium: 9 mg/dL (ref 8.9–10.3)
Chloride: 115 mmol/L — ABNORMAL HIGH (ref 98–111)
Chloride: 112 mmol/L — ABNORMAL HIGH (ref 98–111)
Creatinine, Ser: 1.6 mg/dL — ABNORMAL HIGH (ref 0.61–1.24)
Creatinine, Ser: 1.65 mg/dL — ABNORMAL HIGH (ref 0.61–1.24)
GFR, Estimated: 45 mL/min — ABNORMAL LOW
GFR, Estimated: 43 mL/min — ABNORMAL LOW
Glucose, Bld: 56 mg/dL — ABNORMAL LOW (ref 70–99)
Glucose, Bld: 97 mg/dL (ref 70–99)
Potassium: 4.5 mmol/L (ref 3.5–5.1)
Potassium: 4.8 mmol/L (ref 3.5–5.1)
Sodium: 147 mmol/L — ABNORMAL HIGH (ref 135–145)
Sodium: 145 mmol/L (ref 135–145)

## 2024-10-06 LAB — TSH: TSH: 3.28 u[IU]/mL (ref 0.350–4.500)

## 2024-10-06 LAB — URINE DRUG SCREEN
Amphetamines: NEGATIVE
Barbiturates: NEGATIVE
Benzodiazepines: NEGATIVE
Cocaine: NEGATIVE
Fentanyl: NEGATIVE
Methadone Scn, Ur: NEGATIVE
Opiates: NEGATIVE
Tetrahydrocannabinol: NEGATIVE

## 2024-10-06 LAB — TROPONIN T, HIGH SENSITIVITY
Troponin T High Sensitivity: 92 ng/L — ABNORMAL HIGH (ref 0–19)
Troponin T High Sensitivity: 123 ng/L (ref 0–19)
Troponin T High Sensitivity: 146 ng/L (ref 0–19)

## 2024-10-06 LAB — BLOOD GAS, VENOUS
Acid-Base Excess: 1 mmol/L (ref 0.0–2.0)
Bicarbonate: 26 mmol/L (ref 20.0–28.0)
Drawn by: 4237
O2 Saturation: 95.3 %
Patient temperature: 36.9
pCO2, Ven: 42 mmHg — ABNORMAL LOW (ref 44–60)
pH, Ven: 7.4 (ref 7.25–7.43)
pO2, Ven: 71 mmHg — ABNORMAL HIGH (ref 32–45)

## 2024-10-06 LAB — HEMOGLOBIN A1C
Hgb A1c MFr Bld: 9.6 % — ABNORMAL HIGH (ref 4.8–5.6)
Mean Plasma Glucose: 228.82 mg/dL

## 2024-10-06 LAB — BLOOD GAS, ARTERIAL
Acid-Base Excess: 0.5 mmol/L (ref 0.0–2.0)
Acid-Base Excess: 0.4 mmol/L (ref 0.0–2.0)
Bicarbonate: 27 mmol/L (ref 20.0–28.0)
Bicarbonate: 22 mmol/L (ref 20.0–28.0)
Drawn by: 28340
Drawn by: 27016
FIO2: 40 %
O2 Saturation: 96.2 %
O2 Saturation: 97.8 %
Patient temperature: 37
Patient temperature: 37
pCO2 arterial: 50 mmHg — ABNORMAL HIGH (ref 32–48)
pCO2 arterial: 27 mmHg — ABNORMAL LOW (ref 32–48)
pH, Arterial: 7.34 — ABNORMAL LOW (ref 7.35–7.45)
pH, Arterial: 7.52 — ABNORMAL HIGH (ref 7.35–7.45)
pO2, Arterial: 76 mmHg — ABNORMAL LOW (ref 83–108)
pO2, Arterial: 106 mmHg (ref 83–108)

## 2024-10-06 LAB — CBC
HCT: 33.7 % — ABNORMAL LOW (ref 39.0–52.0)
Hemoglobin: 10.9 g/dL — ABNORMAL LOW (ref 13.0–17.0)
MCH: 31 pg (ref 26.0–34.0)
MCHC: 32.3 g/dL (ref 30.0–36.0)
MCV: 95.7 fL (ref 80.0–100.0)
Platelets: 97 K/uL — ABNORMAL LOW (ref 150–400)
RBC: 3.52 MIL/uL — ABNORMAL LOW (ref 4.22–5.81)
RDW: 15.2 % (ref 11.5–15.5)
WBC: 5.8 K/uL (ref 4.0–10.5)
nRBC: 0 % (ref 0.0–0.2)

## 2024-10-06 LAB — COMPREHENSIVE METABOLIC PANEL WITH GFR
ALT: 34 U/L (ref 0–44)
AST: 43 U/L — ABNORMAL HIGH (ref 15–41)
Albumin: 2.5 g/dL — ABNORMAL LOW (ref 3.5–5.0)
Alkaline Phosphatase: 159 U/L — ABNORMAL HIGH (ref 38–126)
Anion gap: 6 (ref 5–15)
BUN: 26 mg/dL — ABNORMAL HIGH (ref 8–23)
CO2: 26 mmol/L (ref 22–32)
Calcium: 8.2 mg/dL — ABNORMAL LOW (ref 8.9–10.3)
Chloride: 117 mmol/L — ABNORMAL HIGH (ref 98–111)
Creatinine, Ser: 1.45 mg/dL — ABNORMAL HIGH (ref 0.61–1.24)
GFR, Estimated: 51 mL/min — ABNORMAL LOW (ref >60)
Glucose, Bld: 69 mg/dL — ABNORMAL LOW (ref 70–99)
Potassium: 4.4 mmol/L (ref 3.5–5.1)
Sodium: 149 mmol/L — ABNORMAL HIGH (ref 135–145)
Total Bilirubin: 0.2 mg/dL (ref 0.0–1.2)
Total Protein: 5.6 g/dL — ABNORMAL LOW (ref 6.5–8.1)

## 2024-10-06 LAB — ECHOCARDIOGRAM COMPLETE
Area-P 1/2: 1.35 cm2
Height: 74 in
Weight: 3552 [oz_av]

## 2024-10-06 LAB — AMMONIA: Ammonia: 38 umol/L — ABNORMAL HIGH (ref 9–35)

## 2024-10-06 LAB — LACTIC ACID, PLASMA
Lactic Acid, Venous: 1.3 mmol/L (ref 0.5–1.9)
Lactic Acid, Venous: 1.5 mmol/L (ref 0.5–1.9)
Lactic Acid, Venous: 2.7 mmol/L (ref 0.5–1.9)

## 2024-10-06 LAB — PHOSPHORUS: Phosphorus: 2.5 mg/dL (ref 2.5–4.6)

## 2024-10-06 LAB — MRSA NEXT GEN BY PCR, NASAL: MRSA by PCR Next Gen: NOT DETECTED

## 2024-10-06 LAB — MAGNESIUM: Magnesium: 2.2 mg/dL (ref 1.7–2.4)

## 2024-10-06 MED ORDER — PANTOPRAZOLE SODIUM 40 MG IV SOLR
40.0000 mg | Freq: Every day | INTRAVENOUS | Status: DC
  Administered 2024-10-06 – 2024-10-12 (×7): 40 mg via INTRAVENOUS
  Filled 2024-10-06 (×7): qty 10

## 2024-10-06 MED ORDER — FENTANYL BOLUS VIA INFUSION
25.0000 ug | INTRAVENOUS | Status: DC | PRN
  Administered 2024-10-07: 50 ug via INTRAVENOUS
  Administered 2024-10-07 (×2): 100 ug via INTRAVENOUS
  Administered 2024-10-07: 25 ug via INTRAVENOUS
  Administered 2024-10-07 (×2): 50 ug via INTRAVENOUS
  Administered 2024-10-08: 100 ug via INTRAVENOUS
  Administered 2024-10-08: 50 ug via INTRAVENOUS
  Administered 2024-10-08: 100 ug via INTRAVENOUS
  Administered 2024-10-08: 25 ug via INTRAVENOUS
  Administered 2024-10-08 – 2024-10-09 (×4): 100 ug via INTRAVENOUS
  Administered 2024-10-10 (×3): 50 ug via INTRAVENOUS
  Administered 2024-10-10 – 2024-10-11 (×2): 100 ug via INTRAVENOUS

## 2024-10-06 MED ORDER — ACETAMINOPHEN 325 MG PO TABS: 650.0000 mg | ORAL_TABLET | Freq: Four times a day (QID) | ORAL | Status: DC | PRN

## 2024-10-06 MED ORDER — SODIUM CHLORIDE 0.9 % IV SOLN
500.0000 mg | Freq: Three times a day (TID) | INTRAVENOUS | Status: AC
  Administered 2024-10-06 – 2024-10-08 (×6): 500 mg via INTRAVENOUS
  Filled 2024-10-06: qty 500
  Filled 2024-10-06: qty 4
  Filled 2024-10-06 (×7): qty 5

## 2024-10-06 MED ORDER — DOCUSATE SODIUM 50 MG/5ML PO LIQD
100.0000 mg | Freq: Two times a day (BID) | ORAL | Status: DC
  Administered 2024-10-06 – 2024-10-09 (×7): 100 mg
  Filled 2024-10-06 (×7): qty 10

## 2024-10-06 MED ORDER — THIAMINE HCL 100 MG/ML IJ SOLN
100.0000 mg | INTRAMUSCULAR | Status: DC
  Administered 2024-10-08 – 2024-10-09 (×2): 100 mg via INTRAVENOUS
  Filled 2024-10-06 (×2): qty 2

## 2024-10-06 MED ORDER — SODIUM CHLORIDE 0.9 % IV SOLN
1.0000 g | Freq: Two times a day (BID) | INTRAVENOUS | Status: DC
  Administered 2024-10-06 (×2): 1 g via INTRAVENOUS
  Filled 2024-10-06 (×2): qty 20

## 2024-10-06 MED ORDER — FENTANYL 2500MCG IN NS 250ML (10MCG/ML) PREMIX INFUSION
0.0000 ug/h | INTRAVENOUS | Status: DC
  Administered 2024-10-06: 50 ug/h via INTRAVENOUS
  Administered 2024-10-08 – 2024-10-09 (×2): 25 ug/h via INTRAVENOUS
  Administered 2024-10-11: 150 ug/h via INTRAVENOUS
  Filled 2024-10-06 (×4): qty 250

## 2024-10-06 MED ORDER — PROPOFOL 1000 MG/100ML IV EMUL
0.0000 ug/kg/min | INTRAVENOUS | Status: DC
  Administered 2024-10-07: 10 ug/kg/min via INTRAVENOUS
  Administered 2024-10-07: 20 ug/kg/min via INTRAVENOUS
  Filled 2024-10-06 (×2): qty 100

## 2024-10-06 MED ORDER — VANCOMYCIN HCL 1750 MG/350ML IV SOLN
1750.0000 mg | INTRAVENOUS | Status: DC
  Administered 2024-10-07: 1750 mg via INTRAVENOUS
  Filled 2024-10-06: qty 350

## 2024-10-06 MED ORDER — ONDANSETRON HCL 4 MG PO TABS: 4.0000 mg | ORAL_TABLET | Freq: Four times a day (QID) | ORAL | Status: DC | PRN

## 2024-10-06 MED ORDER — ACETAMINOPHEN 650 MG RE SUPP: 650.0000 mg | Freq: Four times a day (QID) | RECTAL | Status: DC | PRN

## 2024-10-06 MED ORDER — ONDANSETRON HCL 4 MG/2ML IJ SOLN: 4.0000 mg | Freq: Four times a day (QID) | INTRAMUSCULAR | Status: DC | PRN

## 2024-10-06 MED ORDER — HYDROCORTISONE SOD SUC (PF) 100 MG IJ SOLR
100.0000 mg | Freq: Two times a day (BID) | INTRAMUSCULAR | Status: DC
  Administered 2024-10-06 (×2): 100 mg via INTRAVENOUS
  Filled 2024-10-06 (×2): qty 2

## 2024-10-06 MED ORDER — ROCURONIUM BROMIDE 10 MG/ML (PF) SYRINGE
PREFILLED_SYRINGE | INTRAVENOUS | Status: AC
  Administered 2024-10-06: 100 mg
  Filled 2024-10-06: qty 10

## 2024-10-06 MED ORDER — DEXTROSE 50 % IV SOLN
INTRAVENOUS | Status: AC
  Administered 2024-10-06: 25 g via INTRAVENOUS
  Filled 2024-10-06: qty 50

## 2024-10-06 MED ORDER — FENTANYL 2500MCG IN NS 250ML (10MCG/ML) PREMIX INFUSION
INTRAVENOUS | Status: AC
  Administered 2024-10-07: 25 ug via INTRAVENOUS
  Filled 2024-10-06: qty 250

## 2024-10-06 MED ORDER — LACTATED RINGERS IV SOLN: INTRAVENOUS | Status: DC

## 2024-10-06 MED ORDER — ETOMIDATE 2 MG/ML IV SOLN
INTRAVENOUS | Status: AC
  Administered 2024-10-06: 20 mg
  Filled 2024-10-06: qty 20

## 2024-10-06 MED ORDER — FAMOTIDINE 20 MG PO TABS
20.0000 mg | ORAL_TABLET | Freq: Two times a day (BID) | ORAL | Status: DC
  Administered 2024-10-06 – 2024-10-07 (×2): 20 mg
  Filled 2024-10-06 (×2): qty 1

## 2024-10-06 MED ORDER — DEXTROSE 50 % IV SOLN
25.0000 mL | Freq: Once | INTRAVENOUS | Status: AC
  Administered 2024-10-06: 25 mL via INTRAVENOUS
  Filled 2024-10-06: qty 50

## 2024-10-06 MED ORDER — DEXTROSE-SODIUM CHLORIDE 5-0.45 % IV SOLN: INTRAVENOUS | Status: DC

## 2024-10-06 MED ORDER — SODIUM CHLORIDE 0.9 % IV SOLN
1.0000 g | Freq: Three times a day (TID) | INTRAVENOUS | Status: DC
  Administered 2024-10-06 – 2024-10-07 (×3): 1 g via INTRAVENOUS
  Filled 2024-10-06 (×3): qty 20

## 2024-10-06 MED ORDER — POLYETHYLENE GLYCOL 3350 17 G PO PACK
17.0000 g | PACK | Freq: Every day | ORAL | Status: DC
  Administered 2024-10-06 – 2024-10-09 (×4): 17 g
  Filled 2024-10-06 (×4): qty 1

## 2024-10-06 MED ORDER — LACTATED RINGERS IV BOLUS
500.0000 mL | Freq: Once | INTRAVENOUS | Status: AC
  Administered 2024-10-06: 500 mL via INTRAVENOUS

## 2024-10-06 MED ORDER — THIAMINE HCL 100 MG/ML IJ SOLN
INTRAMUSCULAR | Status: AC
  Filled 2024-10-06: qty 6

## 2024-10-06 MED ORDER — DEXTROSE 50 % IV SOLN
25.0000 g | INTRAVENOUS | Status: AC
  Administered 2024-10-06: 25 g via INTRAVENOUS

## 2024-10-06 MED ORDER — INSULIN ASPART 100 UNIT/ML IJ SOLN
0.0000 [IU] | INTRAMUSCULAR | Status: DC
  Administered 2024-10-07: 3 [IU] via SUBCUTANEOUS
  Administered 2024-10-07 (×2): 2 [IU] via SUBCUTANEOUS
  Administered 2024-10-07: 3 [IU] via SUBCUTANEOUS
  Administered 2024-10-07: 2 [IU] via SUBCUTANEOUS
  Administered 2024-10-07: 3 [IU] via SUBCUTANEOUS
  Administered 2024-10-07: 2 [IU] via SUBCUTANEOUS
  Administered 2024-10-08: 1 [IU] via SUBCUTANEOUS
  Administered 2024-10-08: 2 [IU] via SUBCUTANEOUS
  Administered 2024-10-08: 3 [IU] via SUBCUTANEOUS
  Filled 2024-10-06: qty 2
  Filled 2024-10-06: qty 1
  Filled 2024-10-06: qty 3
  Filled 2024-10-06: qty 1
  Filled 2024-10-06: qty 2
  Filled 2024-10-06 (×2): qty 1

## 2024-10-06 MED ORDER — FENTANYL CITRATE (PF) 50 MCG/ML IJ SOSY
25.0000 ug | PREFILLED_SYRINGE | Freq: Once | INTRAMUSCULAR | Status: DC
  Filled 2024-10-06 (×2): qty 1

## 2024-10-06 MED ORDER — THIAMINE HCL 100 MG/ML IJ SOLN
INTRAMUSCULAR | Status: AC
Start: 2024-10-06 — End: 2024-10-06
  Filled 2024-10-06: qty 6

## 2024-10-06 MED ORDER — LEVETIRACETAM (KEPPRA) 500 MG/5 ML ADULT IV PUSH
500.0000 mg | Freq: Two times a day (BID) | INTRAVENOUS | Status: DC
Start: 2024-10-06 — End: 2024-10-06

## 2024-10-06 MED ORDER — IOHEXOL 300 MG/ML  SOLN
80.0000 mL | Freq: Once | INTRAMUSCULAR | Status: AC | PRN
  Administered 2024-10-06: 80 mL via INTRAVENOUS

## 2024-10-06 MED ORDER — DEXTROSE 10 % IV SOLN: INTRAVENOUS | Status: DC

## 2024-10-06 MED ORDER — HEPARIN SODIUM (PORCINE) 5000 UNIT/ML IJ SOLN
5000.0000 [IU] | Freq: Three times a day (TID) | INTRAMUSCULAR | Status: DC
  Administered 2024-10-06: 5000 [IU] via SUBCUTANEOUS
  Filled 2024-10-06: qty 1

## 2024-10-06 MED ORDER — VALPROATE SODIUM 100 MG/ML IV SOLN
250.0000 mg | Freq: Four times a day (QID) | INTRAVENOUS | Status: DC
  Administered 2024-10-06 – 2024-10-08 (×7): 250 mg via INTRAVENOUS
  Filled 2024-10-06 (×2): qty 2.5
  Filled 2024-10-06: qty 250
  Filled 2024-10-06 (×12): qty 2.5

## 2024-10-06 NOTE — Plan of Care (Signed)
 Pt now intubated for airway protection and decreased neuro status, weaned off levo and remains sedated now on vent

## 2024-10-06 NOTE — Progress Notes (Signed)
 Approx 1115 MD to bedside, states pt will be intubated. ED provider and respiratory to bedside. Intubated with 20 etomidate and rocuronium at 1125/ 7.5 ETT 23 @lip , color change 1128.

## 2024-10-06 NOTE — ED Provider Notes (Signed)
    Procedures  Procedure Name: Intubation Date/Time: 10/06/2024 11:54 AM  Performed by: Gennaro Duwaine CROME, DOPre-anesthesia Checklist: Patient identified, Patient being monitored, Emergency Drugs available, Timeout performed and Suction available Oxygen Delivery Method: Non-rebreather mask Preoxygenation: Pre-oxygenation with 100% oxygen Induction Type: Rapid sequence Ventilation: Mask ventilation without difficulty Laryngoscope Size: Mac and 4 Grade View: Grade III Tube size: 7.5 mm Number of attempts: 2 (Initial attempt successful however patient had a cough leak. Another 7.5 tube was exchanged over a bougie without difficulty.) Airway Equipment and Method: Bougie stylet, Rigid stylet and Video-laryngoscopy Placement Confirmation: ETT inserted through vocal cords under direct vision, CO2 detector and Breath sounds checked- equal and bilateral Secured at: 23 cm Tube secured with: ETT holder Difficulty Due To: Difficult Airway- due to reduced neck mobility, Difficult Airway- due to anterior larynx, Difficult Airway- due to limited oral opening and Difficult Airway- due to dentition Future Recommendations: Recommend- induction with short-acting agent, and alternative techniques readily available           Gennaro Duwaine CROME, DO 10/06/24 1155

## 2024-10-06 NOTE — Progress Notes (Signed)
 [facility] PROGRESS NOTE  Jeffery [name] (DOB: Aug 23, 1951) FMW:[id number] PCP: Sheryle Carwin, MD  Brief Narrative: Jeffery [name] is a 73 y.o. male with a history of dementia, T2DM, HTN, stage IIIb CKD, thrombocytopenia who presented to the ED on 10/05/2024 from group home due to unresponsiveness. He was found to have lactic acidosis, pyuria, hypoglycemia, hypotension despite IVF resuscitation and initiation of antibiotics that would cover ESBL (due to hx of same), as well as hypernatremia, hyperkalemia, and he's remained encephalopathic. Admitted to ICU on BiPAP, full code, on NE gtt peripherally, D10, adding stress steroids. CCM consulted.   I have spoken with the patient's spouse and daughter, they're very clear about the desire to pursue all life-prolonging measures including intubation, remains full code.   He remains very poorly responsive, secretions accumulating in BiPAP, also growing more bradycardic (pt's family reports this is chronic)  Subjective: Unresponsive, withdraws generally to nailbed pressure.   Objective: BP (!) 134/57   Pulse (!) 49   Temp (!) 97 F (36.1 C) (Rectal)   Resp 14   Ht 6' 2 (1.88 m)   Wt 100.7 kg   SpO2 100%   BMI 28.50 kg/m   Gen: Chronically and acutely ill appearing male Pulm: Bradypnic, diminished, no wheezes, no retractions CV: Regular bradycardia with p waves on monitor, trace LE edema, no JVD GI: Soft, nondistended, slight suprapubic fullness, does not elicit grimace Neuro: Poorly responsive Ext: Warm, digits with intact cap refill, cooler toes, withdraws x4 ext Skin: No open wounds on visualized skin   Assessment & Plan: Septic shock suspected to be due to UTI, hx ESBL:  - Continue vancomycin, meropenem and monitoring cultures - Continue levophed peripherally, BP appears to be improving. If proceeding with intubation, would also place CVC.  - Trend lactic acidosis.  - Continue D10 gtt, add stress steroids empirically.   Report of GI  bleeding at facility, normocytic anemia: Reported by family, had plans for GI outpatient evaluation this coming week. Hgb is down, though has had many values below current state, not near transfusion threshold and no active bleeding currently noted.  - Trend CBC, hgb down with volume expansion thus far.  - Given some diagnostic uncertainty and inability to accurately gauge tenderness, will check CT abd/pelvis  Acute metabolic encephalopathy on chronic dementia: Dementia since 2018, recently transitioned to care home.  - Ammonia is 38, don't suspect this to be causative at this time, if intubated, would give lactulose by tube.  - TSH wnl - CT head nonacute - Hx seizure disorder, will restart keppra  by IV and request EEG   Acute hypercarbic respiratory failure:  - Unable to maintain BiPAP due to secretions and mental status depression, will monitor VBG, CCM consulted  Demand myocardial ischemia:  - Check echo - Trend troponin  Hypernatremia, dehydration:  - Continue D10 gtt, trend BMP   Hyperkalemia: Resolved.   AKI on stage IIIb CKD: Improved with volume resuscitation.  - Bladder scan now, check collecting system with CT.   T2DM on insulin , with hypoglycemia:  - Continue SSI (much lower dose than reported home dose) and D10 gtt.   Jeffery KATHEE Come, MD [facility] [email or web address] 10/06/2024, 10:15 AM

## 2024-10-06 NOTE — Progress Notes (Signed)
 eLink Physician-Brief Progress Note Patient Name: Jeffery [name] DOB: 10-Apr-1951 MRN: [id number]   Date of Service  10/06/2024  HPI/Events of Note  73/M with hx of dementia, DM, hypertension, dyslipidemia brought in after loss of consciousness.  Pt was apparently eating, then he suddenly slumped over.  Pt was noted to be hypotensive by EMS.  He was given narcan by EMS wth improvement in mental status.  In the ED, pt has remained hypotensive despite 3L IVF bolus.  Pt started on levophed and on broad spectrum antibiotics.   VBG 7.27/60 UA moderate leuk esterase, (+) pyuria CT head - no acute intracranial abnormality Urine culture in the past with ESBL.  Crea 1.81 <-- 1.62   eICU Interventions  Severe sepsis with shock UTI Encephalopathy Hypercapnic respiratory failure Hyperkalemia CKD Lactic acidosis  Start the patient on BIPAP.  Recheck ABG at 0300.  Continue broad spectrum antibiotics.  Continue to titrate levophed to keep MAP >65.  Check urine tox.  Follow up cultures.  Change antibiotics to Meropenem and vancomycin given hx of ESBL in prior urine cultures.  Continue to trend lactate.  Start heparin  for DVT prophylaxis.  Keep NPO.       Intervention Category Evaluation Type: New Patient Evaluation  Jeffery [name] 10/06/2024, 1:00 AM

## 2024-10-06 NOTE — Consult Note (Addendum)
 TELE-PCCM CONSULT This consult was provided via telemedicine with 2-way video and audio communication.  Physician location [facility]. Patient location [facility]  NAME:  Jeffery [name], MRN:  [id number], DOB:  [birth year], LOS: 1 ADMISSION DATE:  10/05/2024, CONSULTATION DATE:  10/06/24 REFERRING MD:  Bryn, CHIEF COMPLAINT:  AMS   History of Present Illness:  73 year old man w/ hx of alcoholic dementia, DM2, HTN, HLD p/w sudden onset AMS.  Workup revealed septic shock, pyuria, bilateral pyelo and presumed septic encephalopathy. CT head neg.  Unfortunately very somnolent with high secretion burden so PCCM consulted.  Pertinent  Medical History   Past Medical History:  Diagnosis Date   Cancer [facility])    prostate   Cervical spondylosis    Dementia associated with alcoholism (HCC)    from pt's neurology notes   Diabetes mellitus, type II (HCC)    Glaucoma    Hematuria    radiation cystitis   History of migraine headaches    Hx of arthroscopic knee surgery    Left knee   Hyperlipidemia    Hypertension    Hypertension    Hypertension    Impaired fasting glucose    Memory loss    Progressive gait disorder    Renal disorder    kidney stone   Varicose veins      Significant Hospital Events: Including procedures, antibiotic start and stop dates in addition to other pertinent events     Interim History / Subjective:  consult  Objective   Blood pressure (!) 123/55, pulse (!) 47, temperature (!) 97 F (36.1 C), temperature source Rectal, resp. rate 20, height 6' 2 (1.88 m), weight 100.7 kg, SpO2 100%.    Vent Mode: PRVC FiO2 (%):  [30 %-50 %] 40 % Set Rate:  [20 bmp] 20 bmp Vt Set:  [339 mL] 660 mL PEEP:  [5 cmH20] 5 cmH20 Plateau Pressure:  [18 cmH20-22 cmH20] 18 cmH20   Intake/Output Summary (Last 24 hours) at 10/06/2024 1625 Last data filed at 10/06/2024 1520 Gross per 24 hour  Intake 5028.84 ml  Output 1050 ml  Net 3978.84 ml   Filed Weights    10/05/24 1938  Weight: 100.7 kg    Examination: General: chronically ill man slumped over in bed Resp: mildly elevated rate but nonlabored  Lab/imaging review: Mild thrombocytopenia Bibasilar consolidation, bilateral pyelo CXR clear  Resolved Hospital Problem list   N/A  Assessment & Plan:  AMS, inability to protect airway Encephalopathy- ABG okay, ammonia okay, septic vs. Subclinical status Bilateral pyelo Likely aspiration pneumonitis Alcoholic dementia Mild thrombocytopenia  - Empiric abx, f/u culture data - Agree with ETT, vent bundle, EEG, MRI - Empiric high dose thiamine  - If MRI, EEG unrevealing and does not improve with vent + abx may need to  - Will follow, discussed case with primary  PPI for GI ppx Consider starting DVT ppx   Labs   CBC: Recent Labs  Lab 10/05/24 1936 10/06/24 0355  WBC 5.0 5.8  NEUTROABS 2.9  --   HGB 12.6* 10.9*  HCT 39.3 33.7*  MCV 96.3 95.7  PLT 94* 97*    Basic Metabolic Panel: Recent Labs  Lab 10/05/24 1936 10/06/24 0355 10/06/24 1015  NA 148* 149* 147*  K 5.4* 4.4 4.5  CL 114* 117* 115*  CO2 25 26 26   GLUCOSE 135* 69* 56*  BUN 32* 26* 24*  CREATININE 1.81* 1.45* 1.60*  CALCIUM  9.5 8.2* 8.6*  MG  --  2.2  --   PHOS  --  2.5  --    GFR: Estimated Creatinine Clearance: 51.3 mL/min (A) (by C-G formula based on SCr of 1.6 mg/dL (H)). Recent Labs  Lab 10/05/24 1936 10/05/24 2147 10/05/24 2355 10/06/24 0355 10/06/24 1155 10/06/24 1357  WBC 5.0  --   --  5.8  --   --   LATICACIDVEN  --  2.6* 2.7*  --  1.3 1.5    Liver Function Tests: Recent Labs  Lab 10/05/24 1936 10/06/24 0355  AST 59* 43*  ALT 45* 34  ALKPHOS 211* 159*  BILITOT 0.5 0.2  PROT 7.1 5.6*  ALBUMIN 3.1* 2.5*   No results for input(s): LIPASE, AMYLASE in the last 168 hours. Recent Labs  Lab 10/06/24 0613  AMMONIA 38*    ABG    Component Value Date/Time   PHART 7.52 (H) 10/06/2024 1602   PCO2ART 27 (L) 10/06/2024 1602    PO2ART 106 10/06/2024 1602   HCO3 22.0 10/06/2024 1602   TCO2 36 02/15/2017 2137   ACIDBASEDEF 1.0 10/05/2024 1958   O2SAT 97.8 10/06/2024 1602     Coagulation Profile: No results for input(s): INR, PROTIME in the last 168 hours.  Cardiac Enzymes: No results for input(s): CKTOTAL, CKMB, CKMBINDEX, TROPONINI in the last 168 hours.  HbA1C: Hgb A1c MFr Bld  Date/Time Value Ref Range Status  10/06/2024 03:55 AM 9.6 (H) 4.8 - 5.6 % Final    Comment:    (NOTE) Diagnosis of Diabetes The following HbA1c ranges recommended by the American Diabetes Association (ADA) may be used as an aid in the diagnosis of diabetes mellitus.  Hemoglobin             Suggested A1C NGSP%              Diagnosis  <5.7                   Non Diabetic  5.7-6.4                Pre-Diabetic  >6.4                   Diabetic  <7.0                   Glycemic control for                       adults with diabetes.    07/05/2019 03:38 PM 11.5 (H) 4.8 - 5.6 % Final    Comment:    (NOTE) Pre diabetes:          5.7%-6.4% Diabetes:              >6.4% Glycemic control for   <7.0% adults with diabetes     CBG: Recent Labs  Lab 10/06/24 0744 10/06/24 1105 10/06/24 1149 10/06/24 1308 10/06/24 1515  GLUCAP 78 47* 85 61* 96    Review of Systems:   Unresponsive  Past Medical History:  He,  has a past medical history of Cancer (HCC), Cervical spondylosis, Dementia associated with alcoholism (HCC), Diabetes mellitus, type II (HCC), Glaucoma, Hematuria, History of migraine headaches, arthroscopic knee surgery, Hyperlipidemia, Hypertension, Hypertension, Hypertension, Impaired fasting glucose, Memory loss, Progressive gait disorder, Renal disorder, and Varicose veins.   Surgical History:   Past Surgical History:  Procedure Laterality Date   CHOLECYSTECTOMY     EYE SURGERY     PROSTATE SURGERY  Social History:   reports that he has quit smoking. He has never used smokeless tobacco.  He reports current alcohol use. He reports that he does not use drugs.   Family History:  His family history includes Arthritis in his father; Diabetes in his father; Fibromyalgia in his sister; Gait disorder in his maternal uncle; Glaucoma in his mother.   Allergies Allergies  Allergen Reactions   Penicillins Hives, Nausea And Vomiting and Swelling    Swelling of lips Has patient had a PCN reaction causing immediate rash, facial/tongue/throat swelling, SOB or lightheadedness with hypotension: Yes Has patient had a PCN reaction causing severe rash involving mucus membranes or skin necrosis: No Has patient had a PCN reaction that required hospitalization No Has patient had a PCN reaction occurring within the last 10 years: No If all of the above answers are NO, then may proceed with Cephalosporin use.    Amlodipine  Swelling    Caused swelling in feet and legs   Atenolol Swelling and Other (See Comments)    Angioedema    Clonidine Derivatives Swelling and Other (See Comments)    Angioedema    Metformin  And Related     Irritable    Tape Rash    No plastic, patterned tape!!     Home Medications  Prior to Admission medications   Medication Sig Start Date End Date Taking? Authorizing Provider  ADMELOG  SOLOSTAR 100 UNIT/ML KwikPen Inject into the skin. 09/05/24  Yes [provider]  aspirin  EC 81 MG EC tablet Take 1 tablet (81 mg total) by mouth daily. 07/09/19  Yes Ricky Fines, MD  atorvastatin  (LIPITOR) 10 MG tablet Take 10 mg by mouth daily. 09/21/24  Yes [provider]  divalproex  (DEPAKOTE ) 500 MG DR tablet Take 500 mg by mouth 2 (two) times daily. 09/21/24  Yes [provider]  donepezil  (ARICEPT ) 10 MG tablet Take one tablet at bedtime.  Please call [phone] to schedule an appt. Patient taking differently: Take 10 mg by mouth at bedtime. 06/19/19  Yes Gayland Lauraine PARAS, NP  glimepiride  (AMARYL ) 2 MG tablet Take 2 mg by mouth every morning. 04/16/19   Yes [provider]  OLANZapine  (ZYPREXA ) 5 MG tablet Take 5 mg by mouth at bedtime. 09/21/24  Yes [provider]  clotrimazole  (LOTRIMIN ) 1 % cream Apply to affected area 2 times daily 07/06/21   Arloa Suzen RAMAN, NP  fluticasone  (FLONASE ) 50 MCG/ACT nasal spray Place 2 sprays into both nostrils daily. 05/24/21   Mortenson, Ashley, MD  KLOR-CON  M20 20 MEQ tablet Take 20 mEq by mouth daily.  02/18/19   [provider]  LEVEMIR  FLEXTOUCH 100 UNIT/ML Pen INJECT 70 UNITS INTO THE SKIN DAILY WITH BREAKFAST. 01/29/20   Nida, Gebreselassie W, MD  levETIRAcetam  (KEPPRA ) 1000 MG tablet Take 1 tablet (1,000 mg total) by mouth 2 (two) times daily. 07/08/19   Ricky Fines, MD  memantine  (NAMENDA ) 10 MG tablet TAKE 1 TABLET BY MOUTH TWICE A DAY Patient taking differently: Take 10 mg by mouth 2 (two) times daily. 06/05/20   Gayland Lauraine PARAS, NP  Multiple Vitamin (MULTIVITAMIN WITH MINERALS) TABS tablet Take 1 tablet by mouth daily. Patient not taking: No sig reported 07/09/19   Ricky Fines, MD  ondansetron  (ZOFRAN ) 4 MG tablet Take 1 tablet (4 mg total) by mouth every 6 (six) hours. 07/30/24   Beola Terrall RAMAN, PA-C     Total time: 31 mins     Toribio JAYSON Sharps, MD 10/06/24 4:25 PM [place]  [facility]  For contact information, see Amion. If no response to pager, please call PCCM consult pager. After hours, 7PM- 7AM, please call Elink.

## 2024-10-06 NOTE — Progress Notes (Signed)
 Patient safely transported to CT accompanied by this nurse and RT, patient now back in room.

## 2024-10-06 NOTE — Plan of Care (Signed)

## 2024-10-06 NOTE — Progress Notes (Signed)
  Echocardiogram 2D Echocardiogram has been performed.  Tinnie FORBES Gosling RDCS 10/06/2024, 3:22 PM

## 2024-10-06 NOTE — Progress Notes (Signed)
 SABRA

## 2024-10-06 NOTE — TOC Initial Note (Signed)
 Transition of Care [facility]) - Initial/Assessment Note    Patient Details  Name: Jeffery [name] MRN: [id number] Date of Birth: [birth year]  Transition of [facility] CM/SW Contact:    Noreen KATHEE Pinal, LCSWA Phone Number: 10/06/2024, 8:37 AM  Clinical Narrative:                  Patient is at risk for readmission due to high admission score. Patient was admitted for Hypotension. CSW spoke with spouse and daughter to assess patient . Patient lives in an adult care home and has been there for a month. Both shared that patient does not use any equipment and staff assist with all ADL's. Daughter mentioned that they are expecting a 30-day DC notice from the Adult Care Home, but have not gotten it yet, she asked if it was possible for CSW to assist with alternative placement. It was shared with daughter and spouse that it will depends on how long patient is here at the hospital, what PT recommends to determine appropriate placement in an timely manner. Family is currently paying privately at the adult care home - $2500. ICM will continue to follow.    Barriers to Discharge: Continued Medical Work up   Patient Goals and CMS Choice Patient states their goals for this hospitalization and ongoing recovery are:: Alternative placement - family is expecting a 30-day DC notice from Adult Care Home CMS Medicare.gov Compare Post Acute Care list provided to:: Patient Represenative (must comment) Choice offered to / list presented to : Spouse, Adult Children      Expected Discharge Plan and Services       Living arrangements for the past 2 months: Assisted Living Facility (Adult Care Home)                                      Prior Living Arrangements/Services Living arrangements for the past 2 months: Assisted Living Facility (Adult Care Home) Lives with:: Facility Resident Patient language and need for interpreter reviewed:: Yes Do you feel safe going back to the place where you live?: Yes       Need for Family Participation in Patient Care: Yes (Comment) Care giver support system in place?: Yes (comment)   Criminal Activity/Legal Involvement Pertinent to Current Situation/Hospitalization: No - Comment as needed  Activities of Daily Living   ADL Screening (condition at time of admission) Independently performs ADLs?: No Does the patient have a NEW difficulty with bathing/dressing/toileting/self-feeding that is expected to last >3 days?: No Does the patient have a NEW difficulty with getting in/out of bed, walking, or climbing stairs that is expected to last >3 days?: No Does the patient have a NEW difficulty with communication that is expected to last >3 days?: No Is the patient deaf or have difficulty hearing?: No Does the patient have difficulty seeing, even when wearing glasses/contacts?: No Does the patient have difficulty concentrating, remembering, or making decisions?: Yes  Permission Sought/Granted      Share Information with NAME: Merlynn and Gloria     Permission granted to share info w Relationship: Spouse and Daughter     Emotional Assessment Appearance:: Appears stated age Attitude/Demeanor/Rapport: Unable to Assess Affect (typically observed): Unable to Assess   Alcohol / Substance Use: Not Applicable Psych Involvement: No (comment)  Admission diagnosis:  Hypotension [I95.9] Hypotension, unspecified hypotension type [I95.9] Patient Active Problem List   Diagnosis Date Noted   Hypotension  10/05/2024   Type II diabetes mellitus, uncontrolled 02/13/2020   Dementia associated with alcoholism (HCC) 02/13/2020   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   Insomnia disorder related to known organic factor 02/13/2020   Alzheimer's dementia with behavioral disturbance (HCC)    Essential hypertension    Hyperlipidemia    Type 2 diabetes mellitus with stage 3a chronic kidney disease, with long-term current use of insulin  (HCC)    Alcohol abuse     Seizures (HCC) 07/05/2019   Anemia due to blood loss, chronic 05/27/2018   Recurrent hematuria 05/27/2018   History of prostate cancer 05/27/2018   Acute urinary retention 05/10/2018   Memory disorder 12/13/2017   AKI (acute kidney injury) 02/16/2017   Hyponatremia 02/16/2017   Hypokalemia 02/15/2017   ED (erectile dysfunction) of organic origin 11/11/2016   Malignant neoplasm of prostate (HCC) 11/01/2014   Syncope and collapse 12/07/2012   Hyperthyroidism 12/07/2012   Other malaise and fatigue 12/07/2012   Disturbance of skin sensation 12/07/2012   Cervical spondylosis without myelopathy 12/07/2012   Abnormality of gait 12/07/2012   Cerebellar ataxia in diseases classified elsewhere (HCC) 12/07/2012   PCP:  Sheryle Carwin, MD Pharmacy:   [facility] [phone] - [place], [place] - [address] AT [facility] [address] [place] [address] Phone: [phone] Fax: [phone]     Social Drivers of Health (SDOH) Social History: SDOH Screenings   Food Insecurity: Patient Unable To Answer (10/06/2024)  Housing: Patient Unable To Answer (10/06/2024)  Transportation Needs: Patient Unable To Answer (10/06/2024)  Utilities: Patient Unable To Answer (10/06/2024)  Depression (PHQ2-9): Low Risk  (02/06/2021)  Social Connections: Patient Unable To Answer (10/06/2024)  Tobacco Use: Medium Risk (10/05/2024)   SDOH Interventions:     Readmission Risk Interventions    10/06/2024    8:24 AM  Readmission Risk Prevention Plan  Transportation Screening Complete  HRI or Home Care Consult Complete  Social Work Consult for Recovery Care Planning/Counseling Complete  Palliative Care Screening Not Applicable  Medication Review Oceanographer) Complete

## 2024-10-06 NOTE — Progress Notes (Signed)
 Pharmacy Antibiotic Note  Jeffery [name] is a 73 y.o. male admitted on 10/05/2024 with sepsis.  Pharmacy has been consulted for Vancomycin dosing. Sepsis, h/o ESBL in urine. Scr improved from admission  Plan: Continue with Vancomycin 1750 mg IV Q 24 hrs. Goal AUC 400-550. Expected AUC: 508 SCr used: 1.45 Increase Merrem 1 gm IV q8h  F/u cxs and clinical progress Monitor V/S, labs and levels as indicated  Height: 6' 2 (188 cm) Weight: 100.7 kg (222 lb) IBW/kg (Calculated) : 82.2  Temp (24hrs), Avg:95.2 F (35.1 C), Min:93 F (33.9 C), Max:97 F (36.1 C)  Recent Labs  Lab 10/05/24 1936 10/05/24 2147 10/05/24 2355 10/06/24 0355  WBC 5.0  --   --  5.8  CREATININE 1.81*  --   --  1.45*  LATICACIDVEN  --  2.6* 2.7*  --     Estimated Creatinine Clearance: 56.6 mL/min (A) (by C-G formula based on SCr of 1.45 mg/dL (H)).    Allergies  Allergen Reactions   Penicillins Hives, Nausea And Vomiting and Swelling    Swelling of lips Has patient had a PCN reaction causing immediate rash, facial/tongue/throat swelling, SOB or lightheadedness with hypotension: Yes Has patient had a PCN reaction causing severe rash involving mucus membranes or skin necrosis: No Has patient had a PCN reaction that required hospitalization No Has patient had a PCN reaction occurring within the last 10 years: No If all of the above answers are NO, then may proceed with Cephalosporin use.    Amlodipine  Swelling    Caused swelling in feet and legs   Atenolol Swelling and Other (See Comments)    Angioedema    Clonidine Derivatives Swelling and Other (See Comments)    Angioedema    Metformin  And Related     Irritable    Tape Rash    No plastic, patterned tape!!    Antimicrobials this admission: Merrem 11/8 >>  Vancomycin  11/8 >>   Microbiology results: 11/7 BCx: ngtd 11/7 UCx: pending  11/7 MRSA PCR: pending  Thank you for allowing pharmacy to be a part of this patient's care.  Leoda Smithhart, BS Pharm D, BCPS Clinical Pharmacist 10/06/2024 11:16 AM

## 2024-10-07 ENCOUNTER — Inpatient Hospital Stay (HOSPITAL_COMMUNITY)

## 2024-10-07 DIAGNOSIS — G9340 Encephalopathy, unspecified: Secondary | ICD-10-CM | POA: Diagnosis not present

## 2024-10-07 DIAGNOSIS — R4182 Altered mental status, unspecified: Secondary | ICD-10-CM | POA: Diagnosis not present

## 2024-10-07 DIAGNOSIS — I959 Hypotension, unspecified: Secondary | ICD-10-CM | POA: Diagnosis not present

## 2024-10-07 DIAGNOSIS — N136 Pyonephrosis: Secondary | ICD-10-CM | POA: Diagnosis not present

## 2024-10-07 LAB — BLOOD GAS, ARTERIAL
Acid-base deficit: 1.1 mmol/L (ref 0.0–2.0)
Bicarbonate: 19.7 mmol/L — ABNORMAL LOW (ref 20.0–28.0)
Drawn by: 27016
O2 Saturation: 99.1 %
Patient temperature: 37
pCO2 arterial: 23 mmHg — ABNORMAL LOW (ref 32–48)
pH, Arterial: 7.54 — ABNORMAL HIGH (ref 7.35–7.45)
pO2, Arterial: 130 mmHg — ABNORMAL HIGH (ref 83–108)

## 2024-10-07 LAB — COMPREHENSIVE METABOLIC PANEL WITH GFR
ALT: 32 U/L (ref 0–44)
ALT: 31 U/L (ref 0–44)
AST: 43 U/L — ABNORMAL HIGH (ref 15–41)
AST: 38 U/L (ref 15–41)
Albumin: 2.7 g/dL — ABNORMAL LOW (ref 3.5–5.0)
Albumin: 2.1 g/dL — ABNORMAL LOW (ref 3.5–5.0)
Alkaline Phosphatase: 183 U/L — ABNORMAL HIGH (ref 38–126)
Alkaline Phosphatase: 150 U/L — ABNORMAL HIGH (ref 38–126)
Anion gap: 12 (ref 5–15)
Anion gap: 12 (ref 5–15)
BUN: 26 mg/dL — ABNORMAL HIGH (ref 8–23)
BUN: 26 mg/dL — ABNORMAL HIGH (ref 8–23)
CO2: 19 mmol/L — ABNORMAL LOW (ref 22–32)
CO2: 18 mmol/L — ABNORMAL LOW (ref 22–32)
Calcium: 8.7 mg/dL — ABNORMAL LOW (ref 8.9–10.3)
Calcium: 8.6 mg/dL — ABNORMAL LOW (ref 8.9–10.3)
Chloride: 110 mmol/L (ref 98–111)
Chloride: 111 mmol/L (ref 98–111)
Creatinine, Ser: 1.89 mg/dL — ABNORMAL HIGH (ref 0.61–1.24)
Creatinine, Ser: 2.02 mg/dL — ABNORMAL HIGH (ref 0.61–1.24)
GFR, Estimated: 37 mL/min — ABNORMAL LOW (ref >60)
GFR, Estimated: 34 mL/min — ABNORMAL LOW (ref >60)
Glucose, Bld: 260 mg/dL — ABNORMAL HIGH (ref 70–99)
Glucose, Bld: 169 mg/dL — ABNORMAL HIGH (ref 70–99)
Potassium: 4 mmol/L (ref 3.5–5.1)
Potassium: 3.7 mmol/L (ref 3.5–5.1)
Sodium: 140 mmol/L (ref 135–145)
Sodium: 141 mmol/L (ref 135–145)
Total Bilirubin: 0.7 mg/dL (ref 0.0–1.2)
Total Bilirubin: 0.7 mg/dL (ref 0.0–1.2)
Total Protein: 6.3 g/dL — ABNORMAL LOW (ref 6.5–8.1)
Total Protein: 6.2 g/dL — ABNORMAL LOW (ref 6.5–8.1)

## 2024-10-07 LAB — GLUCOSE, CAPILLARY
Glucose-Capillary: 198 mg/dL — ABNORMAL HIGH (ref 70–99)
Glucose-Capillary: 198 mg/dL — ABNORMAL HIGH (ref 70–99)
Glucose-Capillary: 210 mg/dL — ABNORMAL HIGH (ref 70–99)
Glucose-Capillary: 237 mg/dL — ABNORMAL HIGH (ref 70–99)
Glucose-Capillary: 245 mg/dL — ABNORMAL HIGH (ref 70–99)
Glucose-Capillary: 210 mg/dL — ABNORMAL HIGH (ref 70–99)
Glucose-Capillary: 170 mg/dL — ABNORMAL HIGH (ref 70–99)
Glucose-Capillary: 151 mg/dL — ABNORMAL HIGH (ref 70–99)
Glucose-Capillary: 195 mg/dL — ABNORMAL HIGH (ref 70–99)

## 2024-10-07 LAB — BLOOD GAS, VENOUS
Acid-base deficit: 3.4 mmol/L — ABNORMAL HIGH (ref 0.0–2.0)
Bicarbonate: 21.4 mmol/L (ref 20.0–28.0)
O2 Saturation: 96.3 %
Patient temperature: 35.1
pCO2, Ven: 34 mmHg — ABNORMAL LOW (ref 44–60)
pH, Ven: 7.4 (ref 7.25–7.43)
pO2, Ven: 86 mmHg — ABNORMAL HIGH (ref 32–45)

## 2024-10-07 LAB — CBC
HCT: 32.4 % — ABNORMAL LOW (ref 39.0–52.0)
Hemoglobin: 11 g/dL — ABNORMAL LOW (ref 13.0–17.0)
MCH: 31.6 pg (ref 26.0–34.0)
MCHC: 34 g/dL (ref 30.0–36.0)
MCV: 93.1 fL (ref 80.0–100.0)
Platelets: 80 K/uL — ABNORMAL LOW (ref 150–400)
RBC: 3.48 MIL/uL — ABNORMAL LOW (ref 4.22–5.81)
RDW: 15.2 % (ref 11.5–15.5)
WBC: 11.2 K/uL — ABNORMAL HIGH (ref 4.0–10.5)
nRBC: 0 % (ref 0.0–0.2)

## 2024-10-07 LAB — PHOSPHORUS: Phosphorus: 2.4 mg/dL — ABNORMAL LOW (ref 2.5–4.6)

## 2024-10-07 LAB — MAGNESIUM: Magnesium: 1.9 mg/dL (ref 1.7–2.4)

## 2024-10-07 LAB — AMMONIA: Ammonia: 33 umol/L (ref 9–35)

## 2024-10-07 LAB — TRIGLYCERIDES: Triglycerides: 83 mg/dL (ref <150)

## 2024-10-07 MED ORDER — ADULT MULTIVITAMIN W/MINERALS CH
1.0000 | ORAL_TABLET | Freq: Every day | ORAL | Status: DC
  Administered 2024-10-08 – 2024-10-12 (×4): 1
  Filled 2024-10-07 (×4): qty 1

## 2024-10-07 MED ORDER — OSMOLITE 1.5 CAL PO LIQD
1000.0000 mL | ORAL | Status: DC
  Administered 2024-10-07 – 2024-10-10 (×4): 1000 mL
  Filled 2024-10-07 (×6): qty 1000

## 2024-10-07 MED ORDER — PROSOURCE TF20 ENFIT COMPATIBL EN LIQD
60.0000 mL | Freq: Two times a day (BID) | ENTERAL | Status: DC
  Administered 2024-10-07 – 2024-10-10 (×6): 60 mL
  Filled 2024-10-07 (×6): qty 60

## 2024-10-07 MED ORDER — SODIUM CHLORIDE 0.9 % IV SOLN
1.0000 g | Freq: Two times a day (BID) | INTRAVENOUS | Status: DC
  Administered 2024-10-07 – 2024-10-08 (×2): 1 g via INTRAVENOUS
  Filled 2024-10-07 (×2): qty 20

## 2024-10-07 MED ORDER — NOREPINEPHRINE 4 MG/250ML-% IV SOLN
INTRAVENOUS | Status: AC
  Filled 2024-10-07: qty 250

## 2024-10-07 MED ORDER — HYDROCORTISONE SOD SUC (PF) 100 MG IJ SOLR
50.0000 mg | Freq: Three times a day (TID) | INTRAMUSCULAR | Status: DC
  Administered 2024-10-07: 50 mg via INTRAVENOUS
  Filled 2024-10-07: qty 2

## 2024-10-07 MED ORDER — K PHOS MONO-SOD PHOS DI & MONO 155-852-130 MG PO TABS
500.0000 mg | ORAL_TABLET | Freq: Two times a day (BID) | ORAL | Status: AC
  Administered 2024-10-07 (×2): 500 mg
  Filled 2024-10-07 (×2): qty 2

## 2024-10-07 MED ORDER — DEXTROSE-SODIUM CHLORIDE 5-0.45 % IV SOLN: INTRAVENOUS | Status: DC

## 2024-10-07 MED ORDER — NOREPINEPHRINE 4 MG/250ML-% IV SOLN
0.0000 ug/min | INTRAVENOUS | Status: DC
  Administered 2024-10-07: 4 ug/min via INTRAVENOUS
  Administered 2024-10-08: 2 ug/min via INTRAVENOUS
  Administered 2024-10-08: 3 ug/min via INTRAVENOUS
  Filled 2024-10-07: qty 250

## 2024-10-07 MED ORDER — FREE WATER
150.0000 mL | Status: DC
  Administered 2024-10-07 – 2024-10-10 (×18): 150 mL

## 2024-10-07 MED ORDER — MIDAZOLAM HCL (PF) 2 MG/2ML IJ SOLN
1.0000 mg | INTRAMUSCULAR | Status: DC | PRN
  Administered 2024-10-07 (×2): 1 mg via INTRAVENOUS
  Administered 2024-10-08 (×2): 2 mg via INTRAVENOUS
  Filled 2024-10-07 (×4): qty 2

## 2024-10-07 MED ORDER — ENOXAPARIN SODIUM 40 MG/0.4ML IJ SOSY
40.0000 mg | PREFILLED_SYRINGE | INTRAMUSCULAR | Status: DC
  Administered 2024-10-07 – 2024-10-17 (×11): 40 mg via SUBCUTANEOUS
  Filled 2024-10-07 (×11): qty 0.4

## 2024-10-07 NOTE — Progress Notes (Addendum)
 Initial Nutrition Assessment  DOCUMENTATION CODES:   Not applicable  INTERVENTION:  Initiate tube feeding via OGT: Osmolite 1.5 at 20 ml/h and increase by 10 ml every 8 hours until goal of 55 ml/hr (1320 ml per day) Prosource TF20 60 ml BID 150 ml FWF Q4H   Provides 2140 kcal, 122 gm protein, 1005 ml free water daily (1905 ml water daily TF+ FWF)   Continue high dose Thiamine  supplementation  MVI with minerals daily  Monitor magnesium , potassium, and phosphorus daily for at least 3 days, MD to replete as needed, as pt is at risk for refeeding syndrome   NUTRITION DIAGNOSIS:   Inadequate oral intake related to acute illness as evidenced by NPO status.  GOAL:   Patient will meet greater than or equal to 90% of their needs  MONITOR:   TF tolerance, Diet advancement, I & O's, Vent status, Labs, Weight trends  REASON FOR ASSESSMENT:   Consult Enteral/tube feeding initiation and management  ASSESSMENT:   73 year old man w/ hx of alcoholic dementia, DM2, HTN, HLD, CKD 3b, from Group home who presented with sudden onset AMS and unresponsiveness. Workup revealed septic shock, hypoglycemia, hypotensions, pyuria, bilateral pyelo and presumed septic encephalopathy.  11/8 - Admitted to ICU, intubated 11/9- Failed SBT  RD working remotely. Patient is currently intubated on ventilator support. Unknown po intake PTA. Limited weight history available in the last year. Last 2 weights recorded seem to be carried over from previous visits. Unable to evaluate weight status currently. Reached out to RN to provide updated weight.   Consult received to initiate tube feeds as pt has been NPO x 2 days. MD has been trying to wean sedation, pt still somnolent, not responsive, requires prompting to breathe. Not following commands. Failed SBT today. Off pressors for now. May transfer to [facility]. Pt at refeeding risk, monitor lytes, add Thiamine , titrate tube feeds to goal.   Patient is currently  intubated on ventilator support MV: 7.7 L/min Temp (24hrs), Avg:96.6 F (35.9 C), Min:94.5 F (34.7 C), Max:97.8 F (36.6 C) MAP (cuff): 101 mmHg  Admit weight: 100.7 kg  Current weight: 100.7 kg Wt Readings from Last 10 Encounters:  10/05/24 100.7 kg  07/30/24 100.7 kg  07/13/22 100.7 kg  02/05/21 111.1 kg  08/13/19 95.3 kg  07/06/19 91.4 kg  12/04/18 90.7 kg  09/28/18 102.5 kg  09/25/18 91.6 kg  09/04/18 102.5 kg     Intake/Output Summary (Last 24 hours) at 10/07/2024 1538 Last data filed at 10/07/2024 1525 Gross per 24 hour  Intake 2581.72 ml  Output 1425 ml  Net 1156.72 ml   Net IO Since Admission: 5,135.56 mL [10/07/24 1538]  Average Meal Intake: NPO  Nutritionally Relevant Medications: Scheduled Meds:  feeding supplement (PROSource TF20)  60 mL Per Tube BID   free water  150 mL Per Tube Q4H   insulin  aspart  0-9 Units Subcutaneous Q4H   multivitamin with minerals  1 tablet Per Tube Daily   pantoprazole  (PROTONIX ) IV  40 mg Intravenous QHS   phosphorus  500 mg Per Tube BID   polyethylene glycol  17 g Per Tube Daily   [START ON 10/08/2024] thiamine  (VITAMIN B1) injection  100 mg Intravenous Q24H   Continuous Infusions:  dextrose  5 % and 0.45 % NaCl 75 mL/hr at 10/07/24 1525   feeding supplement (OSMOLITE 1.5 CAL)     fentaNYL infusion INTRAVENOUS 50 mcg/hr (10/07/24 1525)   meropenem (MERREM) IV     thiamine  (VITAMIN  B1) injection Stopped (10/07/24 1441)   valproate sodium Stopped (10/07/24 1329)   Labs Reviewed: BUN 26 Creatinine 1.89 Phosphorus 2.4 AP 183 AST 43 GFR 37 CBG ranges from 47-245 mg/dL over the last 24 hours HgbA1c 9.6  NUTRITION - FOCUSED PHYSICAL EXAM: - Deferred to follow up   Diet Order:   Diet Order             Diet NPO time specified  Diet effective now                   EDUCATION NEEDS:   Not appropriate for education at this time  Skin:  Skin Assessment: Reviewed RN Assessment  Last BM:  PTA  Height:    Ht Readings from Last 1 Encounters:  10/06/24 6' 2 (1.88 m)    Weight:   Wt Readings from Last 1 Encounters:  10/05/24 100.7 kg    Ideal Body Weight:  86.4 kg  BMI:  Body mass index is 28.5 kg/m.  Estimated Nutritional Needs:   Kcal:  2100-2300 kcal  Protein:  100-120 gm  Fluid:  >2L/day   Jeffery [name], RD Registered Dietitian  See Amion for more information

## 2024-10-07 NOTE — Progress Notes (Signed)
 [facility] PROGRESS NOTE  BOWYN MERCIER (DOB: May 06, 1951) FMW:[id number] PCP: Sheryle Carwin, MD  Brief Narrative: Jeffery [name] is a 73 y.o. male with a history of dementia, T2DM, HTN, stage IIIb CKD, thrombocytopenia who presented to the ED on 10/05/2024 from group home due to unresponsiveness. He was found to have lactic acidosis, pyuria, hypoglycemia, hypotension despite IVF resuscitation and initiation of antibiotics that would cover ESBL (due to hx of same), as well as hypernatremia, hyperkalemia, and he's remained encephalopathic. Admitted to ICU on BiPAP, full code, on NE gtt peripherally, D10, adding stress steroids. CCM consulted. Due to failure to protect airway and increasing secretions, patient was intubated 11/8, has failed SBT 11/9  Subjective: Not responsive. With lightening sedation (was put on propofol overnight), withdraws to discomfort.   Objective: BP (!) 155/84   Pulse (!) 39   Temp (!) 94.5 F (34.7 C) (Rectal)   Resp (!) 9   Ht 6' 2 (1.88 m)   Wt 100.7 kg   SpO2 100%   BMI 28.50 kg/m   Gen: Elderly male sedated on ventilator Pulm: No wheezes or crackles, minimal secretions  CV: Regular bradycardia, no MRG or edema GI: Soft, NT, ND, +BS Neuro: Unresponsive Ext: Warm, no deformities. Warm fingers and toes Skin: No rashes, lesions or ulcers on visualized skin   Assessment & Plan: Septic shock suspected to be due to Proteus UTI, hx ESBL:  - Cx now w/ Proteus mirabilis, bilateral pyelo noted on scan, will DC vancomycin, continue meropenem for now, continue monitoring cultures - Has durably weaned from pressors, will DC hydrocortisone. Lactic acid cleared.  Report of GI bleeding at facility, normocytic anemia: Reported by family, had plans for GI outpatient evaluation this coming week. Hgb is down dilutionally initially and now stable. No BM since arrival. No bleeding radiographically noted either.  - Will trend CBC, thinking VTE ppx is still warranted in  absence of actual bleeding.   Acute metabolic encephalopathy on chronic dementia: Dementia since 2018, recently transitioned to care home. Due to secretions and failure to maintain airway, s/p ETT 11/8, not waking up on WUA/SBT.  - TSH wnl - CT head nonacute - Hx seizure disorder, so restarted his AED. Needs EEG, unavailable here over weekend. Also needs MRI brain which is not possible at [facility] apparently. D/w Dr. Claudene who will take the patient in transfer at [facility].  - Alkalotic > continue decreasing RR.  Demand myocardial ischemia: A limited echo showed preserved LVEF with no regional wall motion abnormalities.   Hypernatremia, dehydration:  - Start tube feeds per RD.  Hyperkalemia: Resolved.   AKI on stage IIIb CKD: Improved with volume resuscitation - Continue IVF until TF and free water initiated.  - DC vancomycin now that we have culprit (Proteus on UCx)  T2DM on insulin , with hypoglycemia:  - Continue SSI, now that CBGs trending upward, DC the D10 gtt, may need to augment novolog .  Bernardino KATHEE Come, MD [facility] [email or web address] 10/07/2024, 12:53 PM

## 2024-10-07 NOTE — Progress Notes (Signed)
 Transported patient to MRI while patient was on the mechanical ventilator. Patient remained stable during transport.

## 2024-10-07 NOTE — Progress Notes (Addendum)
 TELE-PCCM CONSULT This consult was provided via telemedicine with 2-way video and audio communication.   Physician location [facility]. Patient location [facility]  NAME:  Jeffery [name], MRN:  [id number], DOB:  03/31/1951, LOS: 2 ADMISSION DATE:  10/05/2024, CONSULTATION DATE:  10/06/24 REFERRING MD:  Bryn, CHIEF COMPLAINT:  AMS   History of Present Illness:  73 year old man w/ hx of alcoholic dementia, DM2, HTN, HLD p/w sudden onset AMS.  Workup revealed septic shock, pyuria, bilateral pyelo and presumed septic encephalopathy. CT head neg.  Unfortunately very somnolent with high secretion burden so PCCM consulted.  Pertinent  Medical History   Past Medical History:  Diagnosis Date   Cancer [facility])    prostate   Cervical spondylosis    Dementia associated with alcoholism (HCC)    from pt's neurology notes   Diabetes mellitus, type II (HCC)    Glaucoma    Hematuria    radiation cystitis   History of migraine headaches    Hx of arthroscopic knee surgery    Left knee   Hyperlipidemia    Hypertension    Hypertension    Hypertension    Impaired fasting glucose    Memory loss    Progressive gait disorder    Renal disorder    kidney stone   Varicose veins      Significant Hospital Events: Including procedures, antibiotic start and stop dates in addition to other pertinent events     Interim History / Subjective:  Still somnolent on sedation weans, requires prompting to breathe Not following commands  Patient Lines/Drains/Airways Status     Active Line/Drains/Airways     Name Placement date Placement time Site Days   Peripheral IV 10/05/24 20 G Posterior;Right Wrist 10/05/24  1939  Wrist  2   Peripheral IV 10/05/24 20 G Left;Posterior Hand 10/05/24  2249  Hand  2   Peripheral IV 10/06/24 22 G 1 Left;Posterior Forearm 10/06/24  0157  Forearm  1   Peripheral IV 10/06/24 22 G Anterior;Left Forearm 10/06/24  1500  Forearm  1   NG/OG Vented/Dual Lumen  16 Fr. Oral 10/06/24  1142  Oral  1   Urethral Catheter Olivia RN 14 Fr. 10/06/24  1143  --  1   Airway 7.5 mm 10/06/24  1135  -- 1            dextrose  5 % and 0.45 % NaCl 75 mL/hr at 10/07/24 1239   fentaNYL  infusion INTRAVENOUS Stopped (10/07/24 1128)   meropenem  (MERREM ) IV Stopped (10/07/24 0603)   norepinephrine  (LEVOPHED ) Adult infusion Stopped (10/06/24 1708)   thiamine  (VITAMIN B1) injection Stopped (10/07/24 0645)   valproate sodium  52.5 mL/hr at 10/07/24 1239   vancomycin  1,750 mg (10/07/24 0327)     Objective   Blood pressure (!) 155/84, pulse (!) 39, temperature (!) 94.5 F (34.7 C), temperature source Rectal, resp. rate (!) 9, height 6' 2 (1.88 m), weight 100.7 kg, SpO2 100%.    Vent Mode: PRVC FiO2 (%):  [30 %-40 %] 30 % Set Rate:  [12 bmp-20 bmp] 12 bmp Vt Set:  [660 mL] 660 mL PEEP:  [5 cmH20] 5 cmH20 Plateau Pressure:  [16 cmH20-18 cmH20] 16 cmH20   Intake/Output Summary (Last 24 hours) at 10/07/2024 1258 Last data filed at 10/07/2024 1247 Gross per 24 hour  Intake 2910.72 ml  Output 1425 ml  Net 1485.72 ml   Filed Weights   10/05/24 1938  Weight: 100.7 kg  Examination: Intubated Riding vent, mechanics look fine Does not appear awake  Lab/imaging review: Mild thrombocytopenia a little lower Bibasilar consolidation, bilateral pyelo CXR clear Trace AGMA when corrected for albumin ABG reviewed  Resolved Hospital Problem list   N/A  Assessment & Plan:  AMS, inability to protect airway Encephalopathy- ABG okay, ammonia okay, septic vs. Subclinical status Bilateral pyelo Likely aspiration pneumonitis Alcoholic dementia Mild thrombocytopenia- keep eye on  - Empiric abx, f/u culture data - Vent bundle - DVT ppx - Needs EEG and MRI: may need to transfer to expedite, will d/w primary - Empiric high dose thiamine  - Need to walk down mandatory ventilation, currently alkalotic, d/w RT and primary  PPI for GI ppx Lovenox  dvt ppx  31 min cc  time Rolan Sharps MD PCCM

## 2024-10-07 NOTE — Progress Notes (Signed)
 Attempted weaning without leaving patients bedside. At first he did not breath over the 10 delivered breaths unless actively being stimulated. After we left him alone for about five minutes he became irritated and was breathing 18-19 times a min. Then he relaxed again and stayed on the set rate of 10. I decided to attempt SBT without leaving patient to see if he would wake up more and have more effort. He would for a bit and then become apneic again and the back up mode would come on. We would stimulate him to breathe and he would for a minute or two and would then become apneic again. He is now back on full support ventilation with a set RR of 12 due to his ABG results. RT on standby.

## 2024-10-07 NOTE — Progress Notes (Signed)
 Brief PCCM progress note  See formal consult note for details but this patient is a 73 year old male presented to [facility] with altered mental status unfortunately became progressively altered with inability to protect airway 11/8 prompting urgent intubation.  After securing airway patient underwent CT abdomen and pelvis which confirmed bilateral pyelonephritis with cystitis, urine culture positive for Proteus.  Attempts made to lighten sedation 11/9 but mentation remained poor which prompted decision to transfer patient to [facility] for further neurological workup including MRI brain and EEG.  On arrival to [facility] patient remained stable off of vasopressors.  No signs of seizure activity on arrival. Will plan to get MRI when able and continue to monitor for need of EEG.   Tron Flythe D. Harris, NP-C  Pulmonary & Critical Care Personal contact information can be found on Amion  If no contact or response made please call [phone] 10/07/2024, 6:11 PM

## 2024-10-07 NOTE — Progress Notes (Signed)
 eLink Physician-Brief Progress Note Patient Name: Jeffery [name] DOB: 1951-02-08 MRN: [id number]   Date of Service  10/07/2024  HPI/Events of Note  Notified of hypothermia with temp at 35.2C.  Pt is on levophed, fluids and fentanyl gtt.   eICU Interventions  Ok to apply bair hugger.      Intervention Category Intermediate Interventions: Other:  Laruth Hanger 10/07/2024, 7:50 PM  3:19 AM Labs reviewed - VBG 7.4/34/86. Cea rising up to 2.02 from 1.65.  CXR - small basilar atelectasis.   Plan> Continue current management.

## 2024-10-08 ENCOUNTER — Inpatient Hospital Stay (HOSPITAL_COMMUNITY)

## 2024-10-08 DIAGNOSIS — N16 Renal tubulo-interstitial disorders in diseases classified elsewhere: Secondary | ICD-10-CM

## 2024-10-08 DIAGNOSIS — A419 Sepsis, unspecified organism: Secondary | ICD-10-CM | POA: Diagnosis not present

## 2024-10-08 DIAGNOSIS — J9601 Acute respiratory failure with hypoxia: Secondary | ICD-10-CM | POA: Diagnosis not present

## 2024-10-08 DIAGNOSIS — N12 Tubulo-interstitial nephritis, not specified as acute or chronic: Secondary | ICD-10-CM | POA: Diagnosis not present

## 2024-10-08 DIAGNOSIS — G9341 Metabolic encephalopathy: Secondary | ICD-10-CM | POA: Diagnosis not present

## 2024-10-08 DIAGNOSIS — Z7189 Other specified counseling: Secondary | ICD-10-CM

## 2024-10-08 DIAGNOSIS — R6521 Severe sepsis with septic shock: Secondary | ICD-10-CM | POA: Diagnosis not present

## 2024-10-08 DIAGNOSIS — Z515 Encounter for palliative care: Secondary | ICD-10-CM

## 2024-10-08 DIAGNOSIS — R8281 Pyuria: Secondary | ICD-10-CM

## 2024-10-08 LAB — CBC WITH DIFFERENTIAL/PLATELET
Abs Immature Granulocytes: 0.07 K/uL (ref 0.00–0.07)
Basophils Absolute: 0.1 K/uL (ref 0.0–0.1)
Basophils Relative: 1 %
Eosinophils Absolute: 0 K/uL (ref 0.0–0.5)
Eosinophils Relative: 0 %
HCT: 31.2 % — ABNORMAL LOW (ref 39.0–52.0)
Hemoglobin: 10.5 g/dL — ABNORMAL LOW (ref 13.0–17.0)
Immature Granulocytes: 1 %
Lymphocytes Relative: 20 %
Lymphs Abs: 2.4 K/uL (ref 0.7–4.0)
MCH: 31.3 pg (ref 26.0–34.0)
MCHC: 33.7 g/dL (ref 30.0–36.0)
MCV: 92.9 fL (ref 80.0–100.0)
Monocytes Absolute: 1.4 K/uL — ABNORMAL HIGH (ref 0.1–1.0)
Monocytes Relative: 11 %
Neutro Abs: 8.3 K/uL — ABNORMAL HIGH (ref 1.7–7.7)
Neutrophils Relative %: 67 %
Platelets: 92 K/uL — ABNORMAL LOW (ref 150–400)
RBC: 3.36 MIL/uL — ABNORMAL LOW (ref 4.22–5.81)
RDW: 15.3 % (ref 11.5–15.5)
Smear Review: NORMAL
WBC: 12.1 K/uL — ABNORMAL HIGH (ref 4.0–10.5)
nRBC: 0 % (ref 0.0–0.2)

## 2024-10-08 LAB — BASIC METABOLIC PANEL WITH GFR
Anion gap: 12 (ref 5–15)
BUN: 28 mg/dL — ABNORMAL HIGH (ref 8–23)
CO2: 18 mmol/L — ABNORMAL LOW (ref 22–32)
Calcium: 8.1 mg/dL — ABNORMAL LOW (ref 8.9–10.3)
Chloride: 110 mmol/L (ref 98–111)
Creatinine, Ser: 2.05 mg/dL — ABNORMAL HIGH (ref 0.61–1.24)
GFR, Estimated: 33 mL/min — ABNORMAL LOW (ref >60)
Glucose, Bld: 282 mg/dL — ABNORMAL HIGH (ref 70–99)
Potassium: 4.2 mmol/L (ref 3.5–5.1)
Sodium: 140 mmol/L (ref 135–145)

## 2024-10-08 LAB — GLUCOSE, CAPILLARY
Glucose-Capillary: 106 mg/dL — ABNORMAL HIGH (ref 70–99)
Glucose-Capillary: 164 mg/dL — ABNORMAL HIGH (ref 70–99)
Glucose-Capillary: 171 mg/dL — ABNORMAL HIGH (ref 70–99)
Glucose-Capillary: 207 mg/dL — ABNORMAL HIGH (ref 70–99)
Glucose-Capillary: 210 mg/dL — ABNORMAL HIGH (ref 70–99)
Glucose-Capillary: 219 mg/dL — ABNORMAL HIGH (ref 70–99)
Glucose-Capillary: 233 mg/dL — ABNORMAL HIGH (ref 70–99)

## 2024-10-08 LAB — URINE CULTURE: Culture: >=100000 — AB

## 2024-10-08 LAB — FOLATE: Folate: 5.1 ng/mL — ABNORMAL LOW (ref >5.9)

## 2024-10-08 LAB — VITAMIN B12: Vitamin B-12: 643 pg/mL (ref 180–914)

## 2024-10-08 MED ORDER — ORAL CARE MOUTH RINSE
15.0000 mL | OROMUCOSAL | Status: DC
  Administered 2024-10-08 – 2024-10-16 (×84): 15 mL via OROMUCOSAL

## 2024-10-08 MED ORDER — CEFAZOLIN SODIUM-DEXTROSE 2-4 GM/100ML-% IV SOLN
2.0000 g | Freq: Three times a day (TID) | INTRAVENOUS | Status: AC
  Administered 2024-10-08 – 2024-10-11 (×11): 2 g via INTRAVENOUS
  Filled 2024-10-08 (×12): qty 100

## 2024-10-08 MED ORDER — ORAL CARE MOUTH RINSE: 15.0000 mL | OROMUCOSAL | Status: DC | PRN

## 2024-10-08 MED ORDER — INSULIN GLARGINE-YFGN 100 UNIT/ML ~~LOC~~ SOLN
10.0000 [IU] | Freq: Every day | SUBCUTANEOUS | Status: DC
  Administered 2024-10-08 – 2024-10-09 (×2): 10 [IU] via SUBCUTANEOUS
  Filled 2024-10-08 (×3): qty 0.1

## 2024-10-08 MED ORDER — VALPROIC ACID 250 MG/5ML PO SOLN
500.0000 mg | Freq: Two times a day (BID) | ORAL | Status: DC
  Administered 2024-10-08 – 2024-10-10 (×4): 500 mg
  Filled 2024-10-08 (×4): qty 10

## 2024-10-08 MED ORDER — MIDAZOLAM HCL (PF) 2 MG/2ML IJ SOLN
2.0000 mg | INTRAMUSCULAR | Status: DC | PRN
Start: 2024-10-08 — End: 2024-10-09
  Administered 2024-10-08: 2 mg via INTRAVENOUS
  Filled 2024-10-08: qty 2

## 2024-10-08 MED ORDER — INSULIN ASPART 100 UNIT/ML IJ SOLN
0.0000 [IU] | INTRAMUSCULAR | Status: DC
  Administered 2024-10-08 (×2): 5 [IU] via SUBCUTANEOUS
  Administered 2024-10-09: 8 [IU] via SUBCUTANEOUS
  Administered 2024-10-09: 15 [IU] via SUBCUTANEOUS
  Administered 2024-10-09: 5 [IU] via SUBCUTANEOUS
  Administered 2024-10-09 (×2): 8 [IU] via SUBCUTANEOUS
  Administered 2024-10-09 (×2): 5 [IU] via SUBCUTANEOUS
  Administered 2024-10-10: 8 [IU] via SUBCUTANEOUS
  Administered 2024-10-10: 5 [IU] via SUBCUTANEOUS
  Administered 2024-10-10 (×2): 3 [IU] via SUBCUTANEOUS
  Administered 2024-10-10 – 2024-10-11 (×2): 2 [IU] via SUBCUTANEOUS
  Administered 2024-10-11: 8 [IU] via SUBCUTANEOUS
  Administered 2024-10-12: 5 [IU] via SUBCUTANEOUS
  Administered 2024-10-12: 3 [IU] via SUBCUTANEOUS
  Administered 2024-10-12: 5 [IU] via SUBCUTANEOUS
  Administered 2024-10-13: 3 [IU] via SUBCUTANEOUS
  Administered 2024-10-13: 5 [IU] via SUBCUTANEOUS
  Administered 2024-10-13 (×2): 3 [IU] via SUBCUTANEOUS
  Administered 2024-10-13: 8 [IU] via SUBCUTANEOUS
  Administered 2024-10-13: 5 [IU] via SUBCUTANEOUS
  Administered 2024-10-14: 3 [IU] via SUBCUTANEOUS
  Administered 2024-10-14: 5 [IU] via SUBCUTANEOUS
  Administered 2024-10-14 (×2): 8 [IU] via SUBCUTANEOUS
  Administered 2024-10-14 (×2): 5 [IU] via SUBCUTANEOUS
  Administered 2024-10-15: 3 [IU] via SUBCUTANEOUS
  Administered 2024-10-15: 5 [IU] via SUBCUTANEOUS
  Administered 2024-10-15: 8 [IU] via SUBCUTANEOUS
  Administered 2024-10-15 – 2024-10-17 (×7): 3 [IU] via SUBCUTANEOUS
  Filled 2024-10-08: qty 5
  Filled 2024-10-08: qty 3
  Filled 2024-10-08: qty 5
  Filled 2024-10-08: qty 3
  Filled 2024-10-08: qty 5
  Filled 2024-10-08: qty 8
  Filled 2024-10-08: qty 5
  Filled 2024-10-08: qty 2
  Filled 2024-10-08: qty 8
  Filled 2024-10-08: qty 5
  Filled 2024-10-08: qty 3
  Filled 2024-10-08: qty 8
  Filled 2024-10-08: qty 3
  Filled 2024-10-08: qty 5
  Filled 2024-10-08: qty 3
  Filled 2024-10-08: qty 8
  Filled 2024-10-08: qty 3
  Filled 2024-10-08: qty 5
  Filled 2024-10-08: qty 2
  Filled 2024-10-08: qty 8
  Filled 2024-10-08: qty 3
  Filled 2024-10-08 (×2): qty 5
  Filled 2024-10-08: qty 3
  Filled 2024-10-08: qty 8
  Filled 2024-10-08: qty 5
  Filled 2024-10-08: qty 3
  Filled 2024-10-08 (×2): qty 5
  Filled 2024-10-08: qty 8
  Filled 2024-10-08: qty 6
  Filled 2024-10-08: qty 5
  Filled 2024-10-08 (×2): qty 3
  Filled 2024-10-08: qty 8
  Filled 2024-10-08: qty 5
  Filled 2024-10-08: qty 8
  Filled 2024-10-08: qty 5
  Filled 2024-10-08 (×2): qty 3
  Filled 2024-10-08: qty 8

## 2024-10-08 NOTE — H&P (Signed)
 NAME:  Jeffery [name], MRN:  [id number], DOB:  07/09/1951, LOS: 3 ADMISSION DATE:  10/05/2024, CONSULTATION DATE:  10/08/24 REFERRING MD:  Bryn, MD CHIEF COMPLAINT:  Shock and AMS   History of Present Illness:  73 year old male with history of alcohol abuse, dementia, diabetes type 2, hypertension hyperlipidemia who presented with sudden altered mental status.  Workup revealed septic shock, pyuria, bilateral pyelonephritis presumed septic encephalopathy.  CT head negative.  Unfortunately very somnolent with high secretion burden so PCCM consulted via telemetry consult on 10/06/2024.  Due to need for EEG and MRI patient was transferred to [facility] and arrived overnight on 10/07/2024.  MRI completed.  With no signs of seizure activity EEG was held.  Pertinent  Medical History  alcohol abuse, dementia, diabetes type 2, hypertension hyperlipidemia  Significant Hospital Events: Including procedures, antibiotic start and stop dates in addition to other pertinent events   11/8 PCCM consulted at [facility] via televideo 11/9 Transferred to [facility] for MRI +/- EEG  Interim History / Subjective:   Remains intubated on minimal support Encephalopathic.  Possibly moving left toes on command Off pressors at 11 am Objective    Blood pressure (!) 110/57, pulse (!) 57, temperature 97.7 F (36.5 C), temperature source Rectal, resp. rate 14, height 6' 2 (1.88 m), weight 100.7 kg, SpO2 99%.    Vent Mode: PRVC FiO2 (%):  [30 %] 30 % Set Rate:  [12 bmp] 12 bmp Vt Set:  [660 mL] 660 mL PEEP:  [5 cmH20] 5 cmH20 Plateau Pressure:  [14 cmH20-16 cmH20] 16 cmH20   Intake/Output Summary (Last 24 hours) at 10/08/2024 1217 Last data filed at 10/08/2024 1200 Gross per 24 hour  Intake 4178.2 ml  Output 1025 ml  Net 3153.2 ml   Filed Weights   10/05/24 1938  Weight: 100.7 kg   Physical Exam: General: Chronically ill-appearing, no acute distress HENT: [place], AT, ETT in place Eyes: EOMI, no scleral  icterus Respiratory: Clear to auscultation bilaterally.  No crackles, wheezing or rales Cardiovascular: RRR, -M/R/G, no JVD GI: BS+, soft, nontender Extremities:-Edema,-tenderness Neuro: No tracking, PERRL 3 mm, minimally reactive, CNII-XII grossly intact, no clear response to commands but may be moving left toes  GU: Foley in place  Imaging, labs and test in EMR in the last 24 hours reviewed independently by me. Pertinent findings below: BUN/Cr 26/2.02 - worsening CO2 18 slightly decreasing WBC 12.1 worsening  MRI 10/07/24 - No acute intracranial abnormality, small 3 mm meningioma without mass effect CXR 10/07/24 Small left pleural effusion and atelectasis Echo 10/06/24  EF 55-60%, grade I DD CT [facility] 10/06/24 Acute pyelonephritis, bilateral subpleural consolidation, chronic pancreatitis CT head 10/05/24 NAICA  Resolved problem list   Assessment and Plan   Acute metabolic encephalopathy Dementia Hx alcohol abuse CT head 11/7 and MRI 11/9 unrevealing. No evidence of seizure. VPA level therapeutic and ammonia wnl. Unclear etiology. Electrolyte abnormalities would not explain severity of current presentation P: Holding sedating meds including Zyprexa Continue Depakote  Consult Neurology  Acute hypoxemic respiratory failure 2/2 above P: Full vent support LTVV, 4-8cc/kg IBW with goal Pplat<30 and DP<15 VAP PAD protocol  Septic shock secondary to pyelonephritis Hx ESBL UTI Admission cx + Proteus P: De-escalate to Ancef for 7 days total (end 11/13) Hold further fluids. Consider diuresis when able Trend LA  Acute kidney failure CFB+ P:  Monitor UOP/Cr  DM2 Hyperglycemia P: Semglee 10 SSI  GOC DNR  Labs   CBC: Recent Labs  Lab 10/05/24 1936 10/06/24 0355  10/07/24 0425 10/08/24 0251  WBC 5.0 5.8 11.2* 12.1*  NEUTROABS 2.9  --   --  8.3*  HGB 12.6* 10.9* 11.0* 10.5*  HCT 39.3 33.7* 32.4* 31.2*  MCV 96.3 95.7 93.1 92.9  PLT 94* 97* 80* 92*    Basic Metabolic  Panel: Recent Labs  Lab 10/06/24 0355 10/06/24 1015 10/06/24 1646 10/07/24 0425 10/07/24 1104 10/07/24 2015  NA 149* 147* 145  --  140 141  K 4.4 4.5 4.8  --  4.0 3.7  CL 117* 115* 112*  --  110 111  CO2 26 26 23   --  19* 18*  GLUCOSE 69* 56* 97  --  260* 169*  BUN 26* 24* 24*  --  26* 26*  CREATININE 1.45* 1.60* 1.65*  --  1.89* 2.02*  CALCIUM  8.2* 8.6* 9.0  --  8.7* 8.6*  MG 2.2  --   --  1.9  --   --   PHOS 2.5  --   --  2.4*  --   --    GFR: Estimated Creatinine Clearance: 40.7 mL/min (A) (by C-G formula based on SCr of 2.02 mg/dL (H)). Recent Labs  Lab 10/05/24 1936 10/05/24 2147 10/05/24 2355 10/06/24 0355 10/06/24 1155 10/06/24 1357 10/07/24 0425 10/08/24 0251  WBC 5.0  --   --  5.8  --   --  11.2* 12.1*  LATICACIDVEN  --  2.6* 2.7*  --  1.3 1.5  --   --     Liver Function Tests: Recent Labs  Lab 10/05/24 1936 10/06/24 0355 10/07/24 1104 10/07/24 2015  AST 59* 43* 43* 38  ALT 45* 34 32 31  ALKPHOS 211* 159* 183* 150*  BILITOT 0.5 0.2 0.7 0.7  PROT 7.1 5.6* 6.3* 6.2*  ALBUMIN 3.1* 2.5* 2.7* 2.1*   No results for input(s): LIPASE, AMYLASE in the last 168 hours. Recent Labs  Lab 10/06/24 0613 10/07/24 0425  AMMONIA 38* 33    ABG    Component Value Date/Time   PHART 7.54 (H) 10/07/2024 0800   PCO2ART 23 (L) 10/07/2024 0800   PO2ART 130 (H) 10/07/2024 0800   HCO3 21.4 10/07/2024 2015   TCO2 36 02/15/2017 2137   ACIDBASEDEF 3.4 (H) 10/07/2024 2015   O2SAT 96.3 10/07/2024 2015     Coagulation Profile: No results for input(s): INR, PROTIME in the last 168 hours.  Cardiac Enzymes: No results for input(s): CKTOTAL, CKMB, CKMBINDEX, TROPONINI in the last 168 hours.  HbA1C: Hgb A1c MFr Bld  Date/Time Value Ref Range Status  10/06/2024 03:55 AM 9.6 (H) 4.8 - 5.6 % Final    Comment:    (NOTE) Diagnosis of Diabetes The following HbA1c ranges recommended by the American Diabetes Association (ADA) may be used as an aid in the  diagnosis of diabetes mellitus.  Hemoglobin             Suggested A1C NGSP%              Diagnosis  <5.7                   Non Diabetic  5.7-6.4                Pre-Diabetic  >6.4                   Diabetic  <7.0                   Glycemic control for  adults with diabetes.    07/05/2019 03:38 PM 11.5 (H) 4.8 - 5.6 % Final    Comment:    (NOTE) Pre diabetes:          5.7%-6.4% Diabetes:              >6.4% Glycemic control for   <7.0% adults with diabetes     CBG: Recent Labs  Lab 10/07/24 1945 10/07/24 2318 10/08/24 0330 10/08/24 0755 10/08/24 1121  GLUCAP 151* 195* 171* 164* 207*    Review of Systems:   Unable to obtain due to critical condition  Past Medical History:  He,  has a past medical history of Cancer (HCC), Cervical spondylosis, Dementia associated with alcoholism (HCC), Diabetes mellitus, type II (HCC), Glaucoma, Hematuria, History of migraine headaches, arthroscopic knee surgery, Hyperlipidemia, Hypertension, Hypertension, Hypertension, Impaired fasting glucose, Memory loss, Progressive gait disorder, Renal disorder, and Varicose veins.   Surgical History:   Past Surgical History:  Procedure Laterality Date   CHOLECYSTECTOMY     EYE SURGERY     PROSTATE SURGERY       Social History:   reports that he has quit smoking. He has never used smokeless tobacco. He reports current alcohol use. He reports that he does not use drugs.   Family History:  His family history includes Arthritis in his father; Diabetes in his father; Fibromyalgia in his sister; Gait disorder in his maternal uncle; Glaucoma in his mother.   Allergies Allergies  Allergen Reactions   Penicillins Hives, Nausea And Vomiting and Swelling    Swelling of lips Has patient had a PCN reaction causing immediate rash, facial/tongue/throat swelling, SOB or lightheadedness with hypotension: Yes Has patient had a PCN reaction causing severe rash involving mucus  membranes or skin necrosis: No Has patient had a PCN reaction that required hospitalization No Has patient had a PCN reaction occurring within the last 10 years: No If all of the above answers are NO, then may proceed with Cephalosporin use.    Amlodipine  Swelling    Caused swelling in feet and legs   Atenolol Swelling and Other (See Comments)    Angioedema    Clonidine Derivatives Swelling and Other (See Comments)    Angioedema    Metformin  And Related     Irritable    Tape Rash    No plastic, patterned tape!!     Home Medications  Prior to Admission medications   Medication Sig Start Date End Date Taking? Authorizing Provider  aspirin  EC 81 MG EC tablet Take 1 tablet (81 mg total) by mouth daily. 07/09/19  Yes Ricky Fines, MD  atorvastatin  (LIPITOR) 10 MG tablet Take 10 mg by mouth daily. 09/21/24  Yes [provider]  divalproex  (DEPAKOTE ) 500 MG DR tablet Take 500 mg by mouth 2 (two) times daily. 09/21/24  Yes [provider]  donepezil  (ARICEPT ) 10 MG tablet Take one tablet at bedtime.  Please call [phone] to schedule an appt. Patient taking differently: Take 10 mg by mouth at bedtime. 06/19/19  Yes Gayland Lauraine PARAS, NP  glimepiride  (AMARYL ) 2 MG tablet Take 2 mg by mouth every morning. 04/16/19  Yes [provider]  Insulin  Glargine (BASAGLAR KWIKPEN) 100 UNIT/ML Inject 60 Units into the skin every morning.   Yes [provider]  insulin  lispro (HUMALOG ) 100 UNIT/ML injection Inject 4-8 Units into the skin 3 (three) times daily before meals. Sliding Scale: 250-300= 4 units 301-350= 6 units Over350=8 units   Yes [provider]  KLOR-CON  M20 20 MEQ tablet Take 20 mEq by mouth daily.  02/18/19  Yes [provider]  OLANZapine (ZYPREXA) 5 MG tablet Take 5 mg by mouth at bedtime. 09/21/24  Yes [provider]  simvastatin  (ZOCOR ) 20 MG tablet Take 20 mg by mouth at bedtime.   Yes [provider]   clotrimazole  (LOTRIMIN ) 1 % cream Apply to affected area 2 times daily Patient not taking: Reported on 10/06/2024 07/06/21   Arloa Suzen RAMAN, NP     Critical care time: 45 min    The patient is critically ill with multiple organ systems failure and requires high complexity decision making for assessment and support, frequent evaluation and titration of therapies, application of advanced monitoring technologies and extensive interpretation of multiple databases.  Independent Critical Care Time: 45 Minutes.   Slater Staff, M.D. [facility] Pulmonary/Critical Care Medicine 10/08/2024 12:53 PM   Please see Amion for pager number to reach on-call Pulmonary and Critical Care Team.

## 2024-10-08 NOTE — Progress Notes (Signed)
 EEG complete - results pending

## 2024-10-08 NOTE — Progress Notes (Signed)
 eLink Physician-Brief Progress Note Patient Name: Jeffery [name] DOB: 1951/08/11 MRN: [id number]   Date of Service  10/08/2024  HPI/Events of Note  Patient with sub-optimal sedation on the ventilator. He is a self-extubation risk.  eICU Interventions  PRN Versed and bilateral soft wrist restraints ordered.        Stefan Markarian U Jerame Hedding 10/08/2024, 7:53 PM

## 2024-10-08 NOTE — Progress Notes (Signed)
   10/08/24 0756  Daily Weaning Assessment  Daily Assessment of Readiness to Wean Wean protocol criteria met (SBT performed)  SBT Method CPAP 5 cm H20 and PS 5 cm H20  Weaning Start Time 0756  Patient response Failed SBT terminated  Reason SBT Terminated  (low MVE/RR)

## 2024-10-08 NOTE — Consult Note (Signed)
 NEUROLOGY CONSULT NOTE   Date of service: October 08, 2024 Patient Name: Jeffery [name] MRN:  [id number] DOB:  02-12-1951 Chief Complaint: AMS Requesting Provider: Kassie Acquanetta Bradley, MD  History of Present Illness  Jeffery [name] is a 73 y.o. male with hx of DM, HTN, HLD, alcohol abuse,  advanced dementia, glaucoma, prostate cancer, migraines who [facility]. Workup revealed septic shock, pyuria, bilateral pyelonephritis with presumed septic encephalopathy He was transferred to [facility] for further care and management. MRI brain with no acute process. Palliative care NP and RN at the bedside.  Palliative care NP informs me that per patients wife, patient has had a recent decline and is fully dependent on her for his care. Neurology consulted for AMS.     ROS  Comprehensive ROS Unable to ascertain due to AMS  Past History   Past Medical History:  Diagnosis Date   Cancer (HCC)    prostate   Cervical spondylosis    Dementia associated with alcoholism (HCC)    from pt's neurology notes   Diabetes mellitus, type II (HCC)    Glaucoma    Hematuria    radiation cystitis   History of migraine headaches    Hx of arthroscopic knee surgery    Left knee   Hyperlipidemia    Hypertension    Hypertension    Hypertension    Impaired fasting glucose    Memory loss    Progressive gait disorder    Renal disorder    kidney stone   Varicose veins     Past Surgical History:  Procedure Laterality Date   CHOLECYSTECTOMY     EYE SURGERY     PROSTATE SURGERY      Family History: Family History  Problem Relation Age of Onset   Glaucoma Mother    Arthritis Father    Diabetes Father    Fibromyalgia Sister    Gait disorder Maternal Uncle        progessive gait disorder    Social History  reports that he has quit smoking. He has never used smokeless tobacco. He reports current alcohol use. He reports that he does not use drugs.  Allergies  Allergen Reactions    Penicillins Hives, Nausea And Vomiting and Swelling    Swelling of lips Has patient had a PCN reaction causing immediate rash, facial/tongue/throat swelling, SOB or lightheadedness with hypotension: Yes Has patient had a PCN reaction causing severe rash involving mucus membranes or skin necrosis: No Has patient had a PCN reaction that required hospitalization No Has patient had a PCN reaction occurring within the last 10 years: No If all of the above answers are NO, then may proceed with Cephalosporin use.    Amlodipine  Swelling    Caused swelling in feet and legs   Atenolol Swelling and Other (See Comments)    Angioedema    Clonidine Derivatives Swelling and Other (See Comments)    Angioedema    Metformin  And Related     Irritable    Tape Rash    No plastic, patterned tape!!    Medications   Current Facility-Administered Medications:    acetaminophen  (TYLENOL ) tablet 650 mg, 650 mg, Oral, Q6H PRN **OR** acetaminophen  (TYLENOL ) suppository 650 mg, 650 mg, Rectal, Q6H PRN, Adefeso, Oladapo, DO   ceFAZolin (ANCEF) IVPB 2g/100 mL premix, 2 g, Intravenous, Q8H, Kassie Acquanetta Bradley, MD, Last Rate: 200 mL/hr at 10/08/24 1308, 2 g at 10/08/24 1308   Chlorhexidine Gluconate Cloth  2 % PADS 6 each, 6 each, Topical, Q0600, Adefeso, Oladapo, DO, 6 each at 10/08/24 0459   docusate (COLACE) 50 MG/5ML liquid 100 mg, 100 mg, Per Tube, BID, Harris, Whitney D, NP, 100 mg at 10/08/24 9073   enoxaparin  (LOVENOX ) injection 40 mg, 40 mg, Subcutaneous, Q24H, Bryn Bernardino NOVAK, MD, 40 mg at 10/08/24 1307   feeding supplement (OSMOLITE 1.5 CAL) liquid 1,000 mL, 1,000 mL, Per Tube, Continuous, Grunz, Ryan B, MD, Last Rate: 30 mL/hr at 10/08/24 1200, Infusion Verify at 10/08/24 1200   feeding supplement (PROSource TF20) liquid 60 mL, 60 mL, Per Tube, BID, Bryn Bernardino NOVAK, MD, 60 mL at 10/08/24 0926   fentaNYL (SUBLIMAZE) bolus via infusion 25-100 mcg, 25-100 mcg, Intravenous, Q15 min PRN, Arloa Folks D, NP,  100 mcg at 10/08/24 0148   fentaNYL in NS (55mcg/ml) infusion-PREMIX, 0-400 mcg/hr, Intravenous, Continuous, Harris, Whitney D, NP, Last Rate: 2.5 mL/hr at 10/08/24 1258, 25 mcg/hr at 10/08/24 1258   free water 150 mL, 150 mL, Per Tube, Q4H, Bryn Bernardino B, MD, 150 mL at 10/08/24 1200   insulin  aspart (novoLOG ) injection 0-15 Units, 0-15 Units, Subcutaneous, Q4H, Kassie Acquanetta Bradley, MD   insulin  glargine-yfgn [facility]) injection 10 Units, 10 Units, Subcutaneous, Daily, Kassie Acquanetta Bradley, MD   multivitamin with minerals tablet 1 tablet, 1 tablet, Per Tube, Daily, Bryn Bernardino NOVAK, MD, 1 tablet at 10/08/24 0926   norepinephrine (LEVOPHED) 4mg  in (0.016 mg/mL) premix infusion, 0-10 mcg/min, Intravenous, Titrated, Harris, Whitney D, NP, Stopped at 10/08/24 1126   ondansetron  (ZOFRAN ) tablet 4 mg, 4 mg, Oral, Q6H PRN **OR** ondansetron  (ZOFRAN ) injection 4 mg, 4 mg, Intravenous, Q6H PRN, Adefeso, Oladapo, DO   Oral care mouth rinse, 15 mL, Mouth Rinse, Q2H, Claudene Toribio BROCKS, MD, 15 mL at 10/08/24 1200   Oral care mouth rinse, 15 mL, Mouth Rinse, PRN, Claudene Toribio BROCKS, MD   pantoprazole  (PROTONIX ) injection 40 mg, 40 mg, Intravenous, QHS, Claudene Toribio BROCKS, MD, 40 mg at 10/07/24 2107   polyethylene glycol (MIRALAX  / GLYCOLAX ) packet 17 g, 17 g, Per Tube, Daily, Harris, Whitney D, NP, 17 g at 10/08/24 0926   thiamine  (VITAMIN B1) 500 mg in sodium chloride  0.9 % 50 mL IVPB, 500 mg, Intravenous, Q8H, Stopped at 10/08/24 0644 **FOLLOWED BY** thiamine  (VITAMIN B1) injection 100 mg, 100 mg, Intravenous, Q24H, Claudene Toribio BROCKS, MD   valproic acid (DEPAKENE) 250 MG/5ML solution 500 mg, 500 mg, Per Tube, BID, Kassie Acquanetta Bradley, MD  Vitals   Vitals:   10/08/24 1230 10/08/24 1245 10/08/24 1300 10/08/24 1315  BP: (!) 109/53 (!) 118/58 118/69   Pulse: (!) 55 (!) 57 (!) 56 (!) 55  Resp: 15 14 12 17   Temp: 97.7 F (36.5 C) 97.9 F (36.6 C) 97.7 F (36.5 C) 97.7 F (36.5 C)  TempSrc:      SpO2: 100%  100% 100% 100%  Weight:      Height:        Body mass index is 28.5 kg/m.   Physical Exam   Constitutional: critically ill  Psych: Affect appropriate to situation.   Eyes: No scleral injection.   HENT: No OP obstruction.  ET tube in place  Head: Normocephalic.   Cardiovascular: Normal rate and regular rhythm.   Respiratory: Effort normal, non-labored breathing on ventilator  GI: Soft.  No distension. There is no tenderness.   Skin: WDI.    Neurologic Examination   Mental Status -  Patient intubated and on  25mcg of IV fentanyl. Eyes are closed, opens eyes to noxious stimuli. Does not follow commands.   Cranial Nerves II - XII - II - Visual field with bilateral blink to threat  III, IV, VI - Extraocular movements intact . V - Facial sensation intact bilaterally . VII - unable to assess due to ET tube  VIII - Hearing & vestibular intact bilaterally . X - Palate elevates symmetrically . XI - Chin turning & shoulder shrug intact bilaterally . XII - Tongue protrusion intact .  Motor Strength - moving bilateral uppers spontaneously and both with out drift. Right lower can wiggle toes, left leg withdrawal to noxious stimuli, both legs with drift unable to hold against gravity  Sensory -responds to noxious stimuli  Coordination - unable to assist  Gait and Station - deferred.  Labs/Imaging/Neurodiagnostic studies   CBC:  Recent Labs  Lab 11/04/24 1936 10/06/24 0355 10/07/24 0425 10/08/24 0251  WBC 5.0   < > 11.2* 12.1*  NEUTROABS 2.9  --   --  8.3*  HGB 12.6*   < > 11.0* 10.5*  HCT 39.3   < > 32.4* 31.2*  MCV 96.3   < > 93.1 92.9  PLT 94*   < > 80* 92*   < > = values in this interval not displayed.   Basic Metabolic Panel:  Lab Results  Component Value Date   NA 141 10/07/2024   K 3.7 10/07/2024   CO2 18 (L) 10/07/2024   GLUCOSE 169 (H) 10/07/2024   BUN 26 (H) 10/07/2024   CREATININE 2.02 (H) 10/07/2024   CALCIUM  8.6 (L) 10/07/2024   GFRNONAA 34 (L)  10/07/2024   GFRAA 59 (L) 07/06/2019   Lipid Panel:  Lab Results  Component Value Date   LDLCALC 99 07/06/2019   HgbA1c:  Lab Results  Component Value Date   HGBA1C 9.6 (H) 10/06/2024   Urine Drug Screen:     Component Value Date/Time   LABOPIA NEGATIVE 2024-11-04 2041   COCAINSCRNUR NEGATIVE 04-Nov-2024 2041   LABBENZ NEGATIVE 11-04-24 2041   AMPHETMU NEGATIVE 11/04/24 2041   THCU NEGATIVE November 04, 2024 2041   LABBARB NEGATIVE 11/04/24 2041    Alcohol Level     Component Value Date/Time   ETH 171 (H) 02/05/2021 2326   INR  Lab Results  Component Value Date   INR 1.1 07/05/2019   APTT  Lab Results  Component Value Date   APTT 30 07/05/2019   AED levels: No results found for: PHENYTOIN, ZONISAMIDE, LAMOTRIGINE, LEVETIRACETA  CT Head without contrast 11/04/24 No acute process  MRI Brain(Personally reviewed): 1. No acute intracranial abnormality. 2. Small (3 mm) extra-axial lesion along the right frontal convexity may represent a small meningioma without mass effect but is incompletely assessed without contrast. 3. 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 4. Cerebral atrophy.  TSH 3.280 Ammonia 38   ASSESSMENT   Jeffery [name] is a 73 y.o. male  DM, HTN, HLD, alcohol abuse,  advanced dementia, glaucoma, prostate cancer, migraines who [facility]. Workup revealed septic shock, pyuria, bilateral pyelonephritis with presumed septic encephalopathy   RECOMMENDATIONS  - Check B12 and folate and  thiamine   - Limit sedation as possible  - will check Routine EEG to evaluate for seizures  - Neurology will follow-up EEG, but if negative we will sign off ______________________________________________________________________  Signed, Karna DELENA Geralds, NP [facility]  I seen the patient reviewed the above note.  He has  been densely encephalopathic, but appears to have improved  this afternoon.  My suspicion is that this is encephalopathy in the setting of sepsis superimposed on underlying dementia.  An EEG is reasonable, but if no clear evidence of seizure then I would favor starting any type of treatment for that.  We will follow-up EEG, and be available as needed if negative.  Aisha Seals, MD [facility]   If 7pm- 7am, please page neurology on call as listed in AMION.

## 2024-10-08 NOTE — Consult Note (Addendum)
 Palliative Medicine Inpatient Consult Note  Consulting Provider: Bryn Bernardino NOVAK, MD   Reason for consult:   Palliative Care Consult Services Palliative Medicine Consult  Reason for Consult? Continue goals of care discussions. Dementia patient currently intubated   10/08/2024  HPI:  Per intake H&P --> 73 year old man w/ hx of alcoholic dementia, DM2, HTN, HLD p/w sudden onset AMS. Workup revealed septic shock, pyuria, bilateral pyelo and presumed septic encephalopathy. CT head neg. Somnolent with high secretion burden so PCCM consulted - intubated, has not passed SBT's.   Palliative care has been asked to support GOC conversations.   Clinical Assessment/Goals of Care:  *Please note that this is a verbal dictation therefore any spelling or grammatical errors are due to the Dragon Medical One system interpretation.  I have reviewed medical records including EPIC notes, labs and imaging, received report from bedside RN, assessed the patient who is lying in bed critically ill.    I called and spoke with patients wife, Merlynn this morning to further discuss diagnosis prognosis, GOC, EOL wishes, disposition and options.   I introduced Palliative Medicine as specialized medical care for people living with serious illness. It focuses on providing relief from the symptoms and stress of a serious illness. The goal is to improve quality of life for both the patient and the family.  Medical History Review and Understanding:  A review of Sebastian's history significant for type 2 diabetes, hypertension, prostate cancer, dementia, glaucoma, hyperlipidemia, & kidney stones was completed .  Social History:  Miraj is from [place], [place] .  He and his wife have been married for the past 52 years.  They share 1 son, 1 daughter, and 4 grandchildren.  He formally worked in [employer].  He enjoys bowling, going on long rides, and socializing.  He is a man of faith practicing within  Christianity.  Functional and Nutritional State:  Prior to previous hospitalization Atreyu was dependent upon his wife for B ADLs.    Kairyn has been an an elder care home for one month now.   He had over the last few weeks had a decline in his nutritional state.  His wife shares she would help him with everything with the exception of him being able to eat when prompted.  He was able to walk though recently developed more of a shuffling gait.  Advance Directives:  A detailed discussion was had today regarding advanced directives.  Lessie does not have established advance directives though his wife feels she has an idea of what his wishes would be if his health condition were to neglect to improve.  By [place]  law as his spouse she is his surrogate management consultant.  Code Status:  Concepts specific to code status, artifical feeding and hydration, continued IV antibiotics and rehospitalization was had.  The difference between a aggressive medical intervention path  and a palliative comfort care path for this patient at this time was had.   Encouraged patient/family to consider DNAR status understanding evidenced based poor outcomes in similar hospitalized patient, as the cause of arrest is likely associated with advanced chronic/terminal illness rather than an easily reversible acute cardiac event. I explained that DNAR does not change the medical plan and it only comes into effect after a person has arrested (died).  It is a protective measure to keep us  from harming the patient in their last moments of life.  Although patient's wife shares it is difficult she would not want to put Oluwaferanmi through  the trauma of chest compressions.  She has determined she would like to continue present measures but would like him to be no chest compressions.  Provided [employer] for [employer] booklet.   Discussion:  Patient's wife and I reviewed his decline over the past few months and how prevalent it  has appeared over the past few weeks.  Merlynn shares that she has had an increased need to help which was always the case but has enhanced more as of recently.  She expresses that she has her home health care burdens in the setting of receiving treatments for her breast cancer.  She shares that their 2 children are very supportive and understanding of Markail's progressive dementia.  Merlynn notes that Quinten has exemplified signs of progressing disease as noted by his worsening mentation, physical function, and nutritional state.  We reviewed the concern associated with declining nutritional status this is something which is often a precursor to poor health outcomes.  We reviewed the progressive nature of dementia and that unfortunately is not a curable disease and likely complications of the disease will lead to mortality.  The circumstances surrounding Domanik's hospitalization were discussed inclusive of him having more pronounced altered mental status.  We reviewed at this time that Primo is receiving treatment for presumed aspiration pneumonia as well as urinary infection.  We reviewed the effects that his alcoholism has likely had on the integrity of his brain and what to anticipate in the short and long-term.  Best case and worst-case scenarios were reviewed best case being we are able to wean Brenn off of ventilatory support and he will be more coherent as his baseline.  Worst-case scenarios being that we are unable to wean him off of ventilatory support and his clinical conditions worsens.  We reviewed what the alternative would look like inclusive of potential need to pursue comfort measures.  Discussed the importance of continued conversation with family and their  medical providers regarding overall plan of care and treatment options, ensuring decisions are within the context of the patients values and GOCs. _____________________________ Addendum:  Meeting held this afternoon with patients daughter,  Nishika, Dr. Toma, Dr. Kassie, and myself.   We reviewed the reasoning for Daeshawn's admission inclusive of septic shock from a UTI. He was transferred here for ongoing support from neurology in the event that he was having active seizures. Measures that have been pursued since hospitalization and possible outcomes.  As of presently Teagan has failed SBT's. Dr. Kassie has consulted neurology to determine if there is anything additional that may need to be done as right now his mental state is preclusive to safe extubation.  Dr. Kassie shares the plan to allow the next 24 hours to see how patient does and further explore if additional reversible conditions are present.   Nishika and I did discuss if no improvements are made the potential decisions she and her family may be faced with.   Add Time: 36  Decision Maker: Torrence,Joyce (Spouse): [phone] (Mobile)   SUMMARY OF RECOMMENDATIONS   DNAR - No chest compressions  Allowing time for outcomes  Open and honest conversations held in the setting of patients worsening dementia  Discussion with patients spouse of best case and worst case scenarios  Ongoing PMT support  Code Status/Advance Care Planning: DNAR  Palliative Prophylaxis:  Aspiration, Bowel Regimen, Delirium Protocol, Frequent Pain Assessment, Oral Care, Palliative Wound Care, and Turn Reposition  Additional Recommendations (Limitations, Scope, Preferences): Continue present measures  Psycho-social/Spiritual:  Desire for further Chaplaincy support: Yes Additional Recommendations: Education on dementia progression   Prognosis: Worrisome/Guarded at this time due to critical illness.  Discharge Planning: To be determined.   Vitals:   10/08/24 1030 10/08/24 1058  BP: (!) 123/55   Pulse: (!) 48 (!) 53  Resp: 14 14  Temp: (!) 97.5 F (36.4 C) 97.7 F (36.5 C)  SpO2: 100% 100%    Intake/Output Summary (Last 24 hours) at 10/08/2024 1101 Last data filed at  10/08/2024 1000 Gross per 24 hour  Intake 4223.37 ml  Output 1025 ml  Net 3198.37 ml   Last Weight  Most recent update: 10/05/2024  7:39 PM    Weight  100.7 kg (222 lb)            LABS: CBC:    Component Value Date/Time   WBC 12.1 (H) 10/08/2024 0251   HGB 10.5 (L) 10/08/2024 0251   HCT 31.2 (L) 10/08/2024 0251   PLT 92 (L) 10/08/2024 0251   MCV 92.9 10/08/2024 0251   NEUTROABS 8.3 (H) 10/08/2024 0251   LYMPHSABS 2.4 10/08/2024 0251   MONOABS 1.4 (H) 10/08/2024 0251   EOSABS 0.0 10/08/2024 0251   BASOSABS 0.1 10/08/2024 0251   Comprehensive Metabolic Panel:    Component Value Date/Time   NA 141 10/07/2024 2015   K 3.7 10/07/2024 2015   CL 111 10/07/2024 2015   CO2 18 (L) 10/07/2024 2015   BUN 26 (H) 10/07/2024 2015   CREATININE 2.02 (H) 10/07/2024 2015   GLUCOSE 169 (H) 10/07/2024 2015   CALCIUM  8.6 (L) 10/07/2024 2015   AST 38 10/07/2024 2015   ALT 31 10/07/2024 2015   ALKPHOS 150 (H) 10/07/2024 2015   BILITOT 0.7 10/07/2024 2015   PROT 6.2 (L) 10/07/2024 2015   ALBUMIN 2.1 (L) 10/07/2024 2015   Gen: Elderly African-American male critically ill in appearance HEENT: ETT, core track, dry mucous membranes CV: Regular rate and rhythm PULM: On mechanical ventilatory support ABD: soft, nontender EXT: No edema Neuro: Somnolent  PPS: 10%   This conversation/these recommendations were discussed with patient primary care team, Dr. Kassie ______________________________________________________ Rosaline Becton [facility] Palliative Medicine Team Team Cell Phone: [phone] Please utilize secure chat with additional questions, if there is no response within 30 minutes please call the above phone number  Total Time: 75 Billing based on MDM: High  Palliative Medicine Team providers are available by phone from 7am to 7pm daily and can be reached through the team cell phone.  Should this patient require assistance outside of these hours, please call the  patient's attending physician.

## 2024-10-08 NOTE — TOC Progression Note (Signed)
 Transition of Care [facility]) - Progression Note    Patient Details  Name: Jeffery [name] MRN: [id number] Date of Birth: Mar 18, 1951  [facility] CM/SW Contact  Lauraine FORBES Saa, LCSWA Phone Number: 10/08/2024, 11:15 AM  Clinical Narrative:     11:15 AM Per progressions, patient remains intubated with feeding tube. TOC will continue to follow.    Barriers to Discharge: Continued Medical Work up               Expected Discharge Plan and Services       Living arrangements for the past 2 months: Assisted Living Facility (Adult Care Home)                                       Social Drivers of Health (SDOH) Interventions SDOH Screenings   Food Insecurity: Patient Unable To Answer (10/06/2024)  Housing: Patient Unable To Answer (10/06/2024)  Transportation Needs: Patient Unable To Answer (10/06/2024)  Utilities: Patient Unable To Answer (10/06/2024)  Depression (PHQ2-9): Low Risk  (02/06/2021)  Social Connections: Patient Unable To Answer (10/06/2024)  Tobacco Use: Medium Risk (10/05/2024)    Readmission Risk Interventions    10/07/2024   10:01 AM 10/06/2024    8:24 AM  Readmission Risk Prevention Plan  Transportation Screening Complete Complete  HRI or Home Care Consult Complete Complete  Social Work Consult for Recovery Care Planning/Counseling Complete Complete  Palliative Care Screening Not Applicable Not Applicable  Medication Review Oceanographer) Complete Complete

## 2024-10-09 DIAGNOSIS — N12 Tubulo-interstitial nephritis, not specified as acute or chronic: Secondary | ICD-10-CM | POA: Diagnosis not present

## 2024-10-09 DIAGNOSIS — R569 Unspecified convulsions: Secondary | ICD-10-CM

## 2024-10-09 DIAGNOSIS — R4182 Altered mental status, unspecified: Secondary | ICD-10-CM | POA: Diagnosis not present

## 2024-10-09 DIAGNOSIS — A419 Sepsis, unspecified organism: Secondary | ICD-10-CM | POA: Diagnosis not present

## 2024-10-09 DIAGNOSIS — J9601 Acute respiratory failure with hypoxia: Secondary | ICD-10-CM | POA: Diagnosis not present

## 2024-10-09 DIAGNOSIS — G9341 Metabolic encephalopathy: Secondary | ICD-10-CM | POA: Diagnosis not present

## 2024-10-09 LAB — BASIC METABOLIC PANEL WITH GFR
Anion gap: 10 (ref 5–15)
BUN: 26 mg/dL — ABNORMAL HIGH (ref 8–23)
CO2: 22 mmol/L (ref 22–32)
Calcium: 7.9 mg/dL — ABNORMAL LOW (ref 8.9–10.3)
Chloride: 109 mmol/L (ref 98–111)
Creatinine, Ser: 1.76 mg/dL — ABNORMAL HIGH (ref 0.61–1.24)
GFR, Estimated: 40 mL/min — ABNORMAL LOW (ref >60)
Glucose, Bld: 186 mg/dL — ABNORMAL HIGH (ref 70–99)
Potassium: 3.9 mmol/L (ref 3.5–5.1)
Sodium: 141 mmol/L (ref 135–145)

## 2024-10-09 LAB — GLUCOSE, CAPILLARY
Glucose-Capillary: 207 mg/dL — ABNORMAL HIGH (ref 70–99)
Glucose-Capillary: 265 mg/dL — ABNORMAL HIGH (ref 70–99)
Glucose-Capillary: 248 mg/dL — ABNORMAL HIGH (ref 70–99)
Glucose-Capillary: 296 mg/dL — ABNORMAL HIGH (ref 70–99)
Glucose-Capillary: 292 mg/dL — ABNORMAL HIGH (ref 70–99)
Glucose-Capillary: 290 mg/dL — ABNORMAL HIGH (ref 70–99)

## 2024-10-09 LAB — CBC
HCT: 30.5 % — ABNORMAL LOW (ref 39.0–52.0)
Hemoglobin: 10.2 g/dL — ABNORMAL LOW (ref 13.0–17.0)
MCH: 31.4 pg (ref 26.0–34.0)
MCHC: 33.4 g/dL (ref 30.0–36.0)
MCV: 93.8 fL (ref 80.0–100.0)
Platelets: 89 K/uL — ABNORMAL LOW (ref 150–400)
RBC: 3.25 MIL/uL — ABNORMAL LOW (ref 4.22–5.81)
RDW: 15.5 % (ref 11.5–15.5)
WBC: 6.8 K/uL (ref 4.0–10.5)
nRBC: 0 % (ref 0.0–0.2)

## 2024-10-09 LAB — PHOSPHORUS: Phosphorus: 3 mg/dL (ref 2.5–4.6)

## 2024-10-09 LAB — LACTIC ACID, PLASMA: Lactic Acid, Venous: 1.2 mmol/L (ref 0.5–1.9)

## 2024-10-09 LAB — MAGNESIUM: Magnesium: 1.9 mg/dL (ref 1.7–2.4)

## 2024-10-09 MED ORDER — ASPIRIN 81 MG PO CHEW
81.0000 mg | CHEWABLE_TABLET | Freq: Every day | ORAL | Status: DC
  Administered 2024-10-09 – 2024-10-12 (×3): 81 mg
  Filled 2024-10-09 (×3): qty 1

## 2024-10-09 MED ORDER — INSULIN ASPART 100 UNIT/ML IJ SOLN
3.0000 [IU] | INTRAMUSCULAR | Status: DC
  Administered 2024-10-09 – 2024-10-10 (×4): 3 [IU] via SUBCUTANEOUS
  Filled 2024-10-09 (×3): qty 3

## 2024-10-09 MED ORDER — FUROSEMIDE 10 MG/ML IJ SOLN
40.0000 mg | Freq: Once | INTRAMUSCULAR | Status: AC
  Administered 2024-10-09: 40 mg via INTRAVENOUS
  Filled 2024-10-09: qty 4

## 2024-10-09 MED ORDER — ATORVASTATIN CALCIUM 10 MG PO TABS
10.0000 mg | ORAL_TABLET | Freq: Every day | ORAL | Status: DC
  Administered 2024-10-09 – 2024-10-12 (×3): 10 mg
  Filled 2024-10-09 (×3): qty 1

## 2024-10-09 MED ORDER — OLANZAPINE 10 MG IM SOLR
5.0000 mg | Freq: Once | INTRAMUSCULAR | Status: DC | PRN
  Filled 2024-10-09: qty 10

## 2024-10-09 NOTE — Progress Notes (Signed)
 NAME:  Jeffery [name], MRN:  [id number], DOB:  Oct 07, 1951, LOS: 4 ADMISSION DATE:  10/05/2024, CONSULTATION DATE:  10/08/24 REFERRING MD:  Bryn, MD CHIEF COMPLAINT:  Shock and AMS   History of Present Illness:  73 year old male with history of alcohol abuse, dementia, diabetes type 2, hypertension hyperlipidemia who presented with sudden altered mental status.  Workup revealed septic shock, pyuria, bilateral pyelonephritis presumed septic encephalopathy.  CT head negative.  Unfortunately very somnolent with high secretion burden so PCCM consulted via telemetry consult on 10/06/2024.  Due to need for EEG and MRI patient was transferred to [facility] and arrived overnight on 10/07/2024.  MRI completed.  With no signs of seizure activity EEG was held.  Pertinent  Medical History  alcohol abuse, dementia, diabetes type 2, hypertension hyperlipidemia  Significant Hospital Events: Including procedures, antibiotic start and stop dates in addition to other pertinent events   11/8 PCCM consulted at [facility] via televideo 11/9 Transferred to [facility] for MRI +/- EEG 11/10 Failed SBT, remains encephalopathic  Interim History / Subjective:   Tolerating SBT however unable to follow commands Remains encephalopathic Objective    Blood pressure 137/64, pulse 67, temperature 97.7 F (36.5 C), temperature source Oral, resp. rate 11, height 6' 2 (1.88 m), weight 100.7 kg, SpO2 100%.    Vent Mode: PSV;CPAP FiO2 (%):  [30 %] 30 % Set Rate:  [12 bmp] 12 bmp Vt Set:  [660 mL] 660 mL PEEP:  [5 cmH20] 5 cmH20 Pressure Support:  [5 cmH20] 5 cmH20 Plateau Pressure:  [14 cmH20-18 cmH20] 14 cmH20   Intake/Output Summary (Last 24 hours) at 10/09/2024 1353 Last data filed at 10/09/2024 1200 Gross per 24 hour  Intake 2659.63 ml  Output 1560 ml  Net 1099.63 ml   Filed Weights   10/05/24 1938  Weight: 100.7 kg   Physical Exam: General: Chronically ill-appearing, no acute distress HENT: [place], AT, ETT in place Eyes:  EOMI, no scleral icterus Respiratory: Clear to auscultation bilaterally.  No crackles, wheezing or rales Cardiovascular: RRR, -M/R/G, no JVD GI: BS+, soft, nontender Extremities:-Edema,-tenderness Neuro: Eyes open, CNII-XII grossly intact, spontaneously moving extremity x 4 GU: Foley in place  Imaging, labs and test in EMR in the last 24 hours reviewed independently by me. Pertinent findings below:  BUN/Cr 26/1.76 CO2 22 WBC 6.8  MRI 10/07/24 - No acute intracranial abnormality, small 3 mm meningioma without mass effect CXR 10/07/24 Small left pleural effusion and atelectasis Echo 10/06/24  EF 55-60%, grade I DD CT AP 10/06/24 Acute pyelonephritis, bilateral subpleural consolidation, chronic pancreatitis CT head 10/05/24 NAICA  Resolved problem list   Assessment and Plan   Acute metabolic encephalopathy Dementia Hx alcohol abuse CT head 11/7 and MRI 11/9 unrevealing. No evidence of seizure. VPA level therapeutic and ammonia wnl. Unclear etiology. Electrolyte abnormalities would not explain severity of current presentation. Suspect his he may be at baseline with his dementia P: Continue Depakote  Appreciate Neurology input. EEG neg PRN Zyprexa for agitation Holding aricept  due to bradycardia  Acute hypoxemic respiratory failure 2/2 above P: Full vent support LTVV, 4-8cc/kg IBW with goal Pplat<30 and DP<15 VAP PAD protocol Consider extubation after GOC with wife tomorrow  Septic shock secondary to pyelonephritis -resolved shock and lactic acidosis Hx ESBL UTI Admission cx + Proteus P: De-escalate to Ancef for 7 days total (end 11/13) Diurese  Acute kidney failure - improving CFB+ P:  Monitor UOP  DM2 Hyperglycemia P: Semglee 10 U nightly Schedule novolog  3U every 4 hours SSI  GOC DNR Palliative following. Will readdress GOC with wife on Wednesday (tomorrow)   Critical care time: 35 min   The patient is critically ill with multiple organ systems failure and  requires high complexity decision making for assessment and support, frequent evaluation and titration of therapies, application of advanced monitoring technologies and extensive interpretation of multiple databases.  Independent Critical Care Time: 35 Minutes.   Slater Staff, M.D. [facility] Pulmonary/Critical Care Medicine 10/09/2024 1:53 PM   Please see Amion for pager number to reach on-call Pulmonary and Critical Care Team.

## 2024-10-09 NOTE — Procedures (Signed)
 Patient Name: LINN GOETZE  MRN: [id number]  Epilepsy Attending: Arlin MALVA Krebs  Referring Physician/Provider: Waddell Karna LABOR, NP  Date: 10/08/2024 Duration: 22.06 mins  Patient history: 73 y.o. male who [facility]. EEG to evaluate for seizure  Level of alertness: Awake  AEDs during EEG study: None  Technical aspects: This EEG study was done with scalp electrodes positioned according to the 10-20 International system of electrode placement. Electrical activity was reviewed with band pass filter of 1-70Hz , sensitivity of 7 uV/mm, display speed of 34mm/sec with a 60Hz  notched filter applied as appropriate. EEG data were recorded continuously and digitally stored.  Video monitoring was available and reviewed as appropriate.  Description: EEG showed continuous generalized 3 to 6 Hz theta-delta slowing. Hyperventilation and photic stimulation were not performed.     ABNORMALITY - Continuous slow, generalized  IMPRESSION: This study is suggestive of generalized cerebral dysfunction (encephalopathy). No seizures or epileptiform discharges were seen throughout the recording.  Jessey Stehlin O Eleuterio Dollar

## 2024-10-09 NOTE — Inpatient Diabetes Management (Signed)
 Inpatient Diabetes Program Recommendations  AACE/ADA: New Consensus Statement on Inpatient Glycemic Control (2015)  Target Ranges:  Prepandial:   less than 140 mg/dL      Peak postprandial:   less than 180 mg/dL (1-2 hours)      Critically ill patients:  140 - 180 mg/dL   Lab Results  Component Value Date   GLUCAP 248 (H) 10/09/2024   HGBA1C 9.6 (H) 10/06/2024    Review of Glycemic Control  Latest Reference Range & Units 10/08/24 11:21 10/08/24 15:23 10/08/24 19:40 10/08/24 23:44 10/09/24 03:52 10/09/24 07:37 10/09/24 11:33  Glucose-Capillary 70 - 99 mg/dL 792 (H) 766 (H) 780 (H) 210 (H) 207 (H) 265 (H) 248 (H)  (H): Data is abnormally high  Diabetes history:  DM2 Outpatient Diabetes medications:  Basaglar 60 units every day, Humalog  4-8 units TID, Amaryl  2 mg QD Current orders for Inpatient glycemic control:  Semglee 10 units every day, Novolog  0-15 units TID, Osmolite @ 50 ml/hr  Inpatient Diabetes Program Recommendations:    Please consider:  Novolog  3 units Q4H tube feed coverage.  Hold if feeds are held or discontinued.    Thank you, Wyvonna Pinal, MSN, CDCES Diabetes Coordinator Inpatient Diabetes Program [phone] (team pager from 8a-5p)

## 2024-10-10 ENCOUNTER — Inpatient Hospital Stay (HOSPITAL_COMMUNITY)

## 2024-10-10 DIAGNOSIS — N12 Tubulo-interstitial nephritis, not specified as acute or chronic: Secondary | ICD-10-CM | POA: Diagnosis not present

## 2024-10-10 DIAGNOSIS — Z711 Person with feared health complaint in whom no diagnosis is made: Secondary | ICD-10-CM

## 2024-10-10 DIAGNOSIS — J9601 Acute respiratory failure with hypoxia: Secondary | ICD-10-CM | POA: Diagnosis not present

## 2024-10-10 DIAGNOSIS — G9341 Metabolic encephalopathy: Secondary | ICD-10-CM | POA: Diagnosis not present

## 2024-10-10 DIAGNOSIS — A419 Sepsis, unspecified organism: Secondary | ICD-10-CM | POA: Diagnosis not present

## 2024-10-10 LAB — CBC
HCT: 31.8 % — ABNORMAL LOW (ref 39.0–52.0)
Hemoglobin: 10.7 g/dL — ABNORMAL LOW (ref 13.0–17.0)
MCH: 31.4 pg (ref 26.0–34.0)
MCHC: 33.6 g/dL (ref 30.0–36.0)
MCV: 93.3 fL (ref 80.0–100.0)
Platelets: 97 K/uL — ABNORMAL LOW (ref 150–400)
RBC: 3.41 MIL/uL — ABNORMAL LOW (ref 4.22–5.81)
RDW: 15.1 % (ref 11.5–15.5)
WBC: 7.5 K/uL (ref 4.0–10.5)
nRBC: 0 % (ref 0.0–0.2)

## 2024-10-10 LAB — BASIC METABOLIC PANEL WITH GFR
Anion gap: 8 (ref 5–15)
BUN: 26 mg/dL — ABNORMAL HIGH (ref 8–23)
CO2: 29 mmol/L (ref 22–32)
Calcium: 8.3 mg/dL — ABNORMAL LOW (ref 8.9–10.3)
Chloride: 107 mmol/L (ref 98–111)
Creatinine, Ser: 1.73 mg/dL — ABNORMAL HIGH (ref 0.61–1.24)
GFR, Estimated: 41 mL/min — ABNORMAL LOW (ref >60)
Glucose, Bld: 193 mg/dL — ABNORMAL HIGH (ref 70–99)
Potassium: 4.2 mmol/L (ref 3.5–5.1)
Sodium: 144 mmol/L (ref 135–145)

## 2024-10-10 LAB — PHOSPHORUS: Phosphorus: 2.6 mg/dL (ref 2.5–4.6)

## 2024-10-10 LAB — MAGNESIUM: Magnesium: 2 mg/dL (ref 1.7–2.4)

## 2024-10-10 LAB — GLUCOSE, CAPILLARY
Glucose-Capillary: 170 mg/dL — ABNORMAL HIGH (ref 70–99)
Glucose-Capillary: 228 mg/dL — ABNORMAL HIGH (ref 70–99)
Glucose-Capillary: 290 mg/dL — ABNORMAL HIGH (ref 70–99)
Glucose-Capillary: 131 mg/dL — ABNORMAL HIGH (ref 70–99)
Glucose-Capillary: 172 mg/dL — ABNORMAL HIGH (ref 70–99)
Glucose-Capillary: 111 mg/dL — ABNORMAL HIGH (ref 70–99)

## 2024-10-10 MED ORDER — THIAMINE MONONITRATE 100 MG PO TABS
100.0000 mg | ORAL_TABLET | Freq: Every day | ORAL | Status: DC
  Administered 2024-10-10 – 2024-10-12 (×2): 100 mg
  Filled 2024-10-10 (×2): qty 1

## 2024-10-10 MED ORDER — VALPROATE SODIUM 100 MG/ML IV SOLN
500.0000 mg | Freq: Once | INTRAVENOUS | Status: AC
  Administered 2024-10-11: 500 mg via INTRAVENOUS
  Filled 2024-10-10: qty 5

## 2024-10-10 MED ORDER — INSULIN GLARGINE-YFGN 100 UNIT/ML ~~LOC~~ SOLN
18.0000 [IU] | Freq: Every day | SUBCUTANEOUS | Status: DC
  Administered 2024-10-10: 18 [IU] via SUBCUTANEOUS
  Filled 2024-10-10 (×3): qty 0.18

## 2024-10-10 MED ORDER — INSULIN ASPART 100 UNIT/ML IJ SOLN
5.0000 [IU] | INTRAMUSCULAR | Status: DC
  Administered 2024-10-10 – 2024-10-11 (×5): 5 [IU] via SUBCUTANEOUS
  Filled 2024-10-10 (×4): qty 5

## 2024-10-10 NOTE — Progress Notes (Signed)
   10/10/24 0815  Daily Weaning Assessment  Daily Assessment of Readiness to Wean Wean protocol criteria met (SBT performed)  SBT Method CPAP 5 cm H20 and PS 5 cm H20 (10/5)  Weaning Start Time 0815  Patient response Passed (Tolerated well)

## 2024-10-10 NOTE — Progress Notes (Signed)
 NAME:  Jeffery [name], MRN:  [id number], DOB:  [birth year], LOS: 5 ADMISSION DATE:  10/05/2024, CONSULTATION DATE:  10/08/24 REFERRING MD:  Bryn, MD CHIEF COMPLAINT:  Shock and AMS   History of Present Illness:  73 year old male with history of alcohol abuse, dementia, diabetes type 2, hypertension hyperlipidemia who presented with sudden altered mental status.  Workup revealed septic shock, pyuria, bilateral pyelonephritis presumed septic encephalopathy.  CT head negative.  Unfortunately very somnolent with high secretion burden so PCCM consulted via telemetry consult on 10/06/2024.  Due to need for EEG and MRI patient was transferred to [facility] and arrived overnight on 10/07/2024.  MRI completed.  With no signs of seizure activity EEG was held.  Pertinent  Medical History  alcohol abuse, dementia, diabetes type 2, hypertension hyperlipidemia  Significant Hospital Events: Including procedures, antibiotic start and stop dates in addition to other pertinent events   11/8 PCCM consulted at [facility] via televideo 11/9 Transferred to [facility] for MRI +/- EEG 11/10 Failed SBT, remains encephalopathic 11/11 Passed SBT  Interim History / Subjective:  Tolerating SBT yesterday and this morning  Objective    Blood pressure 125/76, pulse 67, temperature 99.6 F (37.6 C), temperature source Axillary, resp. rate 16, height 6' 2 (1.88 m), weight 100.7 kg, SpO2 98%.    Vent Mode: PSV;CPAP FiO2 (%):  [30 %] 30 % Set Rate:  [12 bmp] 12 bmp Vt Set:  [660 mL] 660 mL PEEP:  [5 cmH20] 5 cmH20 Pressure Support:  [5 cmH20] 5 cmH20 Plateau Pressure:  [14 cmH20-18 cmH20] 15 cmH20   Intake/Output Summary (Last 24 hours) at 10/10/2024 9171 Last data filed at 10/10/2024 0700 Gross per 24 hour  Intake 2517.98 ml  Output 3950 ml  Net -1432.02 ml   Filed Weights   10/05/24 1938  Weight: 100.7 kg   Physical Exam: General: Chronically ill-appearing, no acute distress HENT: [place], AT, ETT in place Eyes: EOMI, no  scleral icterus Respiratory: Clear to auscultation bilaterally.  No crackles, wheezing or rales Cardiovascular: RRR, -M/R/G, no JVD GI: BS+, soft, nontender Extremities:-Edema,-tenderness Neuro: Drowsy, eyes open, CNII-XII grossly intact, does not follow commands GU: External foley in place  Imaging, labs and test in EMR in the last 24 hours reviewed independently by me. Pertinent findings below: BUN/Cr 26/1.73 stable CO2 29 WBC 6.8  MRI 10/07/24 - No acute intracranial abnormality, small 3 mm meningioma without mass effect CXR 10/07/24 Small left pleural effusion and atelectasis Echo 10/06/24  EF 55-60%, grade I DD CT AP 10/06/24 Acute pyelonephritis, bilateral subpleural consolidation, chronic pancreatitis CT head 10/05/24 NAICA  Resolved problem list   Assessment and Plan   Acute metabolic encephalopathy Dementia Hx alcohol abuse CT head 11/7 and MRI 11/9 unrevealing. No evidence of seizure. VPA level therapeutic and ammonia wnl. Unclear etiology. Electrolyte abnormalities would not explain severity of current presentation. Suspect his he may be at baseline with his dementia P: Continue Depakote  Appreciate Neurology input. EEG neg PRN Zyprexa for agitation Holding aricept  due to bradycardia  Acute hypoxemic respiratory failure 2/2 above P: Full vent support LTVV, 4-8cc/kg IBW with goal Pplat<30 and DP<15 VAP PAD protocol Will discuss GOC with wife today regarding one way extubation  Septic shock secondary to pyelonephritis -resolved shock and lactic acidosis Hx ESBL UTI Admission cx + Proteus P: Ancef for 7 days total (end 11/13) Diurese  Acute kidney failure - improving CFB+ P:  Monitor UOP  DM2 Hyperglycemia P: Semglee 18 U nightly Schedule novolog  5U every 4  hours SSI  GOC DNR Palliative following. Will readdress GOC with wife on Wednesday (tomorrow)   Critical care time: 46 min   The patient is critically ill with multiple organ systems failure and  requires high complexity decision making for assessment and support, frequent evaluation and titration of therapies, application of advanced monitoring technologies and extensive interpretation of multiple databases.  Independent Critical Care Time: 46 Minutes.   Slater Staff, M.D. [facility] Pulmonary/Critical Care Medicine 10/10/2024 8:29 AM   Please see Amion for pager number to reach on-call Pulmonary and Critical Care Team.

## 2024-10-10 NOTE — Progress Notes (Signed)
 Palliative Medicine Inpatient Follow Up Note HPI: 73 year old man w/ hx of alcoholic dementia, DM2, HTN, HLD p/w sudden onset AMS. Workup revealed septic shock, pyuria, bilateral pyelo and presumed septic encephalopathy. CT head neg. Somnolent with high secretion burden so PCCM consulted - intubated, has not passed SBT's.    Palliative care has been asked to support GOC conversations.   Today's Discussion 10/10/2024  *Please note that this is a verbal dictation therefore any spelling or grammatical errors are due to the Dragon Medical One system interpretation.  Chart reviewed inclusive of vital signs, progress notes, laboratory results, and diagnostic images.   I met with Jeffery [name] at bedside. He remains intubated, on fentanyl with little response to vocalizations or touch this morning.   I have called patients spouse, Jeffery [name] regarding a follow up meeting. She plans to come to the hospital be tween 11-11:30 this morning.  _____________________________________  I met with patients spouse, Jeffery [name] this afternoon in the company of her daughter, Jeffery [name] on speaker-phone.  We discussed the progressiveness of Jeffery [name]'s dementia over the past few months and how it has become more difficult for Jeffery [name] to care for him. We reviewed that her grandson had moved in to support day to day care. Jeffery [name] shares how overwhelming it has all be with her breast cancer diagnosis in February of this year. Created space and opportunity for Jeffery [name] to explore thoughts feelings and fears regarding her spouses current medical situation. Allowed her time to grieve in my presence. Jeffery [name] shares she has not been alone in 52 years and does not know what to expect moving forward.  We reviewed that as of presently, Jeffery [name] has exemplified the ability to breath on his own. We are at a point where it is safe to extubate him. Jeffery [name] requests be fore this is done that her children to get see him which I shared we would honor.   We reviewed the  uncertainty of how things may transpire. On hope would be that Jeffery [name] does well and can potentially improve to the point where he can enjoy eating/drinking and mobilize again. We discussed that if he were to improve to the point where he could leave the hospital that Jeffery [name] is no longer able to care for him and that he would need a long term care facility.   We discussed if Jeffery [name] does well for a period of time then worsens we used the example of an aspirational event which would set him into respiratory failure. We discussed that he would NOT be re-intubated per family wishes and the emphasis at that time would shift towards that of comfort and utilizing medications to support symptom burden.   We alternatively discussed Jeffery [name] being extubated and doing poorly from that point on - the desire would then be to keep him comfortable with medications to alleviate symptoms burden.   Jeffery [name] was very tearful during my time with her. I was able to support her through reflective listening.   Questions and concerns addressed/Palliative Support Provided.   Objective Assessment: Vital Signs Vitals:   10/10/24 0815 10/10/24 0900  BP: 130/67 132/80  Pulse: 86 82  Resp: 15 12  Temp:    SpO2: 98% 99%    Intake/Output Summary (Last 24 hours) at 10/10/2024 0949 Last data filed at 10/10/2024 0800 Gross per 24 hour  Intake 2525.49 ml  Output 3950 ml  Net -1424.51 ml   Last Weight  Most recent update: 10/10/2024  9:48 AM    Weight  101.7 kg (224 lb 3.3 oz)            Gen: Elderly African-American male critically ill in appearance HEENT: ETT, OGT, dry mucous membranes CV: Regular rate and rhythm PULM: On mechanical ventilatory support ABD: soft, nontender EXT: No edema Neuro: Somnolent  SUMMARY OF RECOMMENDATIONS   DNAR  Plan for CCM to do a one way extubation once family has visited given that Taedyn has passed SBTs  Family does not desire re-intubation --> if Trace should go into respiratory  distress they would want to pursue keeping him comfortable  If Jaisean should fair well enough to transition in the future to rehab --> patients wife can no longer care for him therefore will need LTC  Ongoing PMT support  ______________________________________________________________________________________ Rosaline Becton [facility] Palliative Medicine Team Team Cell Phone: [phone] Please utilize secure chat with additional questions, if there is no response within 30 minutes please call the above phone number  Time Spent: 80 Billing based on MDM: High  Palliative Medicine Team providers are available by phone from 7am to 7pm daily and can be reached through the team cell phone.  Should this patient require assistance outside of these hours, please call the patient's attending physician.

## 2024-10-10 NOTE — TOC Progression Note (Signed)
 Transition of Care [facility]) - Progression Note    Patient Details  Name: Jeffery [name] MRN: [id number] Date of Birth: September 07, 1951  [facility] CM/SW Contact  Lauraine FORBES Saa, LCSWA Phone Number: 10/10/2024, 1:40 PM  Clinical Narrative:     1:40 PM PMT informed medical team that patient's family expressed preference in one way extubation and may transfer to comfort care after pending medical status. TOC will continue to follow.    Barriers to Discharge: Continued Medical Work up               Expected Discharge Plan and Services       Living arrangements for the past 2 months: Assisted Living Facility (Adult Care Home)                                       Social Drivers of Health (SDOH) Interventions SDOH Screenings   Food Insecurity: Patient Unable To Answer (10/06/2024)  Housing: Patient Unable To Answer (10/06/2024)  Transportation Needs: Patient Unable To Answer (10/06/2024)  Utilities: Patient Unable To Answer (10/06/2024)  Depression (PHQ2-9): Low Risk  (02/06/2021)  Social Connections: Patient Unable To Answer (10/06/2024)  Tobacco Use: Medium Risk (10/05/2024)    Readmission Risk Interventions    10/07/2024   10:01 AM 10/06/2024    8:24 AM  Readmission Risk Prevention Plan  Transportation Screening Complete Complete  HRI or Home Care Consult Complete Complete  Social Work Consult for Recovery Care Planning/Counseling Complete Complete  Palliative Care Screening Not Applicable Not Applicable  Medication Review Oceanographer) Complete Complete

## 2024-10-10 NOTE — Progress Notes (Signed)
 Nutrition Follow-up  DOCUMENTATION CODES:   Not applicable  INTERVENTION:  Continue tube feeding via OGT: Osmolite 1.5 at  55 ml/hr (1320 ml per day) Prosource TF20 60 ml BID 150 ml FWF Q4H   Provides 2140 kcal, 122 gm protein, 1005 ml free water daily (1905 ml water daily TF+ FWF)   Continue high dose Thiamine  supplementation  MVI with minerals daily    NUTRITION DIAGNOSIS:   Inadequate oral intake related to acute illness as evidenced by NPO status. - ongoing    GOAL:   Patient will meet greater than or equal to 90% of their needs - met with TF   MONITOR:   TF tolerance, Diet advancement, I & O's, Vent status, Labs, Weight trends  REASON FOR ASSESSMENT:   Consult Enteral/tube feeding initiation and management  ASSESSMENT:   73 year old man w/ hx of alcoholic dementia, DM2, HTN, HLD, CKD 3b, from Group home who presented with sudden onset AMS and unresponsiveness. Workup revealed septic shock, hypoglycemia, hypotensions, pyuria, bilateral pyelo and presumed septic encephalopathy.  11/8 - Admitted to ICU, intubated 11/9- Failed SBT  Patient seen in room, no family in room. Remains intubated and sedated. Osmolite 1.5 infusing via OGT at goal rate of 55 ml/hr. Per MD note, pt is tolerating SBT but not following commands. Palliative care following for GOC, will meet with spouse sometime today to discuss.   4075 ml UOP x 24 hrs   Patient is currently intubated on ventilator support MV: 7.7 L/min Temp (24hrs), Avg:98.3 F (36.8 C), Min:97.6 F (36.4 C), Max:99.6 F (37.6 C) MAP (cuff): 101 mmHg  Admit weight: 100.7 kg  Current weight: 101..7 kg    Intake/Output Summary (Last 24 hours) at 10/10/2024 1045 Last data filed at 10/10/2024 1000 Gross per 24 hour  Intake 2470.51 ml  Output 3825 ml  Net -1354.49 ml   Net IO Since Admission: 8,039.94 mL [10/10/24 1045]  Average Meal Intake: NPO  Nutritionally Relevant Medications:  docusate  100 mg Per Tube  BID   feeding supplement (PROSource TF20)  60 mL Per Tube BID   free water  150 mL Per Tube Q4H   insulin  aspart  0-15 Units Subcutaneous Q4H   insulin  aspart  5 Units Subcutaneous Q4H   insulin  glargine-yfgn  18 Units Subcutaneous Daily   multivitamin with minerals  1 tablet Per Tube Daily   pantoprazole  (PROTONIX ) IV  40 mg Intravenous QHS   polyethylene glycol  17 g Per Tube Daily   thiamine   100 mg Per Tube Daily     Continuous Infusions:   ceFAZolin (ANCEF) IV Stopped (10/10/24 0550)   feeding supplement (OSMOLITE 1.5 CAL) 55 mL/hr at 10/10/24 1000   fentaNYL infusion INTRAVENOUS 25 mcg/hr (10/10/24 1000)   Labs Reviewed: Glu 193, BUN 26, Cr 1.73, Calcium  8.3, GFR 41  NUTRITION - FOCUSED PHYSICAL EXAM: Flowsheet Row Most Recent Value  Orbital Region No depletion  Upper Arm Region No depletion  Thoracic and Lumbar Region No depletion  Buccal Region Unable to assess  [ETT]  Temple Region No depletion  Clavicle Bone Region No depletion  Clavicle and Acromion Bone Region Mild depletion  Scapular Bone Region No depletion  Dorsal Hand Unable to assess  [restraints/mittens]  Patellar Region No depletion  Anterior Thigh Region No depletion  Posterior Calf Region Unable to assess  [SCDs]  Hair Reviewed  Eyes Unable to assess  [sedated]  Mouth Unable to assess  [ETT]  Skin Reviewed  Nails Unable to  assess  [restraints/mittens]     Diet Order:   Diet Order             Diet NPO time specified  Diet effective now                   EDUCATION NEEDS:   Not appropriate for education at this time  Skin:  Skin Assessment: Reviewed RN Assessment  Last BM:  PTA  Height:   Ht Readings from Last 1 Encounters:  10/06/24 6' 2 (1.88 m)    Weight:   Wt Readings from Last 1 Encounters:  10/10/24 101.7 kg    Ideal Body Weight:  86.4 kg  BMI:  Body mass index is 28.79 kg/m.  Estimated Nutritional Needs:   Kcal:  2100-2300 kcal  Protein:  100-120  gm  Fluid:  >2L/day  Pearl Bents, MS, RD, LDN Clinical Dietitian  Contact via secure chat. If unavailable, use group chat RD Inpatient.

## 2024-10-10 NOTE — Progress Notes (Signed)
 eLink Physician-Brief Progress Note Patient Name: Jeffery [name] DOB: [birth year] MRN: [id number]   Date of Service  10/10/2024  HPI/Events of Note  OG tube was inadvertently placed in the right lower lobe of the lung and picked up by KUB prior to receiving anything enterally. OG tube discontinued.  eICU Interventions  Depakote  500 mg iv x 1 tonight. Other enterally delivered medications held over night.        Achol Azpeitia U Nihira Puello 10/10/2024, 11:22 PM

## 2024-10-11 DIAGNOSIS — N12 Tubulo-interstitial nephritis, not specified as acute or chronic: Secondary | ICD-10-CM | POA: Diagnosis not present

## 2024-10-11 DIAGNOSIS — A419 Sepsis, unspecified organism: Secondary | ICD-10-CM | POA: Diagnosis not present

## 2024-10-11 DIAGNOSIS — J9601 Acute respiratory failure with hypoxia: Secondary | ICD-10-CM | POA: Diagnosis not present

## 2024-10-11 DIAGNOSIS — G9341 Metabolic encephalopathy: Secondary | ICD-10-CM | POA: Diagnosis not present

## 2024-10-11 LAB — CBC
HCT: 32.7 % — ABNORMAL LOW (ref 39.0–52.0)
Hemoglobin: 11 g/dL — ABNORMAL LOW (ref 13.0–17.0)
MCH: 31.4 pg (ref 26.0–34.0)
MCHC: 33.6 g/dL (ref 30.0–36.0)
MCV: 93.4 fL (ref 80.0–100.0)
Platelets: 126 K/uL — ABNORMAL LOW (ref 150–400)
RBC: 3.5 MIL/uL — ABNORMAL LOW (ref 4.22–5.81)
RDW: 15 % (ref 11.5–15.5)
WBC: 10.1 K/uL (ref 4.0–10.5)
nRBC: 0 % (ref 0.0–0.2)

## 2024-10-11 LAB — GLUCOSE, CAPILLARY
Glucose-Capillary: 120 mg/dL — ABNORMAL HIGH (ref 70–99)
Glucose-Capillary: 134 mg/dL — ABNORMAL HIGH (ref 70–99)
Glucose-Capillary: 176 mg/dL — ABNORMAL HIGH (ref 70–99)
Glucose-Capillary: 246 mg/dL — ABNORMAL HIGH (ref 70–99)
Glucose-Capillary: 52 mg/dL — ABNORMAL LOW (ref 70–99)
Glucose-Capillary: 93 mg/dL (ref 70–99)

## 2024-10-11 LAB — CULTURE, BLOOD (ROUTINE X 2)
Culture: NO GROWTH
Culture: NO GROWTH
Special Requests: ADEQUATE
Special Requests: ADEQUATE

## 2024-10-11 LAB — BASIC METABOLIC PANEL WITH GFR
Anion gap: 9 (ref 5–15)
BUN: 23 mg/dL (ref 8–23)
CO2: 27 mmol/L (ref 22–32)
Calcium: 8.7 mg/dL — ABNORMAL LOW (ref 8.9–10.3)
Chloride: 107 mmol/L (ref 98–111)
Creatinine, Ser: 1.32 mg/dL — ABNORMAL HIGH (ref 0.61–1.24)
GFR, Estimated: 57 mL/min — ABNORMAL LOW (ref >60)
Glucose, Bld: 82 mg/dL (ref 70–99)
Potassium: 4 mmol/L (ref 3.5–5.1)
Sodium: 143 mmol/L (ref 135–145)

## 2024-10-11 LAB — MAGNESIUM: Magnesium: 2.3 mg/dL (ref 1.7–2.4)

## 2024-10-11 LAB — PHOSPHORUS: Phosphorus: 1.9 mg/dL — ABNORMAL LOW (ref 2.5–4.6)

## 2024-10-11 LAB — VITAMIN B1: Vitamin B1 (Thiamine): 350.7 nmol/L — ABNORMAL HIGH (ref 66.5–200.0)

## 2024-10-11 MED ORDER — FENTANYL CITRATE (PF) 50 MCG/ML IJ SOSY: 50.0000 ug | PREFILLED_SYRINGE | INTRAMUSCULAR | Status: DC | PRN

## 2024-10-11 MED ORDER — DEXTROSE 50 % IV SOLN
INTRAVENOUS | Status: AC
  Filled 2024-10-11: qty 50

## 2024-10-11 MED ORDER — DEXTROSE 50 % IV SOLN
25.0000 g | Freq: Once | INTRAVENOUS | Status: AC
Start: 2024-10-12 — End: 2024-10-11
  Administered 2024-10-11: 25 g via INTRAVENOUS

## 2024-10-11 MED ORDER — OLANZAPINE 10 MG IM SOLR
2.5000 mg | Freq: Once | INTRAMUSCULAR | Status: AC | PRN
  Administered 2024-10-12: 2.5 mg via INTRAMUSCULAR
  Filled 2024-10-11: qty 10

## 2024-10-11 MED ORDER — SODIUM PHOSPHATES 45 MMOLE/15ML IV SOLN
30.0000 mmol | Freq: Once | INTRAVENOUS | Status: AC
  Administered 2024-10-11: 30 mmol via INTRAVENOUS
  Filled 2024-10-11: qty 10

## 2024-10-11 MED ORDER — VALPROATE SODIUM 100 MG/ML IV SOLN
500.0000 mg | Freq: Two times a day (BID) | INTRAVENOUS | Status: DC
  Administered 2024-10-11 – 2024-10-12 (×3): 500 mg via INTRAVENOUS
  Filled 2024-10-11: qty 5
  Filled 2024-10-11: qty 500
  Filled 2024-10-11 (×2): qty 5
  Filled 2024-10-11: qty 500

## 2024-10-11 MED ORDER — FUROSEMIDE 10 MG/ML IJ SOLN
40.0000 mg | Freq: Once | INTRAMUSCULAR | Status: AC
  Administered 2024-10-11: 40 mg via INTRAVENOUS
  Filled 2024-10-11: qty 4

## 2024-10-11 NOTE — Progress Notes (Signed)
 Pt was placed on PSV/CPAP mode 8/+5. Pt had lack of respirations and was placed back on full support mode. Rn made aware.

## 2024-10-11 NOTE — Procedures (Signed)
 Extubation Procedure Note  Patient Details:   Name: Jeffery [name] DOB: 1951-07-15 MRN: [id number]   Airway Documentation:    Vent end date: 10/11/24 Vent end time: 1523   Evaluation  O2 sats: stable throughout Complications: No apparent complications Patient did tolerate procedure well. Bilateral Breath Sounds: Clear   Yes  Pt was extubated per MD order and placed on 3 L [place]. Cuff leak was noted prior to extubation and no stridor post. RT will monitor.  Navarro Nine 10/11/2024, 3:40 PM

## 2024-10-11 NOTE — Progress Notes (Signed)
 eLink Physician-Brief Progress Note Patient Name: Jeffery [name] DOB: [birth year] MRN: [id number]   Date of Service  10/11/2024  HPI/Events of Note  Phos 1.9  eICU Interventions  Sodium phosphate 30 mmol iv x 1.        Milind Raether U Malyn Aytes 10/11/2024, 4:48 AM

## 2024-10-11 NOTE — Progress Notes (Signed)
 eLink Physician-Brief Progress Note Patient Name: Jeffery [name] DOB: 1951/03/17 MRN: [id number]   Date of Service  10/11/2024  HPI/Events of Note  Patient with sub-acute delirium and at risk of falling out  of bed.  eICU Interventions  PRN Zyprexa ordered. Posey restraints ordered.        Iven Earnhart U Deandre Stansel 10/11/2024, 11:20 PM

## 2024-10-11 NOTE — Progress Notes (Signed)
 Daily Progress Note   Patient Name: Jeffery [name]       Date: 10/11/2024 DOB: 1951-05-19  Age: 73 y.o. MRN#: [id number] Attending Physician: Kassie Acquanetta Bradley, MD Primary Care Physician: Sheryle Carwin, MD Admit Date: 10/05/2024  Reason for Consultation/Follow-up: Establishing goals of care  Subjective: I have reviewed medical records including EPIC notes, MAR, available advanced directives in ACP: none, and labs. Noted patient failed SBT trial today. Fentanyl infusion has been stopped. Received report from primary RN - no acute concerns. Per RN, confirmed failed SBT.   Went to visit patient at bedside - no family/visitors present. Patient was lying in bed with eyes open, though he is nonresponsive to my presence. No signs or non-verbal gestures of pain or discomfort noted. No respiratory distress, increased work of breathing, or secretions noted. He is intubated with mitts in place.  12:48 PM Attempted to call wife/Joyce, on mobile and home phone, for ongoing support and to discuss plan today for one way extubation  - no answer - confidential voicemail left and PMT phone number provided with request to return call.  Discussed case with Dr. Kassie - they have been unable to reach family.  2:20 PM Called daughter/Nishika - emotional support provided. She indicates her mother/patient's wife/Joyce is at work today. Provided updates per my and RN assessments as noted above. She confirms plan for one way extubation - if stable/improvement will continue treating the treatable working toward discharge to STR; if decline, transition to full comfort. She will be at the hospital within the hour and will speak with Dr. Kassie.  Provided updates to Dr. Kassie that daughter will be at the hospital within the  hour - she will be available to meet in person or via phone.   Length of Stay: 6  Current Medications: Scheduled Meds:   aspirin   81 mg Per Tube Daily   atorvastatin   10 mg Per Tube Daily   Chlorhexidine Gluconate Cloth  6 each Topical Q0600   docusate  100 mg Per Tube BID   enoxaparin  (LOVENOX ) injection  40 mg Subcutaneous Q24H   feeding supplement (PROSource TF20)  60 mL Per Tube BID   free water  150 mL Per Tube Q4H   insulin  aspart  0-15 Units Subcutaneous Q4H   insulin  aspart  5 Units Subcutaneous Q4H  insulin  glargine-yfgn  18 Units Subcutaneous Daily   multivitamin with minerals  1 tablet Per Tube Daily   mouth rinse  15 mL Mouth Rinse Q2H   pantoprazole  (PROTONIX ) IV  40 mg Intravenous QHS   polyethylene glycol  17 g Per Tube Daily   thiamine   100 mg Per Tube Daily    Continuous Infusions:   ceFAZolin (ANCEF) IV Stopped (10/11/24 0803)   feeding supplement (OSMOLITE 1.5 CAL) Stopped (10/10/24 1945)   sodium PHOSPHATE IVPB (in mmol) 43 mL/hr at 10/11/24 1100   valproate sodium 55 mL/hr at 10/11/24 1100    PRN Meds: acetaminophen  **OR** acetaminophen , fentaNYL (SUBLIMAZE) injection, fentaNYL (SUBLIMAZE) injection, OLANZapine, ondansetron  **OR** ondansetron  (ZOFRAN ) IV, mouth rinse  Physical Exam Vitals and nursing note reviewed.  Constitutional:      General: He is not in acute distress.    Appearance: He is ill-appearing.     Interventions: He is intubated.  Pulmonary:     Effort: No respiratory distress. He is intubated.  Skin:    General: Skin is warm and dry.             Vital Signs: BP (!) 144/78   Pulse (!) 50   Temp (!) 97.5 F (36.4 C) (Oral)   Resp 11   Ht 6' 2 (1.88 m)   Wt 101.7 kg   SpO2 100%   BMI 28.79 kg/m  SpO2: SpO2: 100 % O2 Device: O2 Device: Ventilator O2 Flow Rate:    Intake/output summary:  Intake/Output Summary (Last 24 hours) at 10/11/2024 1215 Last data filed at 10/11/2024 1100 Gross per 24 hour  Intake 1547.13 ml   Output 1025 ml  Net 522.13 ml   LBM: Last BM Date : 10/10/24 Baseline Weight: Weight: 100.7 kg Most recent weight: Weight: 101.7 kg       Palliative Assessment/Data: PPS 10%      Patient Active Problem List   Diagnosis Date Noted   Hypotension 10/05/2024   Type II diabetes mellitus, uncontrolled 02/13/2020   Dementia associated with alcoholism (HCC) 02/13/2020   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   Insomnia disorder related to known organic factor 02/13/2020   Alzheimer's dementia with behavioral disturbance (HCC)    Essential hypertension    Hyperlipidemia    Type 2 diabetes mellitus with stage 3a chronic kidney disease, with long-term current use of insulin  (HCC)    Alcohol abuse    Seizures (HCC) 07/05/2019   Anemia due to blood loss, chronic 05/27/2018   Recurrent hematuria 05/27/2018   History of prostate cancer 05/27/2018   Acute urinary retention 05/10/2018   Memory disorder 12/13/2017   AKI (acute kidney injury) 02/16/2017   Hyponatremia 02/16/2017   Hypokalemia 02/15/2017   ED (erectile dysfunction) of organic origin 11/11/2016   Malignant neoplasm of prostate (HCC) 11/01/2014   Syncope and collapse 12/07/2012   Hyperthyroidism 12/07/2012   Other malaise and fatigue 12/07/2012   Disturbance of skin sensation 12/07/2012   Cervical spondylosis without myelopathy 12/07/2012   Abnormality of gait 12/07/2012   Cerebellar ataxia in diseases classified elsewhere [facility]) 12/07/2012    Palliative Care Assessment & Plan   Patient Profile: 73 year old man w/ hx of alcoholic dementia, DM2, HTN, HLD p/w sudden onset AMS. Workup revealed septic shock, pyuria, bilateral pyelo and presumed septic encephalopathy. CT head neg. Somnolent with high secretion burden so PCCM consulted - intubated, has not passed SBT's.    Palliative care has been asked to support GOC conversations.  Assessment: Principal Problem:    Hypotension   Recommendations/Plan: Plan for CCM to do one way extubation once family has arrived Now DNR-Limited If stable/improvement, continue treating the treatable. If he declines, transition to full comfort Ongoing GOC pending clinical course PMT will continue to follow and support holistically  Goals of Care and Additional Recommendations: Limitations on Scope of Treatment: No Tracheostomy  Code Status:    Code Status Orders  (From admission, onward)           Start     Ordered   10/08/24 1150  Do not attempt resuscitation (DNR) Pre-Arrest Interventions Desired  (Code Status)  Continuous       Question Answer Comment  If pulseless and not breathing No CPR or chest compressions.   In Pre-Arrest Conditions (Patient Has Pulse and Is Breathing) May intubate, use advanced airway interventions and cardioversion/ACLS medications if appropriate or indicated. May transfer to ICU.   Consent: Discussion documented in EHR or advanced directives reviewed      10/08/24 1149           Code Status History     Date Active Date Inactive Code Status Order ID Comments User Context   10/06/2024 0255 10/08/2024 1149 Full Code [id number]  Manfred Driver, DO Inpatient   02/06/2021 0312 02/06/2021 2233 Full Code [id number]  Raford Lenis, MD ED   07/05/2019 2326 07/08/2019 1947 Full Code [id number]  Pearlean Tully BRAVO, MD ED   02/16/2017 0140 02/17/2017 2101 Full Code [id number]  Franchot Carrier, MD Inpatient       Prognosis:  Unable to determine  Discharge Planning: To Be Determined  Care plan was discussed with Dr. Kassie, primary RN, patient's daughter  Thank you for allowing the Palliative Medicine Team to assist in the care of this patient.   Total Time 40 minutes Prolonged Time Billed  no       Jeoffrey CHRISTELLA Sharps, NP  Please contact Palliative Medicine Team phone at [phone] for questions and concerns.   *Portions of this note are a verbal dictation therefore any spelling  and/or grammatical errors are due to the Dragon Medical One system interpretation.

## 2024-10-11 NOTE — Progress Notes (Signed)
 NAME:  Jeffery [name], MRN:  [id number], DOB:  1951/03/21, LOS: 6 ADMISSION DATE:  10/05/2024, CONSULTATION DATE:  10/08/24 REFERRING MD:  Bryn, MD CHIEF COMPLAINT:  Shock and AMS   History of Present Illness:  73 year old male with history of alcohol abuse, dementia, diabetes type 2, hypertension hyperlipidemia who presented with sudden altered mental status.  Workup revealed septic shock, pyuria, bilateral pyelonephritis presumed septic encephalopathy.  CT head negative.  Unfortunately very somnolent with high secretion burden so PCCM consulted via telemetry consult on 10/06/2024.  Due to need for EEG and MRI patient was transferred to [name] and arrived overnight on 10/07/2024.  MRI completed.  With no signs of seizure activity EEG was held.  Pertinent  Medical History  alcohol abuse, dementia, diabetes type 2, hypertension hyperlipidemia  Significant Hospital Events: Including procedures, antibiotic start and stop dates in addition to other pertinent events   11/8 PCCM consulted at [facility] via televideo 11/9 Transferred to [facility] for MRI +/- EEG 11/10 Failed SBT, remains encephalopathic 11/11 Passed SBT 11/12 Passed SBT. Palliative confirmed one way extubation  Interim History / Subjective:  Plan for one way extubation when family arrives Overnight, OGT was inadvertently replaced into right lung. No meds had been given after replacement so OGT discontinued.   Objective    Blood pressure 137/61, pulse (!) 51, temperature 97.7 F (36.5 C), temperature source Axillary, resp. rate 12, height 6' 2 (1.88 m), weight 101.7 kg, SpO2 100%.    Vent Mode: PRVC FiO2 (%):  [30 %] 30 % Set Rate:  [12 bmp] 12 bmp Vt Set:  [660 mL] 660 mL PEEP:  [5 cmH20] 5 cmH20 Pressure Support:  [10 cmH20] 10 cmH20 Plateau Pressure:  [16 cmH20-17 cmH20] 16 cmH20   Intake/Output Summary (Last 24 hours) at 10/11/2024 0755 Last data filed at 10/11/2024 0600 Gross per 24 hour  Intake 1729.99 ml  Output 1525 ml   Net 204.99 ml   Filed Weights   10/05/24 1938 10/10/24 0900  Weight: 100.7 kg 101.7 kg   Physical Exam: General: Chronically ill-appearing, no acute distress HENT: [place], AT, ETT in place Eyes: EOMI, no scleral icterus Respiratory: Clear to auscultation bilaterally.  No crackles, wheezing or rales Cardiovascular: RRR, -M/R/G, no JVD GI: BS+, soft, nontender Extremities:-Edema,-tenderness Neuro: Drowsy, opens eyes, does not follow commands, moves extremities x 4 GU: External foley in place  Imaging, labs and test noted above have been reviewed independently by me. BUN/Cr improving 23/1.32 CO2 27 WBC 10.1  MRI 10/07/24 - No acute intracranial abnormality, small 3 mm meningioma without mass effect CXR 10/07/24 Small left pleural effusion and atelectasis Echo 10/06/24  EF 55-60%, grade I DD CT AP 10/06/24 Acute pyelonephritis, bilateral subpleural consolidation, chronic pancreatitis CT head 10/05/24 NAICA  Resolved problem list   Assessment and Plan   Acute metabolic encephalopathy Dementia Hx alcohol abuse CT head 11/7 and MRI 11/9 unrevealing. No evidence of seizure. VPA level therapeutic and ammonia wnl. Unclear etiology. Electrolyte abnormalities would not explain severity of current presentation. Suspect his he may be at baseline with his dementia P: Continue Depakote  Appreciate Neurology input. EEG neg PRN Zyprexa for agitation Holding aricept  due to bradycardia  Acute hypoxemic respiratory failure 2/2 above P: Full vent support LTVV, 4-8cc/kg IBW with goal Pplat<30 and DP<15 VAP PAD protocol Plan for one way extubation  Septic shock secondary to Proteus UTI, pyelonephritis -resolved shock and lactic acidosis Hx ESBL UTI Admission cx + Proteus P: Complete Ancef for 7 days total (  end 11/13) Diurese  Acute kidney failure - improving CFB+ P:  Monitor UOP/Cr  DM2 Hyperglycemia P: Semglee 18 U nightly Schedule novolog  5U every 4  hours SSI  Hypophosphatemia Replete  GOC DNR Palliative following. Will readdress GOC with wife on Wednesday (tomorrow)   Critical care time: 38 min   The patient is critically ill with multiple organ systems failure and requires high complexity decision making for assessment and support, frequent evaluation and titration of therapies, application of advanced monitoring technologies and extensive interpretation of multiple databases.  Independent Critical Care Time: 38 Minutes.   Slater Staff, M.D. [facility] Pulmonary/Critical Care Medicine 10/11/2024 7:56 AM   Please see Amion for pager number to reach on-call Pulmonary and Critical Care Team.

## 2024-10-12 ENCOUNTER — Inpatient Hospital Stay (HOSPITAL_COMMUNITY)

## 2024-10-12 DIAGNOSIS — A419 Sepsis, unspecified organism: Secondary | ICD-10-CM | POA: Diagnosis not present

## 2024-10-12 DIAGNOSIS — J9601 Acute respiratory failure with hypoxia: Secondary | ICD-10-CM | POA: Diagnosis not present

## 2024-10-12 DIAGNOSIS — N12 Tubulo-interstitial nephritis, not specified as acute or chronic: Secondary | ICD-10-CM | POA: Diagnosis not present

## 2024-10-12 DIAGNOSIS — G9341 Metabolic encephalopathy: Secondary | ICD-10-CM | POA: Diagnosis not present

## 2024-10-12 LAB — BASIC METABOLIC PANEL WITH GFR
Anion gap: 12 (ref 5–15)
BUN: 20 mg/dL (ref 8–23)
CO2: 28 mmol/L (ref 22–32)
Calcium: 8.6 mg/dL — ABNORMAL LOW (ref 8.9–10.3)
Chloride: 102 mmol/L (ref 98–111)
Creatinine, Ser: 1.26 mg/dL — ABNORMAL HIGH (ref 0.61–1.24)
GFR, Estimated: 60 mL/min — ABNORMAL LOW (ref >60)
Glucose, Bld: 102 mg/dL — ABNORMAL HIGH (ref 70–99)
Potassium: 3.8 mmol/L (ref 3.5–5.1)
Sodium: 142 mmol/L (ref 135–145)

## 2024-10-12 LAB — GLUCOSE, CAPILLARY
Glucose-Capillary: 143 mg/dL — ABNORMAL HIGH (ref 70–99)
Glucose-Capillary: 196 mg/dL — ABNORMAL HIGH (ref 70–99)
Glucose-Capillary: 216 mg/dL — ABNORMAL HIGH (ref 70–99)
Glucose-Capillary: 228 mg/dL — ABNORMAL HIGH (ref 70–99)
Glucose-Capillary: 74 mg/dL (ref 70–99)
Glucose-Capillary: 90 mg/dL (ref 70–99)

## 2024-10-12 LAB — MAGNESIUM: Magnesium: 2.1 mg/dL (ref 1.7–2.4)

## 2024-10-12 LAB — PHOSPHORUS: Phosphorus: 3.1 mg/dL (ref 2.5–4.6)

## 2024-10-12 MED ORDER — OSMOLITE 1.5 CAL PO LIQD
1000.0000 mL | ORAL | Status: DC
  Administered 2024-10-12 – 2024-10-14 (×4): 1000 mL
  Filled 2024-10-12 (×6): qty 1000

## 2024-10-12 MED ORDER — FUROSEMIDE 10 MG/ML IJ SOLN
40.0000 mg | Freq: Once | INTRAMUSCULAR | Status: AC
  Administered 2024-10-12: 40 mg via INTRAVENOUS
  Filled 2024-10-12: qty 4

## 2024-10-12 MED ORDER — OLANZAPINE 10 MG IM SOLR
2.5000 mg | Freq: Once | INTRAMUSCULAR | Status: DC | PRN
  Filled 2024-10-12 (×3): qty 10

## 2024-10-12 MED ORDER — VALPROIC ACID 250 MG/5ML PO SOLN: 500.0000 mg | Freq: Two times a day (BID) | ORAL | Status: DC

## 2024-10-12 MED ORDER — STERILE WATER FOR INJECTION IJ SOLN
INTRAMUSCULAR | Status: AC
  Filled 2024-10-12: qty 10

## 2024-10-12 MED ORDER — VALPROIC ACID 250 MG/5ML PO SOLN
500.0000 mg | Freq: Two times a day (BID) | ORAL | Status: DC
  Administered 2024-10-12 – 2024-10-17 (×10): 500 mg via ORAL
  Filled 2024-10-12 (×13): qty 10

## 2024-10-12 MED ORDER — ADULT MULTIVITAMIN W/MINERALS CH
1.0000 | ORAL_TABLET | Freq: Every day | ORAL | Status: DC
  Administered 2024-10-13 – 2024-10-17 (×5): 1 via ORAL
  Filled 2024-10-12 (×5): qty 1

## 2024-10-12 MED ORDER — ATORVASTATIN CALCIUM 10 MG PO TABS
10.0000 mg | ORAL_TABLET | Freq: Every day | ORAL | Status: DC
  Administered 2024-10-13 – 2024-10-17 (×5): 10 mg via ORAL
  Filled 2024-10-12 (×5): qty 1

## 2024-10-12 MED ORDER — PROSOURCE TF20 ENFIT COMPATIBL EN LIQD
60.0000 mL | Freq: Every day | ENTERAL | Status: DC
  Administered 2024-10-12 – 2024-10-15 (×4): 60 mL
  Filled 2024-10-12 (×4): qty 60

## 2024-10-12 MED ORDER — THIAMINE MONONITRATE 100 MG PO TABS
100.0000 mg | ORAL_TABLET | Freq: Every day | ORAL | Status: DC
  Administered 2024-10-13 – 2024-10-17 (×5): 100 mg via ORAL
  Filled 2024-10-12 (×5): qty 1

## 2024-10-12 MED ORDER — DOCUSATE SODIUM 50 MG/5ML PO LIQD
100.0000 mg | Freq: Two times a day (BID) | ORAL | Status: DC
  Administered 2024-10-12 – 2024-10-17 (×10): 100 mg via ORAL
  Filled 2024-10-12 (×10): qty 10

## 2024-10-12 MED ORDER — ASPIRIN 81 MG PO CHEW
81.0000 mg | CHEWABLE_TABLET | Freq: Every day | ORAL | Status: DC
  Administered 2024-10-13 – 2024-10-17 (×5): 81 mg via ORAL
  Filled 2024-10-12 (×5): qty 1

## 2024-10-12 MED ORDER — INSULIN GLARGINE-YFGN 100 UNIT/ML ~~LOC~~ SOLN
18.0000 [IU] | Freq: Every day | SUBCUTANEOUS | Status: DC
  Administered 2024-10-12 – 2024-10-16 (×5): 18 [IU] via SUBCUTANEOUS
  Filled 2024-10-12 (×7): qty 0.18

## 2024-10-12 MED ORDER — POLYETHYLENE GLYCOL 3350 17 G PO PACK
17.0000 g | PACK | Freq: Every day | ORAL | Status: DC
  Administered 2024-10-14 – 2024-10-17 (×4): 17 g via ORAL
  Filled 2024-10-12 (×4): qty 1

## 2024-10-12 NOTE — Plan of Care (Signed)
 Patient progressing within all aspects of care, increasing alert, on RA.

## 2024-10-12 NOTE — TOC Progression Note (Addendum)
 Transition of Care [facility]) - Progression Note    Patient Details  Name: FILIP LUTEN MRN: [id number] Date of Birth: 1951-06-21  Transition of Care [facility]) CM/SW Contact  Lauraine FORBES Saa, LCSWA Phone Number: 10/12/2024, 3:07 PM  Clinical Narrative:     3:07 PM CSW introduced self and role to patient's spouse, Merlynn (per chart review, patient is not fully oriented). CSW informed Merlynn of therapy's recommendation of patient discharging to SNF. Merlynn was agreeable with SNF and expressed interest in LTC. CSW contacted financial counseling for assistance with Medicaid LTC. Merlynn consented CSW to send patient's referral to SNFs in [place] and [place] counties. CSW sent FL2 to SNFs in [place] and [place] counties. CSW will continue to follow.  3:48 PM Financial counseling informed CSW that patient is over income for Medicaid eligibility.  Expected Discharge Plan: Skilled Nursing Facility Barriers to Discharge: English As A Second Language Teacher, Continued Medical Work up, SNF Pending bed offer               Expected Discharge Plan and Services In-house Referral: Clinical Social Work   Post Acute Care Choice: Skilled Nursing Facility Living arrangements for the past 2 months: Assisted Living Facility (Adult Care Home)                                       Social Drivers of Health (SDOH) Interventions SDOH Screenings   Food Insecurity: Patient Unable To Answer (10/06/2024)  Housing: Patient Unable To Answer (10/06/2024)  Transportation Needs: Patient Unable To Answer (10/06/2024)  Utilities: Patient Unable To Answer (10/06/2024)  Depression (PHQ2-9): Low Risk  (02/06/2021)  Social Connections: Patient Unable To Answer (10/06/2024)  Tobacco Use: Medium Risk (10/05/2024)    Readmission Risk Interventions    10/07/2024   10:01 AM 10/06/2024    8:24 AM  Readmission Risk Prevention Plan  Transportation Screening Complete Complete  HRI or Home Care Consult Complete Complete   Social Work Consult for Recovery Care Planning/Counseling Complete Complete  Palliative Care Screening Not Applicable Not Applicable  Medication Review [employer]) Complete Complete

## 2024-10-12 NOTE — Progress Notes (Signed)
 Nutrition Follow-up  DOCUMENTATION CODES:  Not applicable  INTERVENTION:  Initiate tube feeding via cortrak: Osmolite 1.5 at 55 ml/h (1320 ml per day) Start at 35 and advance by 10mL every 4 hours to reach goal Prosource TF20 60 ml 1x/d Provides 2060 kcal, 103 gm protein, 1006 ml free water daily Thiamine  and MVI with minerals daily Regular diet per SLP Automatic trays Feeding assistance  NUTRITION DIAGNOSIS:  Inadequate oral intake related to acute illness as evidenced by NPO status. - remains applicable   GOAL:  Patient will meet greater than or equal to 90% of their needs - progressing  MONITOR:  TF tolerance, Diet advancement, I & O's, Vent status, Labs, Weight trends  REASON FOR ASSESSMENT:  Consult Enteral/tube feeding initiation and management  ASSESSMENT:  73 year old man w/ hx of alcoholic dementia, DM2, HTN, HLD, CKD 3b, from Group home who presented with sudden onset AMS and unresponsiveness. Workup revealed septic shock, hypoglycemia, hypotensions, pyuria, bilateral pyelo and presumed septic encephalopathy.  11/8 - Admitted to ICU, intubated  11/13 - pt one-way extubated to nasal cannula 11/14 - cortrak placed gastric  Pt resting in bed at the time of assessment. Has on mittens and posey belt. Restless, but awake and alert. Doesn't respond to many nutrition questions but does say he likes to keep busy. SLP able to advance to a regular diet this AM but pt will need feeding assistance.   Pt discussed during ICU rounds and with RN and MD. Leim placed this AM, will leave for a few days to ensure medications can be administered. MD ok with restarting feeds as well.   Admit weight: 100.7 kg  Current weight: 101.7 kg    Intake/Output Summary (Last 24 hours) at 10/12/2024 1253 Last data filed at 10/12/2024 0600 Gross per 24 hour  Intake 255.1 ml  Output 1400 ml  Net -1144.9 ml  Net IO Since Admission: 7,348.39 mL [10/12/24 1253]  Drains/Lines: UOP  x 24 hours Cortrak (gastric)  Nutritionally Relevant Medications: Scheduled Meds:  atorvastatin   10 mg Per Tube Daily   docusate  100 mg Per Tube BID   furosemide  40 mg Intravenous Once   insulin  aspart  0-15 Units Subcutaneous Q4H   insulin  glargine-yfgn  18 Units Subcutaneous QHS   multivitamin with minerals  1 tablet Per Tube Daily   pantoprazole  IV  40 mg Intravenous QHS   polyethylene glycol  17 g Per Tube Daily   thiamine   100 mg Per Tube Daily   PRN Meds: ondansetron   Labs Reviewed: Creatinine 1.26 CBG ranges from 52-246 mg/dL over the last 24 hours HgbA1c 9.6%  NUTRITION - FOCUSED PHYSICAL EXAM: Flowsheet Row Most Recent Value  Orbital Region No depletion  Upper Arm Region No depletion  Thoracic and Lumbar Region No depletion  Buccal Region No depletion  Temple Region No depletion  Clavicle Bone Region No depletion  Clavicle and Acromion Bone Region Mild depletion  Scapular Bone Region No depletion  Dorsal Hand Unable to assess  [restraints/mittens]  Patellar Region No depletion  Anterior Thigh Region No depletion  Posterior Calf Region No depletion  Edema (RD Assessment) Mild  [generalized]  Hair Reviewed  Eyes Reviewed  Mouth Reviewed  Skin Reviewed  Nails Unable to assess  [mittens]    Diet Order:   Diet Order             Diet NPO time specified  Diet effective now  EDUCATION NEEDS:  Not appropriate for education at this time  Skin:  Skin Assessment: Reviewed RN Assessment Stage 2: Right arm, (1 x 1.5 cm)  Last BM:  11/12 - type 6  Height:  Ht Readings from Last 1 Encounters:  10/06/24 6' 2 (1.88 m)    Weight:  Wt Readings from Last 1 Encounters:  10/10/24 101.7 kg    Ideal Body Weight:  86.4 kg  BMI:  Body mass index is 28.79 kg/m.  Estimated Nutritional Needs:  Kcal:  2000-2200 kcal/d Protein:  95-110g/d Fluid:  >2L/day    Vernell Lukes, RD, LDN, CNSC Registered Dietitian II Please reach  out via secure chat

## 2024-10-12 NOTE — Progress Notes (Signed)
 Daily Progress Note   Patient Name: Jeffery [name]       Date: 10/12/2024 DOB: [birth year]  Age: 73 y.o. MRN#: [id number] Attending Physician: Kassie Acquanetta Bradley, MD Primary Care Physician: Sheryle Carwin, MD Admit Date: 10/05/2024  Reason for Consultation/Follow-up: Establishing goals of care  Subjective: I have reviewed medical records including EPIC notes, MAR, and labs.  Patient was extubated yesterday 11/13.  Received report from primary RN -no acute concerns.  Per RN, patient is restless, not following directions, attempting to get out of bed, hard to redirect.  Went to visit patient at bedside - no family/visitors present.  Patient is lying in bed awake, alert, oriented to self only - his speech is mumbled and his interaction is very minimal. No respiratory distress or increased work of breathing; weak, congested cough noted.  Coretrak and mitts in use.   11:19 AM Attempted to call wife/Joyce on mobile and home number to provide ongoing support and clinical assessment updates - no answer - confidential voicemail left and PMT phone number provided with request to return call.  11:23 AM Attempted to call daughter/Nishika x2 - phone was unable to connect.   Length of Stay: 7  Current Medications: Scheduled Meds:   aspirin   81 mg Per Tube Daily   atorvastatin   10 mg Per Tube Daily   Chlorhexidine Gluconate Cloth  6 each Topical Q0600   [COMPLETED] dextrose        docusate  100 mg Per Tube BID   enoxaparin  (LOVENOX ) injection  40 mg Subcutaneous Q24H   insulin  aspart  0-15 Units Subcutaneous Q4H   insulin  glargine-yfgn  18 Units Subcutaneous QHS   multivitamin with minerals  1 tablet Per Tube Daily   mouth rinse  15 mL Mouth Rinse Q2H   pantoprazole  (PROTONIX ) IV  40 mg Intravenous QHS    polyethylene glycol  17 g Per Tube Daily   sterile water (preservative free)       thiamine   100 mg Per Tube Daily    Continuous Infusions:  valproate sodium 500 mg (10/12/24 1107)    PRN Meds: acetaminophen  **OR** acetaminophen , [COMPLETED] dextrose , OLANZapine, ondansetron  **OR** ondansetron  (ZOFRAN ) IV, mouth rinse, sterile water (preservative free)  Physical Exam Vitals and nursing note reviewed.  Constitutional:      General: He is not in acute distress.  Appearance: He is ill-appearing.  Pulmonary:     Effort: No respiratory distress.  Skin:    General: Skin is warm and dry.  Neurological:     Mental Status: He is disoriented.     Motor: Weakness present.  Psychiatric:        Behavior: Behavior is slowed.        Cognition and Memory: Cognition is impaired. Memory is impaired.        Judgment: Judgment is impulsive.             Vital Signs: BP 109/66   Pulse (!) 46   Temp (!) 97.5 F (36.4 C) (Axillary)   Resp 13   Ht 6' 2 (1.88 m)   Wt 101.7 kg   SpO2 100%   BMI 28.79 kg/m  SpO2: SpO2: 100 % O2 Device: O2 Device: Nasal Cannula O2 Flow Rate: O2 Flow Rate (L/min): 2 L/min  Intake/output summary:  Intake/Output Summary (Last 24 hours) at 10/12/2024 1119 Last data filed at 10/12/2024 0600 Gross per 24 hour  Intake 271.3 ml  Output 1400 ml  Net -1128.7 ml   LBM: Last BM Date : 10/10/24 Baseline Weight: Weight: 100.7 kg Most recent weight: Weight: 101.7 kg       Palliative Assessment/Data: PPS 30% with tube feeds       Patient Active Problem List   Diagnosis Date Noted   Hypotension 10/05/2024   Type II diabetes mellitus, uncontrolled 02/13/2020   Dementia associated with alcoholism (HCC) 02/13/2020   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   Insomnia disorder related to known organic factor 02/13/2020   Alzheimer's dementia with behavioral disturbance (HCC)    Essential hypertension    Hyperlipidemia    Type 2 diabetes mellitus  with stage 3a chronic kidney disease, with long-term current use of insulin  (HCC)    Alcohol abuse    Seizures (HCC) 07/05/2019   Anemia due to blood loss, chronic 05/27/2018   Recurrent hematuria 05/27/2018   History of prostate cancer 05/27/2018   Acute urinary retention 05/10/2018   Memory disorder 12/13/2017   AKI (acute kidney injury) 02/16/2017   Hyponatremia 02/16/2017   Hypokalemia 02/15/2017   ED (erectile dysfunction) of organic origin 11/11/2016   Malignant neoplasm of prostate (HCC) 11/01/2014   Syncope and collapse 12/07/2012   Hyperthyroidism 12/07/2012   Other malaise and fatigue 12/07/2012   Disturbance of skin sensation 12/07/2012   Cervical spondylosis without myelopathy 12/07/2012   Abnormality of gait 12/07/2012   Cerebellar ataxia in diseases classified elsewhere [facility]) 12/07/2012    Palliative Care Assessment & Plan   Patient Profile: 73 year old man w/ hx of alcoholic dementia, DM2, HTN, HLD p/w sudden onset AMS. Workup revealed septic shock, pyuria, bilateral pyelo and presumed septic encephalopathy. CT head neg. Somnolent with high secretion burden so PCCM consulted - intubated, has not passed SBT's.    Palliative care has been asked to support GOC conversations.   Assessment: Principal Problem:   Hypotension   Recommendations/Plan: Continue current plan of care with watchful waiting Continue DNR-Limited as previously documented If stable/improvement, continue treating the treatable with goal for discharge to STR and likely subsequent LTC placement. If he declines, transition to full comfort Ongoing GOC pending clinical course. Unable to speak with family today PMT will plan to follow up Sunday 11/16. If there are any imminent needs please call the service directly  Goals of Care and Additional Recommendations: Limitations on Scope of Treatment: Full Scope Treatment  Code  Status:    Code Status Orders  (From admission, onward)            Start     Ordered   10/11/24 1432  Do not attempt resuscitation (DNR)- Limited -Do Not Intubate (DNI)  (Code Status)  Continuous       Question Answer Comment  If pulseless and not breathing No CPR or chest compressions.   In Pre-Arrest Conditions (Patient Is Breathing and Has A Pulse) Do not intubate. Provide all appropriate non-invasive medical interventions. Avoid ICU transfer unless indicated or required.   Consent: Discussion documented in EHR or advanced directives reviewed      10/11/24 1431           Code Status History     Date Active Date Inactive Code Status Order ID Comments User Context   10/08/2024 1149 10/11/2024 1431 Do not attempt resuscitation (DNR) PRE-ARREST INTERVENTIONS DESIRED [id number]  Esequiel Rosaline GRADE, NP Inpatient   10/06/2024 0255 10/08/2024 1149 Full Code [id number]  Manfred Driver, DO Inpatient   02/06/2021 0312 02/06/2021 2233 Full Code [id number]  Raford Lenis, MD ED   07/05/2019 2326 07/08/2019 1947 Full Code [id number]  Pearlean Tully BRAVO, MD ED   02/16/2017 0140 02/17/2017 2101 Full Code [id number]  Franchot Carrier, MD Inpatient       Prognosis:  Unable to determine  Discharge Planning: To Be Determined  Care plan was discussed with primary RN  Thank you for allowing the Palliative Medicine Team to assist in the care of this patient.   Total Time 35 minutes Prolonged Time Billed  no       Jeoffrey CHRISTELLA Sharps, NP  Please contact Palliative Medicine Team phone at [phone] for questions and concerns.   *Portions of this note are a verbal dictation therefore any spelling and/or grammatical errors are due to the Dragon Medical One system interpretation.

## 2024-10-12 NOTE — Progress Notes (Addendum)
 Patient in room with wife and daughter at bedside. Oriented family to unit. Pt in bed with bilateral mittens, waist posey belt and tube feeding on pump. Patient with primofit in place. Vitals taken and documented. Pt will need two person assessment. Report given to PM RN from charge nurse report. Ann Nena Hoard, RN

## 2024-10-12 NOTE — Evaluation (Signed)
 Physical Therapy Evaluation Patient Details Name: Jeffery [name] MRN: [id number] DOB: Sep 27, 1951 Today's Date: 10/12/2024  History of Present Illness  73 year old who presented [facility] on 11/9 with sudden altered mental status.  Workup revealed septic shock, pyuria, bilateral pyelonephritis presumed septic encephalopathy.  CT head negative.  Initially  very somnolent with high secretion burden, intubated on 10/06/2024 from 11/12 when Palliative and family decided on one-way extubation.  Pt with a history of alcohol abuse, dementia, diabetes type 2, hypertension hyperlipidemia  Clinical Impression  Currently pt is presenting at Total A for bed mobility and intermittent Min A for sitting balance on EOB. Pt demonstrates poor motor planning and initiation in order to participate in functional mobility at this time. Previously pt required little to no assistance and spouse states he does not have AD. Due to pt current functional status, home set up and available assistance at home recommending skilled physical therapy services < 3 hours/day in order to address strength, balance and functional mobility to decrease risk for falls, injury, immobility, skin break down and re-hospitalization.        If plan is discharge home, recommend the following: A little help with walking and/or transfers;Help with stairs or ramp for entrance;Assist for transportation;Assistance with cooking/housework;Supervision due to cognitive status   Can travel by private vehicle   No    Equipment Recommendations Wheelchair cushion (measurements PT);Wheelchair (measurements PT);Hoyer lift;Hospital bed     Functional Status Assessment Patient has had a recent decline in their functional status and demonstrates the ability to make significant improvements in function in a reasonable and predictable amount of time.     Precautions / Restrictions Precautions Precautions: Fall Recall of Precautions/Restrictions:  Impaired Precaution/Restrictions Comments: cortrak Restrictions Weight Bearing Restrictions Per Provider Order: No      Mobility  Bed Mobility Overal bed mobility: Needs Assistance Bed Mobility: Supine to Sit, Sit to Supine     Supine to sit: Total assist Sit to supine: Total assist   General bed mobility comments: Total A with poor initiation for sitting <> supine despite heavy mutli modal cues    Transfers     General transfer comment: unable. Attempted with heavy cues but pt not inititating any LE muscle activation to assist.    Ambulation/Gait     General Gait Details: unable at this time.    Balance Overall balance assessment: Needs assistance Sitting-balance support: No upper extremity supported, Feet supported Sitting balance-Leahy Scale: Poor Sitting balance - Comments: intermittent Min A sitting EOB pt able to sit ~ 10 min at close SBA       Pertinent Vitals/Pain Pain Assessment Pain Assessment: Faces Faces Pain Scale: No hurt Breathing: normal Negative Vocalization: none Facial Expression: smiling or inexpressive Body Language: relaxed Consolability: no need to console PAINAD Score: 0 Pain Intervention(s): Monitored during session    Home Living Family/patient expects to be discharged to:: Group home Living Arrangements: Spouse/significant other Available Help at Discharge: Family;Available PRN/intermittently Type of Home: House Home Access: Stairs to enter Entrance Stairs-Rails: None Entrance Stairs-Number of Steps: 1-2   Home Layout: One level Home Equipment: Agricultural Consultant (2 wheels) Additional Comments: Called spouse for home set up. Spouse states pt was living in a home wtih 6 other people she is unsure what it is called.    Prior Function Prior Level of Function : Independent/Modified Independent         Extremity/Trunk Assessment   Upper Extremity Assessment Upper Extremity Assessment: Defer to OT evaluation  Lower Extremity  Assessment Lower Extremity Assessment: Difficult to assess due to impaired cognition    Cervical / Trunk Assessment Cervical / Trunk Assessment: Normal  Communication   Communication Communication: No apparent difficulties    Cognition Arousal: Alert Behavior During Therapy: Anxious, Flat affect, Restless   PT - Cognitive impairments: History of cognitive impairments, Problem solving, Safety/Judgement, Awareness, Memory, Orientation, Attention, Initiation, Sequencing   Orientation impairments: Place, Time, Situation     Following commands: Impaired Following commands impaired: Follows one step commands inconsistently     Cueing Cueing Techniques: Verbal cues, Gestural cues, Tactile cues, Visual cues     General Comments General comments (skin integrity, edema, etc.): vital signs WNL on room air had 2L O2 running but pt had removed it        Assessment/Plan    PT Assessment Patient needs continued PT services  PT Problem List Decreased mobility;Decreased balance;Decreased safety awareness;Decreased cognition       PT Treatment Interventions DME instruction;Balance training;Gait training;Neuromuscular re-education;Stair training;Functional mobility training;Therapeutic activities;Therapeutic exercise;Patient/family education;Wheelchair mobility training    PT Goals (Current goals can be found in the Care Plan section)  Acute Rehab PT Goals PT Goal Formulation: Patient unable to participate in goal setting Time For Goal Achievement: 10/12/24 Potential to Achieve Goals: Fair    Frequency Min 2X/week        AM-PAC PT 6 Clicks Mobility  Outcome Measure Help needed turning from your back to your side while in a flat bed without using bedrails?: Total Help needed moving from lying on your back to sitting on the side of a flat bed without using bedrails?: Total Help needed moving to and from a bed to a chair (including a wheelchair)?: Total Help needed standing up  from a chair using your arms (e.g., wheelchair or bedside chair)?: Total Help needed to walk in hospital room?: Total Help needed climbing 3-5 steps with a railing? : Total 6 Click Score: 6    End of Session Equipment Utilized During Treatment: Gait belt Activity Tolerance: Other (comment);Patient tolerated treatment well (limited due to cognition) Patient left: in bed;with call bell/phone within reach;with bed alarm set;Other (comment);with restraints reapplied;with nursing/sitter in room (fall pads, restraints) Nurse Communication: Mobility status PT Visit Diagnosis: Other abnormalities of gait and mobility (R26.89)    Time: 1030-1109 PT Time Calculation (min) (ACUTE ONLY): 39 min   Charges:   PT Evaluation $PT Eval Low Complexity: 1 Low PT Treatments $Therapeutic Activity: 23-37 mins PT General Charges $$ ACUTE PT VISIT: 1 Visit         Dorothyann Maier, DPT, CLT  Acute Rehabilitation Services Office: [phone] (Secure chat preferred)   Dorothyann VEAR Maier 10/12/2024, 1:46 PM

## 2024-10-12 NOTE — Procedures (Signed)
 Cortrak  Person Inserting Tube:  Mady Dolly, RD Tube Type:  Cortrak - 43 inches Tube Size:  10 Tube Location:  Right nare Secured by: Bridle Technique Used to Measure Tube Placement:  Marking at nare/corner of mouth Cortrak Secured At:  77 cm Initial Placement Verification:  Xray  Cortrak Tube Team Note:  Consult received to place a Cortrak feeding tube.   X-ray is required. RN may begin using tube after appropriate positioning confirmed.   If the tube becomes dislodged please keep the tube and contact the Cortrak team at [email or web address] for replacement.  If after hours and replacement cannot be delayed, place a NG tube and confirm placement with an abdominal x-ray.   Dolly Mady MS, RD, LDN Registered Dietitian Clinical Nutrition RD Inpatient Contact Info in Amion

## 2024-10-12 NOTE — Progress Notes (Signed)
 eLink Physician-Brief Progress Note Patient Name: Jeffery [name] DOB: 1951-01-30 MRN: [id number]   Date of Service  10/12/2024  HPI/Events of Note  Patient  needs restraints renewed for safety.  eICU Interventions  Restraints renewed.        Apolonio Cutting U Adaira Centola 10/12/2024, 10:59 PM

## 2024-10-12 NOTE — Progress Notes (Signed)
 NAME:  Jeffery [name], MRN:  [id number], DOB:  Jul 08, 1951, LOS: 7 ADMISSION DATE:  10/05/2024, CONSULTATION DATE:  10/08/24 REFERRING MD:  Bryn, MD CHIEF COMPLAINT:  Shock and AMS   History of Present Illness:  73 year old male with history of alcohol abuse, dementia, diabetes type 2, hypertension hyperlipidemia who presented with sudden altered mental status.  Workup revealed septic shock, pyuria, bilateral pyelonephritis presumed septic encephalopathy.  CT head negative.  Unfortunately very somnolent with high secretion burden so PCCM consulted via telemetry consult on 10/06/2024.  Due to need for EEG and MRI patient was transferred to [facility] and arrived overnight on 10/07/2024.  MRI completed.  With no signs of seizure activity EEG was held.  Pertinent  Medical History  alcohol abuse, dementia, diabetes type 2, hypertension hyperlipidemia  Significant Hospital Events: Including procedures, antibiotic start and stop dates in addition to other pertinent events   11/8 PCCM consulted at [facility] via televideo 11/9 Transferred to [facility] for MRI +/- EEG 11/10 Failed SBT, remains encephalopathic 11/11 Passed SBT 11/12 Passed SBT. Palliative confirmed one way extubation  Interim History / Subjective:  Extubated yesterday On [place] Overnight required IV zyprexa for agitation Objective    Blood pressure 107/89, pulse (!) 56, temperature (!) 96.7 F (35.9 C), temperature source Axillary, resp. rate 13, height 6' 2 (1.88 m), weight 101.7 kg, SpO2 100%.    Vent Mode: PSV;CPAP FiO2 (%):  [30 %] 30 % Set Rate:  [12 bmp] 12 bmp Vt Set:  [660 mL] 660 mL PEEP:  [5 cmH20] 5 cmH20 Pressure Support:  [5 cmH20-10 cmH20] 5 cmH20 Plateau Pressure:  [15 cmH20-16 cmH20] 15 cmH20   Intake/Output Summary (Last 24 hours) at 10/12/2024 0718 Last data filed at 10/12/2024 0100 Gross per 24 hour  Intake 675.99 ml  Output 800 ml  Net -124.01 ml   Filed Weights   10/05/24 1938 10/10/24 0900  Weight: 100.7 kg  101.7 kg   Physical Exam: General: Chronically ill-appearing, mildly encephalopathic HENT: [place], AT, OP clear, MMM Eyes: EOMI, no scleral icterus Respiratory: Clear to auscultation bilaterally.  No crackles, wheezing or rales Cardiovascular: RRR, -M/R/G, no JVD GI: BS+, soft, nontender Extremities:-Edema,-tenderness Neuro: Awake, does not follow commands, CNII-XII grossly intact GU: External foley in place  Imaging, labs and test noted above have been reviewed independently by me. No labs available   MRI 10/07/24 - No acute intracranial abnormality, small 3 mm meningioma without mass effect CXR 10/07/24 Small left pleural effusion and atelectasis Echo 10/06/24  EF 55-60%, grade I DD CT AP 10/06/24 Acute pyelonephritis, bilateral subpleural consolidation, chronic pancreatitis CT head 10/05/24 NAICA  Resolved problem list   Assessment and Plan   Acute metabolic encephalopathy Dementia Hx alcohol abuse Agitation/sundowning CT head 11/7 and MRI 11/9 unrevealing. No evidence of seizure. VPA level therapeutic and ammonia wnl. Unclear etiology. Electrolyte abnormalities would not explain severity of current presentation. Suspect his he may be at baseline with his dementia P: Continue Depakote  Appreciate Neurology input. EEG neg PRN Zyprexa for agitation Holding aricept  due to bradycardia  Acute hypoxemic respiratory failure 2/2 above Aspiration risk P: Extubated 11/13 Wean nasal cannula for goal SpO2 >88% Discussed aspiration risk. Family agrees to [place]  Septic shock secondary to Proteus UTI, pyelonephritis -resolved shock and lactic acidosis Hx ESBL UTI Admission cx + Proteus P: Completed Ancef for 7 days total  Diurese  Acute kidney failure - improving CFB+ P:  Monitor UOP/Cr BMET  DM2 Hyperglycemia P: Semglee 18 U nightly Schedule  novolog  5U every 4 hours SSI Start TF once dobhoff placed  Hypophosphatemia Replete  GOC DNR Palliative  following  Dispo: Patient previously lived at ALF. Discuss with CM SNF placement  Critical care time: N/A   Care Time: 50 min  Slater Staff, M.D. [facility] Pulmonary/Critical Care Medicine 10/12/2024 7:39 AM   See Amion for personal pager For hours between 7 PM to 7 AM, please call Elink for urgent questions

## 2024-10-12 NOTE — Evaluation (Signed)
 Clinical/Bedside Swallow Evaluation Patient Details  Name: Jeffery [name] MRN: [id number] Date of Birth: 11-20-1951  Today's Date: 10/12/2024 Time: SLP Start Time (ACUTE ONLY): 0930 SLP Stop Time (ACUTE ONLY): 0945 SLP Time Calculation (min) (ACUTE ONLY): 15 min  Past Medical History:  Past Medical History:  Diagnosis Date   Cancer (HCC)    prostate   Cervical spondylosis    Dementia associated with alcoholism (HCC)    from pt's neurology notes   Diabetes mellitus, type II (HCC)    Glaucoma    Hematuria    radiation cystitis   History of migraine headaches    Hx of arthroscopic knee surgery    Left knee   Hyperlipidemia    Hypertension    Hypertension    Hypertension    Impaired fasting glucose    Memory loss    Progressive gait disorder    Renal disorder    kidney stone   Varicose veins    Past Surgical History:  Past Surgical History:  Procedure Laterality Date   CHOLECYSTECTOMY     EYE SURGERY     PROSTATE SURGERY     HPI:  73 year old male who presented with sudden altered mental status.  Workup revealed septic shock, pyuria, bilateral pyelonephritis presumed septic encephalopathy.  CT head negative.  Initially  very somnolent with high secretion burden, intubated on 10/06/2024 from 11/12 when Palliative and family decided on one-way extubation.  Pt with a history of alcohol abuse, dementia, diabetes type 2, hypertension hyperlipidemia    Assessment / Plan / Recommendation  Clinical Impression  Pt shows no signs of dysphagia or aspiration, able to resume a regular diet and thin liquids. Pills as tolerated. SLP fed pt due to mittens in place and very restless behavior, constantly trying to pull at lines and get out of bed. Despite this, pt pleasant, interested in food and drink and able to follow one step commands as needed. Pt consumed 3 oz of water through a straw with no coughing. SLP will initiate diet and sign off. SLP Visit Diagnosis: Dysphagia, unspecified  (R13.10)    Aspiration Risk  Mild aspiration risk    Diet Recommendation Regular;Thin liquid    Liquid Administration via: Cup;Straw Medication Administration: Whole meds with liquid Supervision: Staff to assist with self feeding Compensations: Slow rate;Small sips/bites Postural Changes: Seated upright at 90 degrees    Other  Recommendations       Assistance Recommended at Discharge    Functional Status Assessment    Frequency and Duration            Prognosis        Swallow Study   General HPI: 73 year old male who presented with sudden altered mental status.  Workup revealed septic shock, pyuria, bilateral pyelonephritis presumed septic encephalopathy.  CT head negative.  Initially  very somnolent with high secretion burden, intubated on 10/06/2024 from 11/12 when Palliative and family decided on one-way extubation.  Pt with a history of alcohol abuse, dementia, diabetes type 2, hypertension hyperlipidemia Type of Study: Bedside Swallow Evaluation Previous Swallow Assessment: none Diet Prior to this Study: NPO;Cortrak/Small bore NG tube Respiratory Status: Nasal cannula History of Recent Intubation: Yes Total duration of intubation (days): 4 days Date extubated: 10/10/24 Behavior/Cognition: Alert;Cooperative;Confused;Distractible;Requires cueing Oral Cavity Assessment: Within Functional Limits Oral Care Completed by SLP: No Oral Cavity - Dentition: Adequate natural dentition Vision: Functional for self-feeding Self-Feeding Abilities: Other (Comment) (total assist given, mitts not removed) Patient Positioning: Upright in bed Baseline Vocal Quality:  Normal Volitional Cough: Strong Volitional Swallow: Able to elicit    Oral/Motor/Sensory Function Overall Oral Motor/Sensory Function: Within functional limits   Ice Chips     Thin Liquid Thin Liquid: Within functional limits    Nectar Thick Nectar Thick Liquid: Not tested   Honey Thick Honey Thick Liquid: Not tested    Puree Puree: Not tested   Solid     Solid: Within functional limits      Jury Caserta, Consuelo Fitch 10/12/2024,10:04 AM

## 2024-10-12 NOTE — NC FL2 (Signed)
 [place]  MEDICAID FL2 LEVEL OF CARE FORM     IDENTIFICATION  Patient Name: Jeffery [name] Birthdate: 1951/11/12 Sex: male Admission Date (Current Location): 10/05/2024  [facility] and [place] Number:  Producer, Television/film/video and Address:  The . [facility], [address] [place], [place] [address]      Provider Number: [id number]  Attending Physician Name and Address:  Kassie Acquanetta Bradley, MD  Relative Name and Phone Number:  [name]; Spouse; [phone]    Current Level of Care: Hospital Recommended Level of Care: Skilled Nursing Facility Prior Approval Number:    Date Approved/Denied:   PASRR Number: [id number]  Discharge Plan: SNF    Current Diagnoses: Patient Active Problem List   Diagnosis Date Noted   Hypotension 10/05/2024   Type II diabetes mellitus, uncontrolled 02/13/2020   Dementia associated with alcoholism (HCC) 02/13/2020   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   Insomnia disorder related to known organic factor 02/13/2020   Alzheimer's dementia with behavioral disturbance (HCC)    Essential hypertension    Hyperlipidemia    Type 2 diabetes mellitus with stage 3a chronic kidney disease, with long-term current use of insulin  (HCC)    Alcohol abuse    Seizures (HCC) 07/05/2019   Anemia due to blood loss, chronic 05/27/2018   Recurrent hematuria 05/27/2018   History of prostate cancer 05/27/2018   Acute urinary retention 05/10/2018   Memory disorder 12/13/2017   AKI (acute kidney injury) 02/16/2017   Hyponatremia 02/16/2017   Hypokalemia 02/15/2017   ED (erectile dysfunction) of organic origin 11/11/2016   Malignant neoplasm of prostate (HCC) 11/01/2014   Syncope and collapse 12/07/2012   Hyperthyroidism 12/07/2012   Other malaise and fatigue 12/07/2012   Disturbance of skin sensation 12/07/2012   Cervical spondylosis without myelopathy 12/07/2012   Abnormality of gait 12/07/2012   Cerebellar ataxia in diseases  classified elsewhere (HCC) 12/07/2012    Orientation RESPIRATION BLADDER Height & Weight        Normal (2L Nasal Cannula) Incontinent, External catheter Weight: 224 lb 3.3 oz (101.7 kg) Height:  6' 2 (188 cm)  BEHAVIORAL SYMPTOMS/MOOD NEUROLOGICAL BOWEL NUTRITION STATUS    Convulsions/Seizures (History of seizures) Incontinent Diet (Please see discharge summary)  AMBULATORY STATUS COMMUNICATION OF NEEDS Skin   Extensive Assist Verbally PU Stage and Appropriate Care (Wound 10/10/24 1700 Pressure Injury Arm Anterior;Distal;Lower;Right;Posterior Stage 2 - Partial thickness loss of dermis presenting as a shallow open injury with a red, pink wound bed without slough.)                       Personal Care Assistance Level of Assistance  Bathing, Feeding, Dressing Bathing Assistance: Maximum assistance Feeding assistance: Maximum assistance Dressing Assistance: Maximum assistance     Functional Limitations Info             SPECIAL CARE FACTORS FREQUENCY  PT (By licensed PT), OT (By licensed OT), Speech therapy     PT Frequency: 5x OT Frequency: 5x     Speech Therapy Frequency: 3x      Contractures Contractures Info: Not present    Additional Factors Info  Code Status, Allergies, Insulin  Sliding Scale Code Status Info: DNR-LIMITED -Do Not Intubate/DNI Allergies Info: Penicillins, Amlodipine , Atenolol, Clonidine Derivatives, Metformin  And Related, Tape   Insulin  Sliding Scale Info: Please see discharge summary       Current Medications (10/12/2024):  This is the current hospital active medication list Current Facility-Administered Medications  Medication  Dose Route Frequency Provider Last Rate Last Admin   acetaminophen  (TYLENOL ) tablet 650 mg  650 mg Oral Q6H PRN Adefeso, Oladapo, DO       Or   acetaminophen  (TYLENOL ) suppository 650 mg  650 mg Rectal Q6H PRN Adefeso, Oladapo, DO       [START ON 10/13/2024] aspirin  chewable tablet 81 mg  81 mg Oral Daily Kassie Acquanetta Bradley, MD       NOREEN ON 10/13/2024] atorvastatin  (LIPITOR) tablet 10 mg  10 mg Oral Daily Kassie Acquanetta Bradley, MD       Chlorhexidine Gluconate Cloth 2 % PADS 6 each  6 each Topical Q0600 Adefeso, Oladapo, DO   6 each at 10/12/24 1030   [COMPLETED] dextrose  50 % solution            docusate (COLACE) 50 MG/5ML liquid 100 mg  100 mg Oral BID Kassie Acquanetta Bradley, MD       enoxaparin  (LOVENOX ) injection 40 mg  40 mg Subcutaneous Q24H Bryn Motto B, MD   40 mg at 10/11/24 1406   feeding supplement (OSMOLITE 1.5 CAL) liquid 1,000 mL  1,000 mL Per Tube Continuous Ellison, Chi Jane, MD       feeding supplement (PROSource TF20) liquid 60 mL  60 mL Per Tube Daily Kassie Acquanetta Bradley, MD       insulin  aspart (novoLOG ) injection 0-15 Units  0-15 Units Subcutaneous Q4H Kassie Acquanetta Bradley, MD   3 Units at 10/12/24 1213   insulin  glargine-yfgn (SEMGLEE) injection 18 Units  18 Units Subcutaneous QHS Kassie Acquanetta Bradley, MD       NOREEN ON 10/13/2024] multivitamin with minerals tablet 1 tablet  1 tablet Oral Daily Kassie Acquanetta Bradley, MD       OLANZapine (ZYPREXA) injection 2.5 mg  2.5 mg Intramuscular Once PRN Kassie Acquanetta Bradley, MD       ondansetron  (ZOFRAN ) tablet 4 mg  4 mg Oral Q6H PRN Adefeso, Oladapo, DO       Or   ondansetron  (ZOFRAN ) injection 4 mg  4 mg Intravenous Q6H PRN Adefeso, Oladapo, DO       Oral care mouth rinse  15 mL Mouth Rinse Q2H Claudene Toribio BROCKS, MD   15 mL at 10/12/24 1214   Oral care mouth rinse  15 mL Mouth Rinse PRN Claudene Toribio BROCKS, MD       pantoprazole  (PROTONIX ) injection 40 mg  40 mg Intravenous QHS Claudene Toribio BROCKS, MD   40 mg at 10/11/24 2318   [START ON 10/13/2024] polyethylene glycol (MIRALAX  / GLYCOLAX ) packet 17 g  17 g Oral Daily Kassie Acquanetta Bradley, MD       sterile water (preservative free) injection            [START ON 10/13/2024] thiamine  (VITAMIN B1) tablet 100 mg  100 mg Oral Daily Kassie Acquanetta Bradley, MD       valproic acid (DEPAKENE) 250 MG/5ML solution 500 mg  500 mg Oral BID  Kassie Acquanetta Bradley, MD         Discharge Medications: Please see discharge summary for a list of discharge medications.  Relevant Imaging Results:  Relevant Lab Results:   Additional Information SSN [id number]  Lauraine FORBES Saa, LCSWA

## 2024-10-13 DIAGNOSIS — N179 Acute kidney failure, unspecified: Secondary | ICD-10-CM | POA: Diagnosis not present

## 2024-10-13 DIAGNOSIS — F03918 Unspecified dementia, unspecified severity, with other behavioral disturbance: Secondary | ICD-10-CM

## 2024-10-13 DIAGNOSIS — A419 Sepsis, unspecified organism: Secondary | ICD-10-CM | POA: Diagnosis not present

## 2024-10-13 DIAGNOSIS — J9601 Acute respiratory failure with hypoxia: Secondary | ICD-10-CM

## 2024-10-13 DIAGNOSIS — I959 Hypotension, unspecified: Secondary | ICD-10-CM | POA: Diagnosis not present

## 2024-10-13 LAB — GLUCOSE, CAPILLARY
Glucose-Capillary: 156 mg/dL — ABNORMAL HIGH (ref 70–99)
Glucose-Capillary: 167 mg/dL — ABNORMAL HIGH (ref 70–99)
Glucose-Capillary: 195 mg/dL — ABNORMAL HIGH (ref 70–99)
Glucose-Capillary: 202 mg/dL — ABNORMAL HIGH (ref 70–99)
Glucose-Capillary: 240 mg/dL — ABNORMAL HIGH (ref 70–99)
Glucose-Capillary: 263 mg/dL — ABNORMAL HIGH (ref 70–99)

## 2024-10-13 MED ORDER — PANTOPRAZOLE SODIUM 40 MG PO TBEC
40.0000 mg | DELAYED_RELEASE_TABLET | Freq: Every day | ORAL | Status: DC
  Administered 2024-10-13 – 2024-10-16 (×4): 40 mg via ORAL
  Filled 2024-10-13 (×4): qty 1

## 2024-10-13 NOTE — Progress Notes (Signed)
 Progress Note   Patient: Jeffery [name] FMW:[id number] DOB: 1951-04-11 DOA: 10/05/2024     8 DOS: the patient was seen and examined on 10/13/2024   Brief hospital admission narrative: 73 year old male with history of alcohol abuse, dementia, diabetes type 2, hypertension hyperlipidemia who presented with sudden altered mental status. Workup revealed septic shock, pyuria, bilateral pyelonephritis presumed septic encephalopathy. CT head negative. Unfortunately very somnolent with high secretion burden so PCCM consulted via telemetry consult on 10/06/2024. Due to need for EEG and MRI patient was transferred to [facility] and arrived overnight on 10/07/2024. MRI completed. With no signs of seizure activity EEG was held.   11/10 patient failed SBT, remains encephalopathic. 11/11 patient passed SBT 11/12 passed SBT.  Palliative service consultation and involvement has come from one-way extubation. 11/13 so far demonstrating good saturation without supplementation; current trach is still in place.  High risk for aspiration. 11/14 intermittent agitation has been reported.  Patient requiring IV Zyprexa.   Assessment and plan 1-acute metabolic encephalopathy/dementia/history of alcohol abuse/agitation and sundowning - Continue treatment with Depakote  - Appreciate inpatient's input by neurology service; EEG not demonstrating epileptic waves.  Positive for encephalopathy. - Continue as needed Zyprexa for agitation - Continue supportive care and constant reorientation. -Patient's home Aricept  has been discontinued in the setting of bradycardia.  2-acute hypoxemic respiratory failure -remains With high risk for aspiration - Good saturation on room air on today's examination -s/p dobhoof tube placement. -continue tube feedings.  3-septic shock secondary to Proteus UTI/pyelonephritis -Septic shock has resolved - Patient with history of ESBL UTI - Has completed antibiotic therapy using Ancef for 7  days. -Continue to maintain adequate hydration.  4-acute kidney failure - Continue to maintain adequate hydration - Follow renal function trend - Minimize nephrotoxic agents.  5-type 2 diabetes mellitus with hyperglycemia - Continue sliding scale insulin  and Semglee - Follow CBG fluctuation and adjust therapy as needed.  6-hypophosphatemia - Continue to follow electrolytes intermittently and replete as needed.  7-social/ethics - Patient DNR - Palliative service will continue to follow for further decisions regarding advance care planning. -Very high risk for decompensation and currently with guarded prognosis.  8-moderate protein calorie malnutrition - Continue feeding supplements and tube feeds at the moment. - Patient at risk for aspiration.  Subjective:  No fever, good oxygen saturation appreciated on room air.  No nausea or vomiting reported.  So far tolerating tube feedings.  Physical Exam: Vitals:   10/13/24 0322 10/13/24 0808 10/13/24 0821 10/13/24 1211  BP: 101/67 (!) 88/53 (!) 142/68 (!) 130/98  Pulse: 75 75  75  Resp: 17 17 17 18   Temp:  (!) 96.1 F (35.6 C)  (!) 96.1 F (35.6 C)  TempSrc: Oral Axillary  Axillary  SpO2: 98%     Weight:      Height:       General exam: Alert, awake, oriented x 1 intermittently; moving 4 limbs spontaneously.  No fever.   Respiratory system: Positive scattered rhonchi on exam; no wheezing or crackles.  Good saturation on room air. Cardiovascular system:RRR.  No rubs or gallops; no JVD. Gastrointestinal system: Abdomen is nondistended, soft and no guarding on exam.  Positive bowel sounds. Central nervous system: Moving 4 limbs spontaneously.  No focal neurological deficits.  (Limited examination secondary to underlying dementia). Extremities: No cyanosis or clubbing. Skin: No petechiae. Psychiatry: Judgement and insight appear impaired secondary to dementia.   Data Reviewed:   Family Communication: No family at  bedside.  Disposition: Status is:  Inpatient Remains inpatient appropriate because: Continue ongoing therapy and pending placement.  Time spent: 50 minutes  Author: Eric Nunnery, MD 10/13/2024 3:28 PM  For on call review [email or web address].

## 2024-10-13 NOTE — Plan of Care (Signed)
  Problem: Education: Goal: Knowledge of General Education information will improve Description: Including pain rating scale, medication(s)/side effects and non-pharmacologic comfort measures Outcome: Progressing   Problem: Health Behavior/Discharge Planning: Goal: Ability to manage health-related needs will improve Outcome: Progressing   Problem: Clinical Measurements: Goal: Ability to maintain clinical measurements within normal limits will improve Outcome: Progressing Goal: Will remain free from infection Outcome: Progressing Goal: Diagnostic test results will improve Outcome: Progressing Goal: Respiratory complications will improve Outcome: Progressing Goal: Cardiovascular complication will be avoided Outcome: Progressing   Problem: Activity: Goal: Risk for activity intolerance will decrease Outcome: Progressing   Problem: Nutrition: Goal: Adequate nutrition will be maintained Outcome: Progressing   Problem: Coping: Goal: Level of anxiety will decrease Outcome: Progressing   Problem: Elimination: Goal: Will not experience complications related to bowel motility Outcome: Progressing Goal: Will not experience complications related to urinary retention Outcome: Progressing   Problem: Pain Managment: Goal: General experience of comfort will improve and/or be controlled Outcome: Progressing   Problem: Safety: Goal: Ability to remain free from injury will improve Outcome: Progressing   Problem: Skin Integrity: Goal: Risk for impaired skin integrity will decrease Outcome: Progressing   Problem: Education: Goal: Ability to describe self-care measures that may prevent or decrease complications (Diabetes Survival Skills Education) will improve Outcome: Progressing Goal: Individualized Educational Video(s) Outcome: Progressing   Problem: Coping: Goal: Ability to adjust to condition or change in health will improve Outcome: Progressing   Problem: Fluid  Volume: Goal: Ability to maintain a balanced intake and output will improve Outcome: Progressing   Problem: Health Behavior/Discharge Planning: Goal: Ability to identify and utilize available resources and services will improve Outcome: Progressing Goal: Ability to manage health-related needs will improve Outcome: Progressing   Problem: Metabolic: Goal: Ability to maintain appropriate glucose levels will improve Outcome: Progressing   Problem: Nutritional: Goal: Maintenance of adequate nutrition will improve Outcome: Progressing Goal: Progress toward achieving an optimal weight will improve Outcome: Progressing   Problem: Skin Integrity: Goal: Risk for impaired skin integrity will decrease Outcome: Progressing   Problem: Tissue Perfusion: Goal: Adequacy of tissue perfusion will improve Outcome: Progressing   Problem: Activity: Goal: Ability to tolerate increased activity will improve Outcome: Progressing   Problem: Respiratory: Goal: Ability to maintain a clear airway and adequate ventilation will improve Outcome: Progressing   Problem: Role Relationship: Goal: Method of communication will improve Outcome: Progressing   Problem: Safety: Goal: Non-violent Restraint(s) Outcome: Progressing   Problem: Fluid Volume: Goal: Compliance with measures to maintain balanced fluid volume will improve Outcome: Progressing   Problem: Nutritional: Goal: Ability to make healthy dietary choices will improve Outcome: Progressing   Problem: Clinical Measurements: Goal: Complications related to the disease process, condition or treatment will be avoided or minimized Outcome: Progressing   Problem: Urinary Elimination: Goal: Signs and symptoms of infection will decrease Outcome: Progressing

## 2024-10-14 DIAGNOSIS — I959 Hypotension, unspecified: Secondary | ICD-10-CM | POA: Diagnosis not present

## 2024-10-14 DIAGNOSIS — N179 Acute kidney failure, unspecified: Secondary | ICD-10-CM | POA: Diagnosis not present

## 2024-10-14 DIAGNOSIS — F03918 Unspecified dementia, unspecified severity, with other behavioral disturbance: Secondary | ICD-10-CM | POA: Diagnosis not present

## 2024-10-14 DIAGNOSIS — A419 Sepsis, unspecified organism: Secondary | ICD-10-CM | POA: Diagnosis not present

## 2024-10-14 LAB — GLUCOSE, CAPILLARY
Glucose-Capillary: 177 mg/dL — ABNORMAL HIGH (ref 70–99)
Glucose-Capillary: 206 mg/dL — ABNORMAL HIGH (ref 70–99)
Glucose-Capillary: 217 mg/dL — ABNORMAL HIGH (ref 70–99)
Glucose-Capillary: 247 mg/dL — ABNORMAL HIGH (ref 70–99)
Glucose-Capillary: 261 mg/dL — ABNORMAL HIGH (ref 70–99)
Glucose-Capillary: 293 mg/dL — ABNORMAL HIGH (ref 70–99)

## 2024-10-14 NOTE — Plan of Care (Signed)
  Problem: Education: Goal: Knowledge of General Education information will improve Description: Including pain rating scale, medication(s)/side effects and non-pharmacologic comfort measures Outcome: Progressing   Problem: Health Behavior/Discharge Planning: Goal: Ability to manage health-related needs will improve Outcome: Progressing   Problem: Clinical Measurements: Goal: Ability to maintain clinical measurements within normal limits will improve Outcome: Progressing Goal: Will remain free from infection Outcome: Progressing Goal: Diagnostic test results will improve Outcome: Progressing Goal: Respiratory complications will improve Outcome: Progressing Goal: Cardiovascular complication will be avoided Outcome: Progressing   Problem: Activity: Goal: Risk for activity intolerance will decrease Outcome: Progressing   Problem: Nutrition: Goal: Adequate nutrition will be maintained Outcome: Progressing   Problem: Coping: Goal: Level of anxiety will decrease Outcome: Progressing   Problem: Elimination: Goal: Will not experience complications related to bowel motility Outcome: Progressing Goal: Will not experience complications related to urinary retention Outcome: Progressing   Problem: Pain Managment: Goal: General experience of comfort will improve and/or be controlled Outcome: Progressing   Problem: Safety: Goal: Ability to remain free from injury will improve Outcome: Progressing   Problem: Skin Integrity: Goal: Risk for impaired skin integrity will decrease Outcome: Progressing   Problem: Education: Goal: Ability to describe self-care measures that may prevent or decrease complications (Diabetes Survival Skills Education) will improve Outcome: Progressing Goal: Individualized Educational Video(s) Outcome: Progressing   Problem: Coping: Goal: Ability to adjust to condition or change in health will improve Outcome: Progressing   Problem: Fluid  Volume: Goal: Ability to maintain a balanced intake and output will improve Outcome: Progressing   Problem: Health Behavior/Discharge Planning: Goal: Ability to identify and utilize available resources and services will improve Outcome: Progressing Goal: Ability to manage health-related needs will improve Outcome: Progressing   Problem: Metabolic: Goal: Ability to maintain appropriate glucose levels will improve Outcome: Progressing   Problem: Nutritional: Goal: Maintenance of adequate nutrition will improve Outcome: Progressing Goal: Progress toward achieving an optimal weight will improve Outcome: Progressing   Problem: Skin Integrity: Goal: Risk for impaired skin integrity will decrease Outcome: Progressing   Problem: Tissue Perfusion: Goal: Adequacy of tissue perfusion will improve Outcome: Progressing   Problem: Activity: Goal: Ability to tolerate increased activity will improve Outcome: Progressing   Problem: Respiratory: Goal: Ability to maintain a clear airway and adequate ventilation will improve Outcome: Progressing   Problem: Role Relationship: Goal: Method of communication will improve Outcome: Progressing   Problem: Safety: Goal: Non-violent Restraint(s) Outcome: Progressing   Problem: Fluid Volume: Goal: Compliance with measures to maintain balanced fluid volume will improve Outcome: Progressing   Problem: Nutritional: Goal: Ability to make healthy dietary choices will improve Outcome: Progressing   Problem: Clinical Measurements: Goal: Complications related to the disease process, condition or treatment will be avoided or minimized Outcome: Progressing   Problem: Urinary Elimination: Goal: Signs and symptoms of infection will decrease Outcome: Progressing

## 2024-10-14 NOTE — TOC Progression Note (Signed)
 Transition of Care [facility]) - Progression Note    Patient Details  Name: Jeffery [name] MRN: [id number] Date of Birth: Jul 24, 1951  Transition of Care [facility]) CM/SW Contact  Isaiah Public, LCSWA Phone Number: 10/14/2024, 2:21 PM  Clinical Narrative:     Patient currently has one SNF bed offer with [facility]. CSW LVM with Kia with [facility] to confirm if facility has LTC bed. CSW awaiting call back once confirmed, CSW will follow to offer SNF choice.  Expected Discharge Plan: Skilled Nursing Facility Barriers to Discharge: English As A Second Language Teacher, Continued Medical Work up, SNF Pending bed offer               Expected Discharge Plan and Services In-house Referral: Clinical Social Work   Post Acute Care Choice: Skilled Nursing Facility Living arrangements for the past 2 months: Assisted Living Facility (Adult Care Home)                                       Social Drivers of Health (SDOH) Interventions SDOH Screenings   Food Insecurity: Patient Unable To Answer (10/06/2024)  Housing: Patient Unable To Answer (10/06/2024)  Transportation Needs: Patient Unable To Answer (10/06/2024)  Utilities: Patient Unable To Answer (10/06/2024)  Depression (PHQ2-9): Low Risk  (02/06/2021)  Social Connections: Patient Unable To Answer (10/06/2024)  Tobacco Use: Medium Risk (10/05/2024)    Readmission Risk Interventions    10/07/2024   10:01 AM 10/06/2024    8:24 AM  Readmission Risk Prevention Plan  Transportation Screening Complete Complete  HRI or Home Care Consult Complete Complete  Social Work Consult for Recovery Care Planning/Counseling Complete Complete  Palliative Care Screening Not Applicable Not Applicable  Medication Review Oceanographer) Complete Complete

## 2024-10-14 NOTE — Progress Notes (Signed)
 Progress Note   Patient: Jeffery [name] FMW:[id number] DOB: January 29, 1951 DOA: 10/05/2024     9 DOS: the patient was seen and examined on 10/14/2024   Brief hospital admission narrative: 73 year old male with history of alcohol abuse, dementia, diabetes type 2, hypertension hyperlipidemia who presented with sudden altered mental status. Workup revealed septic shock, pyuria, bilateral pyelonephritis presumed septic encephalopathy. CT head negative. Unfortunately very somnolent with high secretion burden so PCCM consulted via telemetry consult on 10/06/2024. Due to need for EEG and MRI patient was transferred to [facility] and arrived overnight on 10/07/2024. MRI completed. With no signs of seizure activity EEG was held.   11/10 patient failed SBT, remains encephalopathic. 11/11 patient passed SBT 11/12 passed SBT.  Palliative service consultation and involvement has come from one-way extubation. 11/13 so far demonstrating good saturation without supplementation; current trach is still in place.  High risk for aspiration. 11/14 intermittent agitation has been reported.  Patient requiring IV Zyprexa.   Assessment and plan 1-acute metabolic encephalopathy/dementia/history of alcohol abuse/agitation and sundowning - Continue treatment with Depakote  - Appreciate inpatient's input by neurology service; EEG not demonstrating epileptic waves.  Positive for encephalopathy. - Continue as needed Zyprexa for agitation - Continue supportive care and constant reorientation. -Patient's home Aricept  has been discontinued in the setting of bradycardia.  2-acute hypoxemic respiratory failure -remains With high risk for aspiration - Good saturation on room air on today's examination -s/p dobhoof tube placement. -continue tube feedings. - Advancing diet with intention to remove Dobbhoff tube in the next 24-48 hours.  3-septic shock secondary to Proteus UTI/pyelonephritis -Septic shock has resolved - Patient  with history of ESBL UTI - Has completed antibiotic therapy using Ancef for 7 days. -Continue to maintain adequate hydration.  4-acute kidney failure - Continue to maintain adequate hydration - Follow renal function trend - Minimize nephrotoxic agents.  5-type 2 diabetes mellitus with hyperglycemia - Continue sliding scale insulin  and Semglee - Follow CBG fluctuation and adjust therapy as needed.  6-hypophosphatemia - Continue to follow electrolytes intermittently and replete as needed.  7-social/ethics - Patient DNR - Palliative service will continue to follow for further decisions regarding advance care planning. -Very high risk for decompensation and currently with guarded prognosis.  8-moderate protein calorie malnutrition - Continue feeding supplements and tube feeds at the moment. - Patient at risk for aspiration. - Will start advancing diet in an effort to sound discontinue tube feedings.  Subjective:  No fever, good saturation on room air.  No nausea or vomiting reported.  Continues to experience intermittent episodes of agitation.  Physical Exam: Vitals:   10/14/24 0008 10/14/24 0427 10/14/24 0802 10/14/24 1204  BP: 106/66 99/79 (!) 105/49 118/75  Pulse: 68 67 67   Resp: 17 18 20 18   Temp:  98.1 F (36.7 C)    TempSrc: Oral Oral    SpO2: 95% 100% 100% 100%  Weight:      Height:       General exam: Alert, was able to follow simple commands intermittently; oriented x 1.  Overnight demonstrating agitation and requiring mittens/belt. Respiratory system: Good saturation on room air. Cardiovascular system: Rate controlled, no rubs, no gallops, no JVD. Gastrointestinal system: Abdomen is nondistended, soft and nontender.  Positive bowel sounds. Central nervous system: Moving 4 limbs spontaneously.  No focal neurological deficits.  Limited examination secondary to dementia. Extremities: No cyanosis, clubbing or edema. Skin: No petechiae. Psychiatry: Judgement and  insight appear impaired secondary to dementia.   Data Reviewed: CBG  fluctuation: 261 >> 217 >> 206 >> 177 >> 247  Family Communication: No family at bedside.  Disposition: Status is: Inpatient Remains inpatient appropriate because: Continue ongoing therapy and pending placement.  Time spent: 50 minutes  Author: Eric Nunnery, MD 10/14/2024 4:20 PM  For on call review [email or web address].

## 2024-10-14 NOTE — Progress Notes (Addendum)
   Palliative Medicine Inpatient Follow Up Note HPI: 73 year old man w/ hx of alcoholic dementia, DM2, HTN, HLD p/w sudden onset AMS. Workup revealed septic shock, pyuria, bilateral pyelo and presumed septic encephalopathy. CT head neg. Somnolent with high secretion burden so PCCM consulted - intubated, has not passed SBT's.    Palliative care has been asked to support GOC conversations.   Today's Discussion 10/14/2024  *Please note that this is a verbal dictation therefore any spelling or grammatical errors are due to the Dragon Medical One system interpretation.  Chart reviewed inclusive of vital signs, progress notes, laboratory results, and diagnostic images.   Per chart review, Jeffery [name] was successfully extubated and has been doing well on RA.   I met with Jeffery [name] this morning. He was awake and able to state his name though does not know where he is or why he is here. I was able to share with him that he is in the hospital due to severe illness. He says oh. He denies pain, shortness of breath, or nausea during my time with him at bedside. He is generally fidgety.   I spoke with Jeffery [name]'s RN, Lacey. We reviewed the importance of his eating and drinking as a step to get him closer to getting out of the hospital. She shares the plan to provide 1:1 feeding.   There is no family present at bedside however as of presently the plan is to transition to skilled nursing with the hope for long term care.   Questions and concerns addressed/Palliative Support Provided.   Objective Assessment: Vital Signs Vitals:   10/14/24 0427 10/14/24 0802  BP: 99/79 (!) 105/49  Pulse: 67 67  Resp: 18 20  Temp: 98.1 F (36.7 C)   SpO2: 100% 100%    Intake/Output Summary (Last 24 hours) at 10/14/2024 0820 Last data filed at 10/14/2024 9196 Gross per 24 hour  Intake 300 ml  Output 1420 ml  Net -1120 ml   Last Weight  Most recent update: 10/10/2024  9:48 AM    Weight  101.7 kg (224 lb 3.3 oz)             Gen: Elderly African-American male critically ill in appearance HEENT: [place], dry mucous membranes CV: Regular rate and rhythm PULM: On RA, breathing is even and nonlabored ABD: soft, nontender EXT: No edema Neuro:  Awake and alert to self  SUMMARY OF RECOMMENDATIONS   DNAR/DNI  Continue current care   Encourage 1:1 feedings with nursing staff  Ongoing PMT support ______________________________________________________________________________________ Rosaline Becton [place] Palliative Medicine Team Team Cell Phone: [phone] Please utilize secure chat with additional questions, if there is no response within 30 minutes please call the above phone number  Time: 69  Palliative Medicine Team providers are available by phone from 7am to 7pm daily and can be reached through the team cell phone.  Should this patient require assistance outside of these hours, please call the patient's attending physician.

## 2024-10-15 DIAGNOSIS — N1831 Chronic kidney disease, stage 3a: Secondary | ICD-10-CM | POA: Diagnosis not present

## 2024-10-15 DIAGNOSIS — E1122 Type 2 diabetes mellitus with diabetic chronic kidney disease: Secondary | ICD-10-CM | POA: Diagnosis not present

## 2024-10-15 DIAGNOSIS — G309 Alzheimer's disease, unspecified: Secondary | ICD-10-CM

## 2024-10-15 DIAGNOSIS — L8990 Pressure ulcer of unspecified site, unspecified stage: Secondary | ICD-10-CM

## 2024-10-15 DIAGNOSIS — F02818 Dementia in other diseases classified elsewhere, unspecified severity, with other behavioral disturbance: Secondary | ICD-10-CM

## 2024-10-15 DIAGNOSIS — I10 Essential (primary) hypertension: Secondary | ICD-10-CM | POA: Diagnosis not present

## 2024-10-15 DIAGNOSIS — N12 Tubulo-interstitial nephritis, not specified as acute or chronic: Secondary | ICD-10-CM | POA: Diagnosis not present

## 2024-10-15 LAB — GLUCOSE, CAPILLARY
Glucose-Capillary: 175 mg/dL — ABNORMAL HIGH (ref 70–99)
Glucose-Capillary: 181 mg/dL — ABNORMAL HIGH (ref 70–99)
Glucose-Capillary: 193 mg/dL — ABNORMAL HIGH (ref 70–99)
Glucose-Capillary: 200 mg/dL — ABNORMAL HIGH (ref 70–99)
Glucose-Capillary: 211 mg/dL — ABNORMAL HIGH (ref 70–99)
Glucose-Capillary: 240 mg/dL — ABNORMAL HIGH (ref 70–99)
Glucose-Capillary: 286 mg/dL — ABNORMAL HIGH (ref 70–99)

## 2024-10-15 NOTE — Assessment & Plan Note (Signed)
Continue local care

## 2024-10-15 NOTE — Progress Notes (Signed)
  Progress Note   Patient: Jeffery [name] FMW:[id number] DOB: [birth year] DOA: 10/05/2024     10 DOS: the patient was seen and examined on 10/15/2024   Brief hospital course: Jeffery [name] was admitted to the hospital with the working diagnosis septic shock due to urinary tract infection, pyelonephritis.   73 year old male with history of alcohol abuse, dementia, diabetes type 2, hypertension hyperlipidemia who presented with sudden altered mental status.  Workup revealed septic shock, pyuria, bilateral pyelonephritis presumed septic encephalopathy. CT head negative. Due to need for EEG and MRI patient was transferred from [facility] to [facility]. 11/08 patient not able to protect airway, and placed on invasive mechanical ventilation.    11/10 patient failed SBT, remains encephalopathic. 11/11 patient passed SBT 11/12 passed SBT.  Palliative service consultation and involvement has come from one-way extubation. 11/13 so far demonstrating good saturation without supplementation; current trach is still in place.  High risk for aspiration. 11/14 intermittent agitation has been reported.  Patient requiring IV Zyprexa. 11/15 transferred to [facility] 11/17 patient tolerating po well, plan to transfer to SNF.   Assessment and Plan: * Pyelonephritis Septic shock has resolved Acute metabolic encephalopathy has improved.  Plan to continue blood pressure monitoring   Essential hypertension Continue blood pressure monitoring, holding on antihypertensive agents due to risk of hypotension   Chronic kidney disease, stage 3a (HCC) AKI, baseline serum cr 1,4  Hypernatremia.   Renal function today with serum cr at 1.26 with K at 3,8 and serum bicarbonate at 28  Na 142   Type 2 diabetes mellitus with stage 3a chronic kidney disease, with long-term current use of insulin  (HCC) Continue glucose cover and monitoring with insulin  sliding scale and basal insulin  with glargine 18 units.  Continue statin therapy    Seizures (HCC) No signs of active seizures, continue with valproic acid.   Alzheimer's dementia with behavioral disturbance (HCC) Continue with olanzapine supportive medical therapy Continue thiamine  and nutritional support.  Off tube feedings, now.   Pressure injury of skin Continue local care.         Subjective: Patient tolerating po well with no nausea or vomiting, continue confused and gets agitated at night   Physical Exam: Vitals:   10/15/24 0429 10/15/24 0439 10/15/24 0802 10/15/24 1149  BP: (!) 157/141 134/85 124/74 (!) 110/59  Pulse: 71 75 (!) 52 (!) 53  Resp: 18 17 15 18   Temp:  97.8 F (36.6 C) (!) 97.4 F (36.3 C) (!) 97.5 F (36.4 C)  TempSrc:  Axillary Oral Oral  SpO2:  100% 95% 97%  Weight:      Height:       Neurology awake and alert, not interactive, but moving all 4 extremities ENT with mild pallor Cardiovascular with S1 and S2 present and regular with no gallops rubs or murmurs Respiratory with poor inspiratory effort with no wheezing or rhonchi  Abdomen with no distention  No lower extremity edema   Data Reviewed:    Family Communication: no family at the bedside   Disposition: Status is: Inpatient Remains inpatient appropriate because: resolving encephalopathy   Planned Discharge Destination: Skilled nursing facility      Author: Elidia Toribio Furnace, MD 10/15/2024 3:42 PM  For on call review [email or web address].

## 2024-10-15 NOTE — Hospital Course (Addendum)
 Jeffery [name] was admitted to the hospital with the working diagnosis septic shock due to urinary tract infection, pyelonephritis.   73 year old male with history of alcohol abuse, dementia, diabetes type 2, hypertension hyperlipidemia who presented with sudden altered mental status.  Workup revealed septic shock, pyuria, bilateral pyelonephritis presumed septic encephalopathy. CT head negative. Due to need for EEG and MRI patient was transferred from [facility] to [facility]. 11/08 patient not able to protect airway, and placed on invasive mechanical ventilation.    11/10 patient failed SBT, remains encephalopathic. 11/11 patient passed SBT 11/12 passed SBT.  Palliative service consultation and involvement has come from one-way extubation. 11/13 so far demonstrating good saturation without supplementation; current trach is still in place.  High risk for aspiration. 11/14 intermittent agitation has been reported.  Patient requiring IV Zyprexa. 11/15 transferred to [facility] 11/17 patient tolerating po well, plan to transfer to SNF.  11/18 patient medically stable for transfer to SNF

## 2024-10-15 NOTE — TOC Progression Note (Addendum)
 Transition of Care [facility]) - Progression Note    Patient Details  Name: Jeffery [name] MRN: [id number] Date of Birth: 04/18/1951  Transition of Care [facility]) CM/SW Contact  Luann SHAUNNA [place], [place] Phone Number: 10/15/2024, 9:50 AM  Clinical Narrative:     [facility] can no longer offer SNF bed. Pt has no other bed offers at this time. CSW will continue SNF search.   1120: [facility] reviewing referral   1515: [facility] does not have a bed at this time but their sister facility does([facility]). [facility] can offer a bed for short term rehab only and then pt could switch to LTC at [place] once a bed becomes available.   CSW called pt's spouse 2 times; phone answered but immediately hung up. Spouse not at bedside.   1610: Called pt's wife and left voicemail requesting return call.   Expected Discharge Plan: Skilled Nursing Facility Barriers to Discharge: English As A Second Language Teacher, Continued Medical Work up, SNF Pending bed offer               Expected Discharge Plan and Services In-house Referral: Clinical Social Work   Post Acute Care Choice: Skilled Nursing Facility Living arrangements for the past 2 months: Assisted Living Facility (Adult Care Home)                                       Social Drivers of Health (SDOH) Interventions SDOH Screenings   Food Insecurity: Patient Unable To Answer (10/06/2024)  Housing: Patient Unable To Answer (10/06/2024)  Transportation Needs: Patient Unable To Answer (10/06/2024)  Utilities: Patient Unable To Answer (10/06/2024)  Depression (PHQ2-9): Low Risk  (02/06/2021)  Social Connections: Patient Unable To Answer (10/06/2024)  Tobacco Use: Medium Risk (10/05/2024)    Readmission Risk Interventions    10/07/2024   10:01 AM 10/06/2024    8:24 AM  Readmission Risk Prevention Plan  Transportation Screening Complete Complete  HRI or Home Care Consult Complete Complete  Social Work Consult for Recovery Care  Planning/Counseling Complete Complete  Palliative Care Screening Not Applicable Not Applicable  Medication Review Oceanographer) Complete Complete

## 2024-10-15 NOTE — Assessment & Plan Note (Deleted)
 Continue with:

## 2024-10-15 NOTE — Plan of Care (Signed)
  Problem: Education: Goal: Knowledge of General Education information will improve Description: Including pain rating scale, medication(s)/side effects and non-pharmacologic comfort measures Outcome: Progressing   Problem: Health Behavior/Discharge Planning: Goal: Ability to manage health-related needs will improve Outcome: Progressing   Problem: Clinical Measurements: Goal: Ability to maintain clinical measurements within normal limits will improve Outcome: Progressing Goal: Will remain free from infection Outcome: Progressing Goal: Diagnostic test results will improve Outcome: Progressing Goal: Respiratory complications will improve Outcome: Progressing Goal: Cardiovascular complication will be avoided Outcome: Progressing   Problem: Activity: Goal: Risk for activity intolerance will decrease Outcome: Progressing   Problem: Nutrition: Goal: Adequate nutrition will be maintained Outcome: Progressing   Problem: Coping: Goal: Level of anxiety will decrease Outcome: Progressing   Problem: Elimination: Goal: Will not experience complications related to bowel motility Outcome: Progressing Goal: Will not experience complications related to urinary retention Outcome: Progressing   Problem: Pain Managment: Goal: General experience of comfort will improve and/or be controlled Outcome: Progressing   Problem: Safety: Goal: Ability to remain free from injury will improve Outcome: Progressing   Problem: Skin Integrity: Goal: Risk for impaired skin integrity will decrease Outcome: Progressing   Problem: Education: Goal: Ability to describe self-care measures that may prevent or decrease complications (Diabetes Survival Skills Education) will improve Outcome: Progressing Goal: Individualized Educational Video(s) Outcome: Progressing   Problem: Coping: Goal: Ability to adjust to condition or change in health will improve Outcome: Progressing   Problem: Fluid  Volume: Goal: Ability to maintain a balanced intake and output will improve Outcome: Progressing   Problem: Health Behavior/Discharge Planning: Goal: Ability to identify and utilize available resources and services will improve Outcome: Progressing Goal: Ability to manage health-related needs will improve Outcome: Progressing   Problem: Metabolic: Goal: Ability to maintain appropriate glucose levels will improve Outcome: Progressing   Problem: Nutritional: Goal: Maintenance of adequate nutrition will improve Outcome: Progressing Goal: Progress toward achieving an optimal weight will improve Outcome: Progressing   Problem: Skin Integrity: Goal: Risk for impaired skin integrity will decrease Outcome: Progressing   Problem: Tissue Perfusion: Goal: Adequacy of tissue perfusion will improve Outcome: Progressing   Problem: Activity: Goal: Ability to tolerate increased activity will improve Outcome: Progressing   Problem: Respiratory: Goal: Ability to maintain a clear airway and adequate ventilation will improve Outcome: Progressing   Problem: Role Relationship: Goal: Method of communication will improve Outcome: Progressing   Problem: Safety: Goal: Non-violent Restraint(s) Outcome: Progressing   Problem: Fluid Volume: Goal: Compliance with measures to maintain balanced fluid volume will improve Outcome: Progressing   Problem: Nutritional: Goal: Ability to make healthy dietary choices will improve Outcome: Progressing   Problem: Clinical Measurements: Goal: Complications related to the disease process, condition or treatment will be avoided or minimized Outcome: Progressing   Problem: Urinary Elimination: Goal: Signs and symptoms of infection will decrease Outcome: Progressing

## 2024-10-15 NOTE — Plan of Care (Signed)

## 2024-10-15 NOTE — Assessment & Plan Note (Signed)
 No signs of active seizures, continue with valproic acid.

## 2024-10-15 NOTE — Assessment & Plan Note (Signed)
 Continue glucose cover and monitoring with insulin  sliding scale and basal insulin  with glargine 18 units.  Continue statin therapy

## 2024-10-15 NOTE — Assessment & Plan Note (Addendum)
 AKI, baseline serum cr 1,4  Hypernatremia.   Renal function with serum cr at 1.26 with K at 3,8 and serum bicarbonate at 28  Na 142

## 2024-10-15 NOTE — Inpatient Diabetes Management (Signed)
 Inpatient Diabetes Program Recommendations  AACE/ADA: New Consensus Statement on Inpatient Glycemic Control  Target Ranges:  Prepandial:   less than 140 mg/dL      Peak postprandial:   less than 180 mg/dL (1-2 hours)      Critically ill patients:  140 - 180 mg/dL    Latest Reference Range & Units 10/14/24 00:12 10/14/24 05:51 10/14/24 09:10 10/14/24 12:02 10/14/24 15:52 10/14/24 20:01 10/15/24 00:06 10/15/24 04:27 10/15/24 07:58  Glucose-Capillary 70 - 99 mg/dL 752 (H) 822 (H) 793 (H) 217 (H) 261 (H) 293 (H) 193 (H) 175 (H) 181 (H)   Review of Glycemic Control  Diabetes history: DM2 Outpatient Diabetes medications: Basaglar 60 units QAM, Humalog  4-8 units TID with meals, Amaryl  2 mg QAM Current orders for Inpatient glycemic control: Semglee 18 units at bedtime, Novolog  0-15 units Q4H; Osmolite @ 55 ml/hr  Inpatient Diabetes Program Recommendations:    Insulin :  Please consider ordering Novolog  3 units Q4H for tube feeding coverage. If tube feeding is stopped or held then Novolog  tube feeding coverage should also be stopped or held.  Thanks, Earnie Gainer, RN, MSN, CDCES Diabetes Coordinator Inpatient Diabetes Program [phone] (Team Pager from 8am to 5pm)

## 2024-10-15 NOTE — Assessment & Plan Note (Signed)
 Septic shock has resolved Acute metabolic encephalopathy has improved.  Plan to continue blood pressure monitoring

## 2024-10-15 NOTE — Assessment & Plan Note (Signed)
 Continue blood pressure monitoring, holding on antihypertensive agents due to risk of hypotension

## 2024-10-15 NOTE — Assessment & Plan Note (Signed)
 Continue with olanzapine supportive medical therapy Continue thiamine  and nutritional support.  Off tube feedings, now.

## 2024-10-16 ENCOUNTER — Ambulatory Visit (INDEPENDENT_AMBULATORY_CARE_PROVIDER_SITE_OTHER): Admitting: Gastroenterology

## 2024-10-16 DIAGNOSIS — E1122 Type 2 diabetes mellitus with diabetic chronic kidney disease: Secondary | ICD-10-CM | POA: Diagnosis not present

## 2024-10-16 DIAGNOSIS — N1831 Chronic kidney disease, stage 3a: Secondary | ICD-10-CM | POA: Diagnosis not present

## 2024-10-16 DIAGNOSIS — Z515 Encounter for palliative care: Secondary | ICD-10-CM

## 2024-10-16 DIAGNOSIS — N12 Tubulo-interstitial nephritis, not specified as acute or chronic: Secondary | ICD-10-CM | POA: Diagnosis not present

## 2024-10-16 DIAGNOSIS — I10 Essential (primary) hypertension: Secondary | ICD-10-CM | POA: Diagnosis not present

## 2024-10-16 LAB — GLUCOSE, CAPILLARY
Glucose-Capillary: 188 mg/dL — ABNORMAL HIGH (ref 70–99)
Glucose-Capillary: 137 mg/dL — ABNORMAL HIGH (ref 70–99)
Glucose-Capillary: 189 mg/dL — ABNORMAL HIGH (ref 70–99)
Glucose-Capillary: 170 mg/dL — ABNORMAL HIGH (ref 70–99)
Glucose-Capillary: 118 mg/dL — ABNORMAL HIGH (ref 70–99)

## 2024-10-16 MED ORDER — ENSURE PLUS HIGH PROTEIN PO LIQD
237.0000 mL | Freq: Two times a day (BID) | ORAL | Status: DC
  Administered 2024-10-17 (×2): 237 mL via ORAL

## 2024-10-16 MED ORDER — ORAL CARE MOUTH RINSE: 15.0000 mL | OROMUCOSAL | Status: DC | PRN

## 2024-10-16 NOTE — Plan of Care (Signed)
   Palliative Medicine Inpatient Follow Up Note   Received a call from patients daughter, Monta requested an update on the discharge plan.   I shared the information from MSW - Cordella Tpzmij'd note.   All questions answered to the satisfaction of patients daughter.   No Charge  ______________________________________________________________________________________ Rosaline Becton [place] Palliative Medicine Team Team Cell Phone: [phone] Please utilize secure chat with additional questions, if there is no response within 30 minutes please call the above phone number  Palliative Medicine Team providers are available by phone from 7am to 7pm daily and can be reached through the team cell phone.  Should this patient require assistance outside of these hours, please call the patient's attending physician.

## 2024-10-16 NOTE — Care Management Important Message (Signed)
 Important Message  Patient Details  Name: Jeffery [name] MRN: [id number] Date of Birth: [birth year]   Important Message Given:  Yes - Medicare IM     [name] 10/16/2024, 1:13 PM

## 2024-10-16 NOTE — Progress Notes (Signed)
 Nutrition Follow-up  DOCUMENTATION CODES:  Not applicable  INTERVENTION:  Thiamine  and MVI with minerals daily Ensure Plus High Protein po BID, each supplement provides 350 kcal and 20 grams of protein. Regular diet per SLP Automatic trays Feeding assistance  NUTRITION DIAGNOSIS:  Inadequate oral intake related to acute illness, poor appetite as evidenced by meal completion < 50%. - remains applicable   GOAL:  Patient will meet greater than or equal to 90% of their needs - progressing  MONITOR:  PO intake, Supplement acceptance, Labs, Weight trends  REASON FOR ASSESSMENT:  Consult Enteral/tube feeding initiation and management  ASSESSMENT:  73 year old man w/ hx of alcoholic dementia, DM2, HTN, HLD, CKD 3b, from Group home who presented with sudden onset AMS and unresponsiveness. Workup revealed septic shock, hypoglycemia, hypotensions, pyuria, bilateral pyelo and presumed septic encephalopathy.  11/8 - Admitted to ICU, intubated  11/13 - pt one-way extubated to nasal cannula 11/14 - cortrak placed gastric 11/17 - cortrak removed   Patient in room working with PT. Tube feeds were stopped on 11/16 and cortrak removed. Pt currently on regular diet, ~ 50% po intake documented x 5 meals in flowsheet. Pt oriented x 1, alzheimer's at baseline. No supplements ordered.   Admit weight: 100.7 kg  Current weight: 101.7 kg    Nutritionally Relevant Medications:  SSI 0-15 units q 4 hrs, Semglee 18 units daily, MVI w/minerals, thiamine , miralax , colace  Labs Reviewed: CBG 137 - 286  NUTRITION - FOCUSED PHYSICAL EXAM: Flowsheet Row Most Recent Value  Orbital Region No depletion  Upper Arm Region No depletion  Thoracic and Lumbar Region No depletion  Buccal Region No depletion  Temple Region No depletion  Clavicle Bone Region No depletion  Clavicle and Acromion Bone Region Mild depletion  Scapular Bone Region No depletion  Dorsal Hand Unable to assess  [restraints/mittens]   Patellar Region No depletion  Anterior Thigh Region No depletion  Posterior Calf Region No depletion  Edema (RD Assessment) Mild  [generalized]  Hair Reviewed  Eyes Reviewed  Mouth Reviewed  Skin Reviewed  Nails Unable to assess  [mittens]    Diet Order:   Diet Order             Diet regular Room service appropriate? No; Fluid consistency: Thin  Diet effective now                   EDUCATION NEEDS:  Not appropriate for education at this time  Skin:  Skin Assessment: Reviewed RN Assessment Stage 2: Right arm, (1 x 1.5 cm)  Last BM:  11/18  Height:  Ht Readings from Last 1 Encounters:  10/06/24 6' 2 (1.88 m)    Weight:  Wt Readings from Last 1 Encounters:  10/10/24 101.7 kg    Ideal Body Weight:  86.4 kg  BMI:  Body mass index is 28.79 kg/m.  Estimated Nutritional Needs:  Kcal:  2000-2200 kcal/d Protein:  95-110g/d Fluid:  >2L/day  Yuleimy Kretz, MS, RD, LDN Clinical Dietitian  Contact via secure chat. If unavailable, use group chat RD Inpatient.

## 2024-10-16 NOTE — Progress Notes (Signed)
 Patient arrived in room from 4 east,oriented to self, has noted abrasions on right forearm dried wounds and long pattern of red streak on right lower forearm.skin assessment done with Mahima RN.

## 2024-10-16 NOTE — TOC Progression Note (Addendum)
 Transition of Care [facility]) - Progression Note    Patient Details  Name: Jeffery [name] MRN: [id number] Date of Birth: [birth year]  Transition of Care [facility]) CM/SW Contact  Bridget Cordella Simmonds, LCSW Phone Number: 10/16/2024, 12:02 PM  Clinical Narrative:   Pt oriented x1.  CSW spoke with pt wife Merlynn, discussed one bed offer at [facility].  Wife is agreeable to accept this offer.  Also discussed possibility of transfer to [facility] for LTC when bed is available there.  Wife is interested in this also.  CSW confirmed with [facility]: [facility] can receive pt.  New PT note needed for insurance auth, PT aware.   1540: At palliative request, update provided to pt daughter Gloria.  1545: SNF auth request submitted in [employer].    Expected Discharge Plan: Skilled Nursing Facility Barriers to Discharge: English As A Second Language Teacher, Continued Medical Work up, SNF Pending bed offer               Expected Discharge Plan and Services In-house Referral: Clinical Social Work   Post Acute Care Choice: Skilled Nursing Facility Living arrangements for the past 2 months: Assisted Living Facility (Adult Care Home)                                       Social Drivers of Health (SDOH) Interventions SDOH Screenings   Food Insecurity: Patient Unable To Answer (10/06/2024)  Housing: Patient Unable To Answer (10/06/2024)  Transportation Needs: Patient Unable To Answer (10/06/2024)  Utilities: Patient Unable To Answer (10/06/2024)  Depression (PHQ2-9): Low Risk  (02/06/2021)  Social Connections: Patient Unable To Answer (10/06/2024)  Tobacco Use: Medium Risk (10/05/2024)    Readmission Risk Interventions    10/07/2024   10:01 AM 10/06/2024    8:24 AM  Readmission Risk Prevention Plan  Transportation Screening Complete Complete  HRI or Home Care Consult Complete Complete  Social Work Consult for Recovery Care Planning/Counseling Complete Complete  Palliative Care Screening Not Applicable  Not Applicable  Medication Review Oceanographer) Complete Complete

## 2024-10-16 NOTE — Progress Notes (Addendum)
 Report given to receiving unit nurse, Aeven,RN.  Pt transferred to 5N21 by bed with belongings. Pt alert and oriented to self. Pt observed to be comfortable.

## 2024-10-16 NOTE — Plan of Care (Signed)
  Problem: Education: Goal: Knowledge of General Education information will improve Description: Including pain rating scale, medication(s)/side effects and non-pharmacologic comfort measures Outcome: Progressing   Problem: Health Behavior/Discharge Planning: Goal: Ability to manage health-related needs will improve Outcome: Progressing   Problem: Clinical Measurements: Goal: Ability to maintain clinical measurements within normal limits will improve Outcome: Progressing Goal: Will remain free from infection Outcome: Progressing Goal: Diagnostic test results will improve Outcome: Progressing Goal: Respiratory complications will improve Outcome: Progressing Goal: Cardiovascular complication will be avoided Outcome: Progressing   Problem: Activity: Goal: Risk for activity intolerance will decrease Outcome: Progressing   Problem: Nutrition: Goal: Adequate nutrition will be maintained Outcome: Progressing   Problem: Coping: Goal: Level of anxiety will decrease Outcome: Progressing   Problem: Elimination: Goal: Will not experience complications related to bowel motility Outcome: Progressing Goal: Will not experience complications related to urinary retention Outcome: Progressing   Problem: Pain Managment: Goal: General experience of comfort will improve and/or be controlled Outcome: Progressing   Problem: Safety: Goal: Ability to remain free from injury will improve Outcome: Progressing   Problem: Skin Integrity: Goal: Risk for impaired skin integrity will decrease Outcome: Progressing   Problem: Education: Goal: Ability to describe self-care measures that may prevent or decrease complications (Diabetes Survival Skills Education) will improve Outcome: Progressing Goal: Individualized Educational Video(s) Outcome: Progressing   Problem: Coping: Goal: Ability to adjust to condition or change in health will improve Outcome: Progressing   Problem: Fluid  Volume: Goal: Ability to maintain a balanced intake and output will improve Outcome: Progressing   Problem: Health Behavior/Discharge Planning: Goal: Ability to identify and utilize available resources and services will improve Outcome: Progressing Goal: Ability to manage health-related needs will improve Outcome: Progressing   Problem: Metabolic: Goal: Ability to maintain appropriate glucose levels will improve Outcome: Progressing   Problem: Nutritional: Goal: Maintenance of adequate nutrition will improve Outcome: Progressing Goal: Progress toward achieving an optimal weight will improve Outcome: Progressing   Problem: Skin Integrity: Goal: Risk for impaired skin integrity will decrease Outcome: Progressing   Problem: Tissue Perfusion: Goal: Adequacy of tissue perfusion will improve Outcome: Progressing   Problem: Activity: Goal: Ability to tolerate increased activity will improve Outcome: Progressing   Problem: Respiratory: Goal: Ability to maintain a clear airway and adequate ventilation will improve Outcome: Progressing   Problem: Role Relationship: Goal: Method of communication will improve Outcome: Progressing   Problem: Safety: Goal: Non-violent Restraint(s) Outcome: Progressing   Problem: Fluid Volume: Goal: Compliance with measures to maintain balanced fluid volume will improve Outcome: Progressing   Problem: Nutritional: Goal: Ability to make healthy dietary choices will improve Outcome: Progressing   Problem: Clinical Measurements: Goal: Complications related to the disease process, condition or treatment will be avoided or minimized Outcome: Progressing   Problem: Urinary Elimination: Goal: Signs and symptoms of infection will decrease Outcome: Progressing

## 2024-10-16 NOTE — Progress Notes (Signed)
 Scheduled Valproic Acid has not been received from Pharmacy.  This RN has been in contact with Pharmacy in regards to this medication.  Waiting on delivery.

## 2024-10-16 NOTE — Progress Notes (Signed)
 Physical Therapy Treatment Patient Details Name: Jeffery [name] MRN: [id number] DOB: 07/27/1951 Today's Date: 10/16/2024   History of Present Illness 73 year old who presented to [facility] on 11/9 with sudden altered mental status.  Workup revealed septic shock, pyuria, bilateral pyelonephritis presumed septic encephalopathy.  CT head negative.  Initially  very somnolent with high secretion burden, intubated on 10/06/2024 from 11/12 when Palliative and family decided on one-way extubation.  Pt with a history of alcohol abuse, dementia, diabetes type 2, hypertension hyperlipidemia    PT Comments  Pt is currently progressing towards goals. Pt Max A to Min A for bed mobility and Max A for sit to stand at EOB with face to face assist and heavy multi modal cues. Pt is limited by motor planning and initiation. Due to pt current functional status, home set up and available assistance at home recommending skilled physical therapy services < 3 hours/day in order to address strength, balance and functional mobility to decrease risk for falls, injury, immobility, skin break down and re-hospitalization.      If plan is discharge home, recommend the following: A little help with walking and/or transfers;Help with stairs or ramp for entrance;Assist for transportation;Assistance with cooking/housework;Supervision due to cognitive status   Can travel by private vehicle     No  Equipment Recommendations  Wheelchair cushion (measurements PT);Wheelchair (measurements PT);Hoyer lift;Hospital bed       Precautions / Restrictions Precautions Precautions: Fall Recall of Precautions/Restrictions: Impaired Restrictions Weight Bearing Restrictions Per Provider Order: No     Mobility  Bed Mobility Overal bed mobility: Needs Assistance Bed Mobility: Supine to Sit, Sit to Supine     Supine to sit: Max assist Sit to supine: Min assist   General bed mobility comments: Max A to initiate sitting EOB; pt then able to  begin the movement. Pt Min A for initiating sitting to supine    Transfers Overall transfer level: Needs assistance Equipment used: Rolling walker (2 wheels), None Transfers: Sit to/from Stand Sit to Stand: Max assist           General transfer comment: Pt attempted to stand wtih RW but unable to assist. Performed standing face to face sit to stand wtih rocking/counting pt able to stand fully at Max A with heavy multi modal cues for initiation of movement.    Ambulation/Gait     General Gait Details: unable at this time.     Balance Overall balance assessment: Needs assistance Sitting-balance support: No upper extremity supported, Feet supported Sitting balance-Leahy Scale: Poor Sitting balance - Comments: intermittent Min A sitting EOB pt able to sit ~ 10 min at close SBA Postural control: Posterior lean   Standing balance-Leahy Scale: Zero Standing balance comment: Max A to maintain standing balance.      Communication Communication Communication: No apparent difficulties  Cognition Arousal: Alert Behavior During Therapy: Anxious, Flat affect, Restless   PT - Cognitive impairments: History of cognitive impairments, Problem solving, Safety/Judgement, Awareness, Memory, Orientation, Attention, Initiation, Sequencing   Orientation impairments: Place, Time, Situation   Following commands: Impaired Following commands impaired: Follows one step commands inconsistently    Cueing Cueing Techniques: Verbal cues, Gestural cues, Tactile cues, Visual cues     General Comments General comments (skin integrity, edema, etc.): No signs/symptoms of cardiac/respiratory distress during activity      Pertinent Vitals/Pain Pain Assessment Pain Assessment: Faces Faces Pain Scale: No hurt Breathing: normal Negative Vocalization: none Facial Expression: smiling or inexpressive Body Language: relaxed Consolability: no need to console  PAINAD Score: 0 Pain Intervention(s):  Monitored during session     PT Goals (current goals can now be found in the care plan section) Acute Rehab PT Goals PT Goal Formulation: Patient unable to participate in goal setting Time For Goal Achievement: 10/12/24 Potential to Achieve Goals: Fair Progress towards PT goals: Progressing toward goals    Frequency    Min 2X/week      PT Plan  Continue with current POC        AM-PAC PT 6 Clicks Mobility   Outcome Measure  Help needed turning from your back to your side while in a flat bed without using bedrails?: A Lot Help needed moving from lying on your back to sitting on the side of a flat bed without using bedrails?: A Lot Help needed moving to and from a bed to a chair (including a wheelchair)?: A Lot Help needed standing up from a chair using your arms (e.g., wheelchair or bedside chair)?: A Lot Help needed to walk in hospital room?: Total Help needed climbing 3-5 steps with a railing? : Total 6 Click Score: 10    End of Session Equipment Utilized During Treatment: Gait belt Activity Tolerance: Patient tolerated treatment well Patient left: in bed;with call bell/phone within reach;with bed alarm set Nurse Communication: Mobility status;Other (comment) (pt was assisted wtih cleaning BM but still going to the bathroom, pt was also fed some of his meal due to he needs to be fed and it had been 1.5 hours since meal was delivered. RN/NT notified that pt needs to be cleaned and fed the rest of meal.) PT Visit Diagnosis: Other abnormalities of gait and mobility (R26.89)     Time: 8591-8565 PT Time Calculation (min) (ACUTE ONLY): 26 min  Charges:    $Therapeutic Activity: 23-37 mins PT General Charges $$ ACUTE PT VISIT: 1 Visit                     Dorothyann Maier, DPT, CLT  Acute Rehabilitation Services Office: [phone] (Secure chat preferred)    Dorothyann VEAR Maier 10/16/2024, 3:24 PM

## 2024-10-16 NOTE — Progress Notes (Signed)
  Progress Note   Patient: Jeffery [name] FMW:[id number] DOB: [birth year] DOA: 10/05/2024     11 DOS: the patient was seen and examined on 10/16/2024   Brief hospital course: Mr. Baba was admitted to the hospital with the working diagnosis septic shock due to urinary tract infection, pyelonephritis.   73 year old male with history of alcohol abuse, dementia, diabetes type 2, hypertension hyperlipidemia who presented with sudden altered mental status.  Workup revealed septic shock, pyuria, bilateral pyelonephritis presumed septic encephalopathy. CT head negative. Due to need for EEG and MRI patient was transferred from [facility] to [facility]. 11/08 patient not able to protect airway, and placed on invasive mechanical ventilation.    11/10 patient failed SBT, remains encephalopathic. 11/11 patient passed SBT 11/12 passed SBT.  Palliative service consultation and involvement has come from one-way extubation. 11/13 so far demonstrating good saturation without supplementation; current trach is still in place.  High risk for aspiration. 11/14 intermittent agitation has been reported.  Patient requiring IV Zyprexa. 11/15 transferred to [facility] 11/17 patient tolerating po well, plan to transfer to SNF.  11/18 patient medically stable for transfer to SNF   Assessment and Plan: * Pyelonephritis Septic shock has resolved Acute metabolic encephalopathy has improved.  Plan to continue blood pressure monitoring   Essential hypertension Continue blood pressure monitoring, holding on antihypertensive agents due to risk of hypotension   Chronic kidney disease, stage 3a (HCC) AKI, baseline serum cr 1,4  Hypernatremia.   Renal function with serum cr at 1.26 with K at 3,8 and serum bicarbonate at 28  Na 142   Type 2 diabetes mellitus with stage 3a chronic kidney disease, with long-term current use of insulin  (HCC) Continue glucose cover and monitoring with insulin  sliding scale and basal insulin  with glargine  18 units.  Capillary glucose today 188, 137, 189 and 170  Continue statin therapy   Seizures (HCC) No signs of active seizures, continue with valproic acid.   Alzheimer's dementia with behavioral disturbance (HCC) Continue with olanzapine and supportive medical therapy Continue thiamine  and nutritional support.  Tolerating po well.    Pressure injury of skin Continue local care.      Subjective: Patient poorly interactive, responds to simple questions, not in apparent pain   Physical Exam: Vitals:   10/15/24 2345 10/16/24 0421 10/16/24 0517 10/16/24 0700  BP: (!) 145/76 122/65 118/66 123/83  Pulse: 62 61 (!) 55 (!) 55  Resp: 17 16 16 16   Temp:  98 F (36.7 C) 97.6 F (36.4 C) 98.2 F (36.8 C)  TempSrc: Oral Oral Oral Oral  SpO2: 99% 94% 100% 98%  Weight:      Height:       Neurology awake and alert, poorly interactive, responds to simple questions and follows simple commands ENT with mild pallor Cardiovascular with S1 and S2 present and regular with no gallops or rubs Respiratory with no rales or wheezing Abdomen with no distention  No lower extremity edema   Data Reviewed:    Family Communication: no family at the bedside   Disposition: Status is: Inpatient Remains inpatient appropriate because: pending transfer to SNF   Planned Discharge Destination: Skilled nursing facility     Author: Elidia Toribio Furnace, MD 10/16/2024 5:10 PM  For on call review [email or web address].

## 2024-10-17 DIAGNOSIS — N390 Urinary tract infection, site not specified: Secondary | ICD-10-CM | POA: Diagnosis not present

## 2024-10-17 DIAGNOSIS — J9602 Acute respiratory failure with hypercapnia: Secondary | ICD-10-CM | POA: Insufficient documentation

## 2024-10-17 DIAGNOSIS — A419 Sepsis, unspecified organism: Secondary | ICD-10-CM | POA: Diagnosis not present

## 2024-10-17 DIAGNOSIS — R6521 Severe sepsis with septic shock: Secondary | ICD-10-CM | POA: Diagnosis not present

## 2024-10-17 DIAGNOSIS — G9341 Metabolic encephalopathy: Secondary | ICD-10-CM | POA: Insufficient documentation

## 2024-10-17 LAB — GLUCOSE, CAPILLARY
Glucose-Capillary: 78 mg/dL (ref 70–99)
Glucose-Capillary: 60 mg/dL — ABNORMAL LOW (ref 70–99)
Glucose-Capillary: 86 mg/dL (ref 70–99)
Glucose-Capillary: 171 mg/dL — ABNORMAL HIGH (ref 70–99)
Glucose-Capillary: 173 mg/dL — ABNORMAL HIGH (ref 70–99)

## 2024-10-17 MED ORDER — POLYETHYLENE GLYCOL 3350 17 G PO PACK: 17.0000 g | PACK | Freq: Every day | ORAL | Status: AC

## 2024-10-17 MED ORDER — ENSURE PLUS HIGH PROTEIN PO LIQD: 237.0000 mL | Freq: Two times a day (BID) | ORAL | Status: AC

## 2024-10-17 MED ORDER — DIVALPROEX SODIUM 500 MG PO DR TAB
500.0000 mg | DELAYED_RELEASE_TABLET | Freq: Two times a day (BID) | ORAL | Status: DC
  Filled 2024-10-17: qty 1

## 2024-10-17 MED ORDER — INSULIN GLARGINE-YFGN 100 UNIT/ML ~~LOC~~ SOLN: 16.0000 [IU] | Freq: Every day | SUBCUTANEOUS | Status: AC

## 2024-10-17 MED ORDER — DOCUSATE SODIUM 50 MG/5ML PO LIQD: 100.0000 mg | Freq: Two times a day (BID) | ORAL | Status: AC

## 2024-10-17 MED ORDER — ADULT MULTIVITAMIN W/MINERALS CH: 1.0000 | ORAL_TABLET | Freq: Every day | ORAL | Status: AC

## 2024-10-17 MED ORDER — FOLIC ACID 1 MG PO TABS: 1.0000 mg | ORAL_TABLET | Freq: Every day | ORAL | Status: DC

## 2024-10-17 MED ORDER — VITAMIN B-1 100 MG PO TABS: 100.0000 mg | ORAL_TABLET | Freq: Every day | ORAL | Status: AC

## 2024-10-17 NOTE — Discharge Summary (Signed)
 Physician Discharge Summary   Patient: Jeffery [name] MRN: [id number] DOB: Jun 17, 1951  Admit date:     10/05/2024  Discharge date:   Discharge Physician: Duffy Al-Sultani   PCP: Sheryle Carwin, MD   Recommendations at discharge:   Follow up with repeat CBC and CMP to ensure stable Hgb levels, electrolytes including Na and K, as well as renal and liver function Follow up with outpatient neurology after SNF for continued management of dementia  Discharge Diagnoses: Principal Problem:   Septic shock due to urinary tract infection (HCC) Active Problems:   Pyelonephritis   Essential hypertension   Chronic kidney disease, stage 3a (HCC)   Type 2 diabetes mellitus with stage 3a chronic kidney disease, with long-term current use of insulin  (HCC)   Seizures (HCC)   Alzheimer's dementia with behavioral disturbance (HCC)   Pressure injury of skin   Palliative care by specialist   Acute hypercapnic respiratory failure (HCC)   Septic encephalopathy   Hospital Course:  The patient is a 73 year old male with PMHx of HTN, T2DM, dementia, seizure disorder, history of alcohol abuse, prostate cancer s/p prostatectomy and radiation who presented to [facility] ED from [facility] family care in [place] via EMS due to unresponsiveness. He was found to have lactic acidosis, pyuria, hypoglycemia, hypotension despite IV fluid resuscitation and initiation of broad-spectrum antibiotics, as well as hyponatremia, hyperkalemia, and acute hypercapnic respiratory failure culminating in acute metabolic encephalopathy.  He required pressor support and initially BiPAP, but then was intubated due to inability to maintain BiPAP secondary to secretions and mental status depression.  He was admitted to the ICU for septic shock secondary to Proteus mirabilis pyelonephritis, which was confirmed on CT abdomen pelvis. Initially treated with meropenem but ultimately narrowed down to cefazolin.  Blood cultures remained negative.  He was  transferred to [facility] due to need for EEG and MRI. Patient was ultimately weaned off pressors.  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  Neurology evaluated the patient.  EEG showed generalized cerebral dysfunction with no seizures or epileptiform discharges. Folate was noted at 5.1 and the patient was started on folate supplementation with his multivitamin.  Palliative care was also involved in the patient's care and the patient underwent one-way extubation on 10/11/2024. Renal function ultimately improved with resolution of shock, with creatinine improving to baseline of 1.26. His clinical course was complicated by subacute delirium and required PRN Zyprexa. Home Aricept  was held due to bradycardia.  He required Dobbhoff tube placement for tube feeds briefly, with diet advanced and Dobbhoff removed on 10/15/2024. He was maintained on IV then liquid solutions of Depakote  throughout his hospitalization without any seizure activity. Given resolution of his septic shock, acute metabolic encephalopathy, renal failure, completion of antibiotics for pyelonephritis, and ability to tolerate PO intake, while remaining hemodynamically stable, the patient was deemed medically appropriate for transfer to SNF.  Patient was afebrile with HR 63, RR 16, BP 126/95, SpO2 100% on RA.   Consultants: PCCM, neurology, palliative care Procedures performed: intubation, EEG, cortrak placement  Disposition: Skilled nursing facility Diet recommendation:  Diet Orders (From admission, onward)     Start     Ordered   10/17/24 0000  Diet - low sodium heart healthy  10/17/24 1415   10/12/24 1320  Diet regular Room  service appropriate? No; Fluid consistency: Thin  Diet effective now       Question Answer Comment  Room service appropriate? No   Fluid consistency: Thin      10/12/24 1319            DISCHARGE MEDICATION: Allergies as of 10/17/2024       Reactions   Penicillins Hives, Nausea And Vomiting, Swelling   Swelling of lips Has patient had a PCN reaction causing immediate rash, facial/tongue/throat swelling, SOB or lightheadedness with hypotension: Yes Has patient had a PCN reaction causing severe rash involving mucus membranes or skin necrosis: No Has patient had a PCN reaction that required hospitalization No Has patient had a PCN reaction occurring within the last 10 years: No If all of the above answers are NO, then may proceed with Cephalosporin use.   Amlodipine  Swelling   Caused swelling in feet and legs   Atenolol Swelling, Other (See Comments)   Angioedema   Clonidine Derivatives Swelling, Other (See Comments)   Angioedema   Metformin  And Related    Irritable   Tape Rash   No plastic, patterned tape!!        Medication List     STOP taking these medications    Basaglar KwikPen 100 UNIT/ML   clotrimazole  1 % cream Commonly known as: LOTRIMIN    donepezil  10 MG tablet Commonly known as: ARICEPT    glimepiride  2 MG tablet Commonly known as: AMARYL    Klor-Con  M20 20 MEQ tablet Generic drug: potassium chloride  SA   OLANZapine 5 MG tablet Commonly known as: ZYPREXA   simvastatin  20 MG tablet Commonly known as: ZOCOR        TAKE these medications    aspirin  EC 81 MG tablet Take 1 tablet (81 mg total) by mouth daily.   atorvastatin  10 MG tablet Commonly known as: LIPITOR Take 10 mg by mouth daily.   divalproex  500 MG DR tablet Commonly known as: DEPAKOTE  Take 500 mg by mouth 2 (two) times daily.   docusate 50 MG/5ML liquid Commonly known as: COLACE Take 10 mLs (100 mg total) by mouth 2 (two) times daily.   feeding supplement Liqd Take  237 mLs by mouth 2 (two) times daily between meals. Start taking on: October 18, 2024   insulin  glargine-yfgn 100 UNIT/ML injection Commonly known as: SEMGLEE Inject 0.16 mLs (16 Units total) into the skin at bedtime.   insulin  lispro 100 UNIT/ML injection Commonly known as: HUMALOG  Inject 4-8 Units into the skin 3 (three) times daily before meals. Sliding Scale: 250-300= 4 units 301-350= 6 units Over350=8 units   multivitamin with minerals Tabs tablet Take 1 tablet by mouth daily. Start taking on: October 18, 2024   polyethylene glycol 17 g packet Commonly known as: MIRALAX  / GLYCOLAX  Take 17 g by mouth daily. Start taking on: October 18, 2024   thiamine  100 MG tablet Commonly known as: Vitamin B-1 Take 1 tablet (100 mg total) by mouth daily for 14 days. Start taking on: October 18, 2024       Discharge Exam: Fredricka Weights   10/05/24 1938 10/10/24 0900  Weight: 100.7 kg 101.7 kg   Gen: NAD, A&Ox1 HEENT: NCAT, PERRLA,  Neck: Supple, no JVD, no LAD CV: Bradycardic, regular rhythm, no murmurs Resp: normal WOB, CTAB, no w/r/r Abd: Soft, NTND, no guarding, BS normoactive Ext: No LE edema, pulses 2+ b/l Skin: Warm, dry, no rashes/lesions Neuro: CN II-XII grossly intact,  strength 5/5 b/l, follows simple commands, very limited ability to answer questions   Condition at discharge: fair  The results of significant diagnostics from this hospitalization (including imaging, microbiology, ancillary and laboratory) are listed below for reference.   Imaging Studies: DG Abd Portable 1V Result Date: 10/12/2024 CLINICAL DATA:  Feeding tube placement. EXAM: PORTABLE ABDOMEN - 1 VIEW COMPARISON:  Earlier same day FINDINGS: 0948 hours. Feeding tube is been advanced in the interval with the tip now positioned in the distal stomach near the pylorus and directed distally towards the duodenum. Diffuse gaseous bowel distention is similar to prior without a frankly obstructive  appearance. IMPRESSION: Feeding tube tip is in the distal stomach near the pylorus and directed distally towards the duodenum. Electronically Signed   By: Camellia Candle M.D.   On: 10/12/2024 10:12   DG Abd Portable 1V Result Date: 10/12/2024 CLINICAL DATA:  Feeding tube placement. EXAM: PORTABLE ABDOMEN - 1 VIEW COMPARISON:  10/10/2024 FINDINGS: The tip of the feeding tube is in the gastric fundus. Diffuse gaseous bowel distension evident, nonspecific pattern. Telemetry leads overlie the chest. IMPRESSION: Feeding tube tip is in the gastric fundus. Electronically Signed   By: Camellia Candle M.D.   On: 10/12/2024 09:46   DG Abd Portable 1V Result Date: 10/10/2024 CLINICAL DATA:  OG tube placement EXAM: PORTABLE ABDOMEN - 1 VIEW COMPARISON:  09/04/2018, chest x-ray 10/07/2024 FINDINGS: Surgical clips in the right upper quadrant. Nonobstructed upper gas pattern. No enteric tube visualized below the diaphragm. Enteric tube and side port are visualized over the right lower chest and likely within the right lower lobe bronchi. IMPRESSION: Enteric tube and side port are visualized over the right lower chest and likely within the right lower lobe bronchi. Recommend removal and repositioning. Critical Value/emergent results were called by telephone at the time of interpretation on 10/10/2024 at 11:02 pm to provider Nate pt's ICU nurse , who verbally acknowledged these results. Results also discussed via telephone with Dr. Ogan at 11:11 PM Electronically Signed   By: Luke Bun M.D.   On: 10/10/2024 23:12   EEG adult Result Date: 10/09/2024 Shelton Arlin KIDD, MD     10/09/2024  5:36 AM Patient Name: DENI LEFEVER MRN: [id number] Epilepsy Attending: Arlin KIDD Shelton Referring Physician/Provider: Waddell Karna LABOR, NP Date: 10/08/2024 Duration: 22.06 mins Patient history: 73 y.o. male who initially presented to [facility] for AMS. EEG to evaluate for seizure Level of alertness: Awake AEDs during EEG study: None  Technical aspects: This EEG study was done with scalp electrodes positioned according to the 10-20 International system of electrode placement. Electrical activity was reviewed with band pass filter of 1-70Hz , sensitivity of 7 uV/mm, display speed of 19mm/sec with a 60Hz  notched filter applied as appropriate. EEG data were recorded continuously and digitally stored.  Video monitoring was available and reviewed as appropriate. Description: EEG showed continuous generalized 3 to 6 Hz theta-delta slowing. Hyperventilation and photic stimulation were not performed.   ABNORMALITY - Continuous slow, generalized IMPRESSION: This study is suggestive of generalized cerebral dysfunction (encephalopathy). No seizures or epileptiform discharges were seen throughout the recording. Arlin KIDD Shelton   MR BRAIN WO CONTRAST Result Date: 10/07/2024 EXAM: MRI BRAIN WITHOUT CONTRAST 10/07/2024 10:20:11 PM TECHNIQUE: Multiplanar multisequence MRI of the head/brain was performed without the administration of intravenous contrast. COMPARISON: CT Head 10/05/2024; MR Head 07/06/2019; MR Head 07/05/2019. CLINICAL HISTORY: Neuro deficit, acute, stroke suspected. FINDINGS: BRAIN AND VENTRICLES: No acute infarct. No acute intracranial hemorrhage. Small focus  of superficial siderosis along the left parietal/occipital convexity.s No mass. No midline shift. Cerebral atrophy with similar ventriculomegaly. Small (3 mm) extra-axial lesion along the right frontal convexity (series 11 image 37). Normal flow voids. ORBITS: No acute abnormality. SINUSES AND MASTOIDS: No acute abnormality. BONES AND SOFT TISSUES: Normal marrow signal. No acute soft tissue abnormality. IMPRESSION: 1. No acute intracranial abnormality. 2. Small (3 mm) extra-axial lesion along the right frontal convexity may represent a small meningioma without mass effect but is incompletely assessed without contrast. 3. Small focus of superficial siderosis along the left  parietal/occipital convexity. 4. Cerebral atrophy. Electronically signed by: Gilmore Molt MD 10/07/2024 10:42 PM EST RP Workstation: [id number]   DG CHEST PORT 1 VIEW Result Date: 10/07/2024 EXAM: 1 VIEW(S) XRAY OF THE CHEST 10/07/2024 07:21:00 PM COMPARISON: 10/06/2024 CLINICAL HISTORY: SOB (shortness of breath) FINDINGS: LINES, TUBES AND DEVICES: Endotracheal tube in place with tip 4.5 cm above the carina. Enteric tube in place with tip and side port overlying the expected region of the gastric lumen. LUNGS AND PLEURA: Left basilar atelectasis. Small left pleural effusion. No pneumothorax. HEART AND MEDIASTINUM: No acute abnormality of the cardiac and mediastinal silhouettes. BONES AND SOFT TISSUES: No acute osseous abnormality. IMPRESSION: 1. Left basilar atelectasis 2. Small left pleural effusion. Electronically signed by: Oneil Devonshire MD 10/07/2024 07:52 PM EST RP Workstation: [id number]   ECHOCARDIOGRAM COMPLETE Result Date: 10/06/2024    ECHOCARDIOGRAM REPORT   Patient Name:   SMITH POTENZA Date of Exam: 10/06/2024 Medical Rec #:  [id number]     Height:       74.0 in Accession #:    [id number]    Weight:       222.0 lb Date of Birth:  Dec 04, 1950     BSA:          2.272 m Patient Age:    73 years      BP:           124/57 mmHg Patient Gender: M             HR:           46 bpm. Exam Location:  Inpatient Procedure: 2D Echo, Cardiac Doppler and Color Doppler (Both Spectral and Color            Flow Doppler were utilized during procedure). Indications:    Shock R57.9  History:        Patient has prior history of Echocardiogram examinations, most                 recent 07/06/2019.  Sonographer:    Tinnie Gosling RDCS Referring Phys: [phone] BERNARDINO KATHEE COME IMPRESSIONS  1. Aorta not well seen Consider other imaging modality to define aortic size.  2. Poor acoustic windows limit study Endocardium not well seen.  3. Left ventricular ejection fraction, by estimation, is 55 to 60%. The left ventricle has normal function.  The left ventricle has no regional wall motion abnormalities. Left ventricular diastolic parameters are consistent with Grade I diastolic dysfunction (impaired relaxation).  4. Right ventricular systolic function is mildly reduced. The right ventricular size is normal.  5. The mitral valve is normal in structure. Trivial mitral valve regurgitation.  6. The aortic valve is tricuspid. Aortic valve regurgitation is not visualized. Aortic valve sclerosis/calcification is present, without any evidence of aortic stenosis.  7. Aortic dilatation noted. There is mild dilatation of the ascending aorta, measuring 42 mm.  8. The inferior vena cava is normal in size  with <50% respiratory variability, suggesting right atrial pressure of 8 mmHg. Comparison(s): The left ventricular function is unchanged. FINDINGS  Left Ventricle: Left ventricular ejection fraction, by estimation, is 55 to 60%. The left ventricle has normal function. The left ventricle has no regional wall motion abnormalities. Left ventricular diastolic parameters are consistent with Grade I diastolic dysfunction (impaired relaxation). Right Ventricle: The right ventricular size is normal. Right vetricular wall thickness was not assessed. Right ventricular systolic function is mildly reduced. Left Atrium: Left atrial size was normal in size. Right Atrium: Right atrial size was normal in size. Pericardium: There is no evidence of pericardial effusion. Mitral Valve: The mitral valve is normal in structure. Trivial mitral valve regurgitation. Tricuspid Valve: The tricuspid valve is normal in structure. Tricuspid valve regurgitation is trivial. Aortic Valve: The aortic valve is tricuspid. Aortic valve regurgitation is not visualized. Aortic valve sclerosis/calcification is present, without any evidence of aortic stenosis. Pulmonic Valve: The pulmonic valve was not well visualized. Pulmonic valve regurgitation is not visualized. No evidence of pulmonic stenosis. Aorta:  The aortic root is normal in size and structure and aortic dilatation noted. There is mild dilatation of the ascending aorta, measuring 42 mm. Venous: The inferior vena cava is normal in size with less than 50% respiratory variability, suggesting right atrial pressure of 8 mmHg. IAS/Shunts: No atrial level shunt detected by color flow Doppler.  LEFT VENTRICLE PLAX 2D LVOT diam:     2.20 cm   Diastology LV SV:         35        LV e' medial:    4.57 cm/s LV SV Index:   16        LV E/e' medial:  6.9 LVOT Area:     3.80 cm  LV e' lateral:   6.31 cm/s                          LV E/e' lateral: 5.0  RIGHT VENTRICLE            IVC RV S prime:     8.49 cm/s  IVC diam: 2.00 cm TAPSE (M-mode): 1.6 cm LEFT ATRIUM           Index LA Vol (A4C): 23.5 ml 10.34 ml/m  AORTIC VALVE LVOT Vmax:   39.60 cm/s LVOT Vmean:  30.200 cm/s LVOT VTI:    0.093 m  AORTA Ao Root diam: 3.50 cm Ao Asc diam:  4.20 cm MITRAL VALVE MV Area (PHT): 1.35 cm    SHUNTS MV Decel Time: 563 msec    Systemic VTI:  0.09 m MV E velocity: 31.60 cm/s  Systemic Diam: 2.20 cm MV A velocity: 40.90 cm/s MV E/A ratio:  0.77 Vina Gull MD Electronically signed by Vina Gull MD Signature Date/Time: 10/06/2024/4:07:33 PM    Final    CT ABDOMEN PELVIS W CONTRAST Result Date: 10/06/2024 EXAM: CT ABDOMEN AND PELVIS WITH CONTRAST 10/06/2024 12:24:36 PM TECHNIQUE: CT of the abdomen and pelvis was performed with the administration of intravenous contrast. Multiplanar reformatted images are provided for review. Automated exposure control, iterative reconstruction, and/or weight-based adjustment of the mA/kV was utilized to reduce the radiation dose to as low as reasonably achievable. COMPARISON: 03/21/2018 CLINICAL HISTORY: Sepsis. FINDINGS: LOWER CHEST: Trace left pleural effusion. Bilateral lower lobe subpleural consolidation, left greater than right. Linear density within the right middle lobe likely reflects mucoid impaction within a dilated bronchus. LIVER: Multiple  calcifications are again noted  along the inferior margin of the right lobe of liver, unchanged from prior study. GALLBLADDER AND BILE DUCTS: Status post cholecystectomy. No biliary ductal dilatation. SPLEEN: The spleen is within normal limits in size and appearance. PANCREAS: Changes due to chronic pancreatitis noted including diffuse main duct dilatation parenchymal calcifications and atrophy. These findings are new when compared with 2019. ADRENAL GLANDS: Normal size and morphology bilaterally. No nodule, thickening, or hemorrhage. No periadrenal stranding. KIDNEYS, URETERS AND BLADDER: Bilateral renal cortical thinning. Striated nephrographic appearance of both kidneys identified, concerning for acute pyelonephritis. No nephrolithiasis or signs of obstructive uropathy. No perinephric or periureteral stranding. The decompressed urinary bladder appears thick-walled with surrounding soft tissue haziness. GI AND BOWEL: There is an enteric tube in place, which is looped in the gastric fundus. The appendix is visualized and normal in caliber, without wall thickening, periappendiceal inflammation, or fluid. Moderate volume of desiccated stool distends the rectum. Rectal tube in place. There is no bowel obstruction. PERITONEUM AND RETROPERITONEUM: Trace fluid within the posterior pelvis along the presacral region. No discrete fluid collections. No signs of pneumoperitoneum. VASCULATURE: Aorta is normal in caliber. LYMPH NODES: No lymphadenopathy. REPRODUCTIVE ORGANS: The prostate gland is either surgically absent or atrophic. BONES AND SOFT TISSUES: Schmorl node deformity is noted along the inferior endplate of the L4 vertebral body. Rectal temperature probe is identified. No acute osseous abnormality. No focal soft tissue abnormality. IMPRESSION: 1. Findings compatible with acute pyelonephritis in both kidneys, without nephrolithiasis or obstructive uropathy. 2. Decompressed urinary bladder with wall thickening and  surrounding soft tissue haziness. The wall thickening and haziness may reflect underlying cystitis. 3. Bilateral lower lobe subpleural consolidation, left greater than right, which may reflect atelectasis or early pneumonia. 4. Changes of chronic pancreatitis with diffuse main duct dilatation, parenchymal calcifications, and atrophy, new compared to 2019. Electronically signed by: Waddell Calk MD 10/06/2024 12:50 PM EST RP Workstation: [id number]   DG CHEST PORT 1 VIEW Result Date: 10/06/2024 EXAM: 1 VIEW(S) XRAY OF THE CHEST 10/06/2024 12:08:00 PM COMPARISON: 10/05/2024 CLINICAL HISTORY: Encounter for imaging study to confirm orogastric (OG) tube placement FINDINGS: LINES, TUBES AND DEVICES: Endotracheal tube in place with tip 4.7 cm above carina. Enteric tube coiled within stomach. LUNGS AND PLEURA: New atelectasis versus consolidation noted within the left base. No pulmonary edema. No pleural effusion. No pneumothorax. HEART AND MEDIASTINUM: No acute abnormality of the cardiac and mediastinal silhouettes. BONES AND SOFT TISSUES: No acute osseous abnormality. IMPRESSION: 1. New left basilar atelectasis versus consolidation. Electronically signed by: Waddell Calk MD 10/06/2024 12:26 PM EST RP Workstation: [id number]   DG Chest Portable 1 View Result Date: 10/05/2024 EXAM: 1 VIEW(S) XRAY OF THE CHEST 10/05/2024 08:43:17 PM COMPARISON: 07/30/2024 CLINICAL HISTORY: shob FINDINGS: LUNGS AND PLEURA: No focal pulmonary opacity. No pleural effusion. No pneumothorax. HEART AND MEDIASTINUM: No acute abnormality of the cardiac and mediastinal silhouettes. BONES AND SOFT TISSUES: No acute osseous abnormality. IMPRESSION: 1. No acute cardiopulmonary process identified. Electronically signed by: Oneil Devonshire MD 10/05/2024 08:45 PM EST RP Workstation: [id number]   CT Head Wo Contrast Result Date: 10/05/2024 EXAM: CT HEAD WITHOUT CONTRAST 10/05/2024 08:24:09 PM TECHNIQUE: CT of the head was performed without the  administration of intravenous contrast. Automated exposure control, iterative reconstruction, and/or weight based adjustment of the mA/kV was utilized to reduce the radiation dose to as low as reasonably achievable. COMPARISON: 8 / 6 / 20 CLINICAL HISTORY: Mental status change, unknown cause. FINDINGS: BRAIN AND VENTRICLES: Age-related cerebral volume loss. Periventricular white matter changes, likely  sequela of chronic small vessel ischemic disease. Atherosclerotic calcifications in intracranial carotid and vertebral arteries. No acute hemorrhage. No evidence of acute infarct. No hydrocephalus. No extra-axial collection. No mass effect or midline shift. ORBITS: Bilateral lens replacements. No acute abnormality. SINUSES: No acute abnormality. SOFT TISSUES AND SKULL: No acute soft tissue abnormality. No skull fracture. IMPRESSION: 1. No acute intracranial abnormality. Electronically signed by: Norman Gatlin MD 10/05/2024 08:30 PM EST RP Workstation: [id number]    Microbiology: Results for orders placed or performed during the hospital encounter of 10/05/24  Urine Culture     Status: Abnormal   Collection Time: 10/05/24  8:41 PM   Specimen: Urine, Random  Result Value Ref Range Status   Specimen Description   Final    URINE, RANDOM Performed at [facility], [address], [place], [place] [address]    Special Requests   Final    NONE Reflexed from [phone] Performed at [facility], [address] [place], [place] [address]    Culture >=100,000 COLONIES/mL PROTEUS MIRABILIS (A)  Final   Report Status 10/08/2024 FINAL  Final   Organism ID, Bacteria PROTEUS MIRABILIS (A)  Final      Susceptibility   Proteus mirabilis - MIC*    AMPICILLIN <=2 SENSITIVE Sensitive     CEFAZOLIN  (URINE) Value in next row Sensitive      4 SENSITIVEThis is a modified FDA-approved test that has been validated and its performance characteristics determined by the reporting laboratory.  This laboratory is certified under  the Clinical Laboratory Improvement Amendments CLIA as qualified to perform high complexity clinical laboratory testing.    CEFEPIME Value in next row Sensitive      4 SENSITIVEThis is a modified FDA-approved test that has been validated and its performance characteristics determined by the reporting laboratory.  This laboratory is certified under the Clinical Laboratory Improvement Amendments CLIA as qualified to perform high complexity clinical laboratory testing.    ERTAPENEM Value in next row Sensitive      4 SENSITIVEThis is a modified FDA-approved test that has been validated and its performance characteristics determined by the reporting laboratory.  This laboratory is certified under the Clinical Laboratory Improvement Amendments CLIA as qualified to perform high complexity clinical laboratory testing.    CEFTRIAXONE  Value in next row Sensitive      4 SENSITIVEThis is a modified FDA-approved test that has been validated and its performance characteristics determined by the reporting laboratory.  This laboratory is certified under the Clinical Laboratory Improvement Amendments CLIA as qualified to perform high complexity clinical laboratory testing.    CIPROFLOXACIN  Value in next row Sensitive      4 SENSITIVEThis is a modified FDA-approved test that has been validated and its performance characteristics determined by the reporting laboratory.  This laboratory is certified under the Clinical Laboratory Improvement Amendments CLIA as qualified to perform high complexity clinical laboratory testing.    GENTAMICIN Value in next row Sensitive      4 SENSITIVEThis is a modified FDA-approved test that has been validated and its performance characteristics determined by the reporting laboratory.  This laboratory is certified under the Clinical Laboratory Improvement Amendments CLIA as qualified to perform high complexity clinical laboratory testing.    NITROFURANTOIN Value in next row Resistant      4  SENSITIVEThis is a modified FDA-approved test that has been validated and its performance characteristics determined by the reporting laboratory.  This laboratory is certified under the Clinical Laboratory Improvement Amendments CLIA as qualified to  perform high complexity clinical laboratory testing.    TRIMETH /SULFA  Value in next row Sensitive      4 SENSITIVEThis is a modified FDA-approved test that has been validated and its performance characteristics determined by the reporting laboratory.  This laboratory is certified under the Clinical Laboratory Improvement Amendments CLIA as qualified to perform high complexity clinical laboratory testing.    AMPICILLIN/SULBACTAM Value in next row Sensitive      4 SENSITIVEThis is a modified FDA-approved test that has been validated and its performance characteristics determined by the reporting laboratory.  This laboratory is certified under the Clinical Laboratory Improvement Amendments CLIA as qualified to perform high complexity clinical laboratory testing.    PIP/TAZO Value in next row Sensitive      <=4 SENSITIVEThis is a modified FDA-approved test that has been validated and its performance characteristics determined by the reporting laboratory.  This laboratory is certified under the Clinical Laboratory Improvement Amendments CLIA as qualified to perform high complexity clinical laboratory testing.    MEROPENEM Value in next row Sensitive      <=4 SENSITIVEThis is a modified FDA-approved test that has been validated and its performance characteristics determined by the reporting laboratory.  This laboratory is certified under the Clinical Laboratory Improvement Amendments CLIA as qualified to perform high complexity clinical laboratory testing.    * >=100,000 COLONIES/mL PROTEUS MIRABILIS  Culture, blood (routine x 2)     Status: None   Collection Time: 10/05/24  9:47 PM   Specimen: Left Antecubital; Blood  Result Value Ref Range Status   Specimen  Description LEFT ANTECUBITAL  Final   Special Requests   Final    BOTTLES DRAWN AEROBIC AND ANAEROBIC Blood Culture adequate volume   Culture   Final    NO GROWTH 6 DAYS Performed at [facility], [address], [place], [place] [address]    Report Status 10/11/2024 FINAL  Final  Culture, blood (routine x 2)     Status: None   Collection Time: 10/05/24  9:54 PM   Specimen: BLOOD LEFT HAND  Result Value Ref Range Status   Specimen Description BLOOD LEFT HAND  Final   Special Requests   Final    BOTTLES DRAWN AEROBIC ONLY Blood Culture adequate volume   Culture   Final    NO GROWTH 6 DAYS Performed at [facility], [address], [place], [place] [address]    Report Status 10/11/2024 FINAL  Final  MRSA Next Gen by PCR, Nasal     Status: None   Collection Time: 10/05/24 10:51 PM   Specimen: Nasal Mucosa; Nasal Swab  Result Value Ref Range Status   MRSA by PCR Next Gen NOT DETECTED NOT DETECTED Final    Comment: (NOTE) The GeneXpert MRSA Assay (FDA approved for NASAL specimens only), is one component of a comprehensive MRSA colonization surveillance program. It is not intended to diagnose MRSA infection nor to guide or monitor treatment for MRSA infections. Test performance is not FDA approved in patients less than 21 years old. Performed at [facility], [address], [place], [place] [address]     Labs: CBC: Recent Labs  Lab 10/11/24 0247  WBC 10.1  HGB 11.0*  HCT 32.7*  MCV 93.4  PLT 126*   Basic Metabolic Panel: Recent Labs  Lab 10/11/24 0247 10/12/24 0754  NA 143 142  K 4.0 3.8  CL 107 102  CO2 27 28  GLUCOSE 82 102*  BUN 23 20  CREATININE 1.32* 1.26*  CALCIUM   8.7* 8.6*  MG 2.3 2.1  PHOS 1.9* 3.1   Liver Function Tests: No results for input(s): AST, ALT, ALKPHOS, BILITOT, PROT, ALBUMIN in the last 168 hours. CBG: Recent Labs  Lab 10/16/24 2057 10/17/24 0404 10/17/24 0804 10/17/24 0845 10/17/24 1057  GLUCAP 118* 78 60* 86 171*     Discharge time spent: 40 minutes  Signed: Duffy Larch, MD [facility] 10/17/2024

## 2024-10-17 NOTE — Progress Notes (Signed)
   Palliative Medicine Inpatient Follow Up Note HPI: 73 year old man w/ hx of alcoholic dementia, DM2, HTN, HLD p/w sudden onset AMS. Workup revealed septic shock, pyuria, bilateral pyelo and presumed septic encephalopathy. CT head neg. Somnolent with high secretion burden so PCCM consulted - intubated, has not passed SBT's.    Palliative care has been asked to support GOC conversations.   Today's Discussion 10/17/2024  *Please note that this is a verbal dictation therefore any spelling or grammatical errors are due to the Dragon Medical One system interpretation.  Chart reviewed inclusive of vital signs, progress notes, laboratory results, and diagnostic images.   No staffing concerns identified this morning.  I stopped by to see Norleen this morning. He is resting comfortably in NAD. He arouses to verbal stimulation. He is aware of self only.   Plan to transition to [facility] today.  OP Palliative care will be arranged on discharge.  Questions and concerns addressed/Palliative Support Provided.   Objective Assessment: Vital Signs Vitals:   10/17/24 0407 10/17/24 0804  BP: (!) 140/76 121/78  Pulse: (!) 52 (!) 40  Resp: 17 18  Temp:  (!) 97.4 F (36.3 C)  SpO2: (!) 87% 97%    Intake/Output Summary (Last 24 hours) at 10/17/2024 1059 Last data filed at 10/17/2024 0406 Gross per 24 hour  Intake --  Output 1050 ml  Net -1050 ml   Last Weight  Most recent update: 10/10/2024  9:48 AM    Weight  101.7 kg (224 lb 3.3 oz)            Gen: Elderly African-American male critically ill in appearance HEENT: [place], dry mucous membranes CV: Regular rate and rhythm PULM: On RA, breathing is even and nonlabored ABD: soft, nontender EXT: No edema Neuro:  Awake and alert to self  SUMMARY OF RECOMMENDATIONS   DNAR/DNI  Continue current care   Plan to discharge to [facility] with OP Palliative  support ______________________________________________________________________________________ Rosaline Becton [place] Palliative Medicine Team Team Cell Phone: [phone] Please utilize secure chat with additional questions, if there is no response within 30 minutes please call the above phone number  Time: 26  Palliative Medicine Team providers are available by phone from 7am to 7pm daily and can be reached through the team cell phone.  Should this patient require assistance outside of these hours, please call the patient's attending physician.

## 2024-10-17 NOTE — Plan of Care (Signed)
 Problem: Education: Goal: Knowledge of General Education information will improve Description: Including pain rating scale, medication(s)/side effects and non-pharmacologic comfort measures 10/17/2024 0835 by German Adrien BRAVO, RN Outcome: Progressing 10/16/2024 2039 by German Adrien BRAVO, RN Outcome: Progressing   Problem: Health Behavior/Discharge Planning: Goal: Ability to manage health-related needs will improve 10/17/2024 0835 by German Adrien BRAVO, RN Outcome: Progressing 10/16/2024 2039 by German Adrien BRAVO, RN Outcome: Progressing   Problem: Clinical Measurements: Goal: Ability to maintain clinical measurements within normal limits will improve 10/17/2024 0835 by German Adrien BRAVO, RN Outcome: Progressing 10/16/2024 2039 by German Adrien BRAVO, RN Outcome: Progressing Goal: Will remain free from infection 10/17/2024 0835 by German Adrien BRAVO, RN Outcome: Progressing 10/16/2024 2039 by German Adrien BRAVO, RN Outcome: Progressing Goal: Diagnostic test results will improve 10/17/2024 0835 by German Adrien BRAVO, RN Outcome: Progressing 10/16/2024 2039 by German Adrien BRAVO, RN Outcome: Progressing Goal: Respiratory complications will improve 10/17/2024 0835 by German Adrien BRAVO, RN Outcome: Progressing 10/16/2024 2039 by German Adrien BRAVO, RN Outcome: Progressing Goal: Cardiovascular complication will be avoided 10/17/2024 0835 by German Adrien BRAVO, RN Outcome: Progressing 10/16/2024 2039 by German Adrien BRAVO, RN Outcome: Progressing   Problem: Activity: Goal: Risk for activity intolerance will decrease 10/17/2024 0835 by German Adrien BRAVO, RN Outcome: Progressing 10/16/2024 2039 by German Adrien BRAVO, RN Outcome: Progressing   Problem: Nutrition: Goal: Adequate nutrition will be maintained 10/17/2024 0835 by German Adrien BRAVO, RN Outcome: Progressing 10/16/2024 2039 by German Adrien BRAVO, RN Outcome: Progressing   Problem: Coping: Goal: Level of anxiety will decrease 10/17/2024 0835 by German Adrien BRAVO, RN Outcome: Progressing 10/16/2024 2039 by German Adrien BRAVO, RN Outcome: Progressing   Problem: Elimination: Goal: Will not experience complications related to bowel motility 10/17/2024 0835 by German Adrien BRAVO, RN Outcome: Progressing 10/16/2024 2039 by German Adrien BRAVO, RN Outcome: Progressing Goal: Will not experience complications related to urinary retention 10/17/2024 0835 by German Adrien BRAVO, RN Outcome: Progressing 10/16/2024 2039 by German Adrien BRAVO, RN Outcome: Progressing   Problem: Pain Managment: Goal: General experience of comfort will improve and/or be controlled 10/17/2024 0835 by German Adrien BRAVO, RN Outcome: Progressing 10/16/2024 2039 by German Adrien BRAVO, RN Outcome: Progressing   Problem: Safety: Goal: Ability to remain free from injury will improve 10/17/2024 0835 by German Adrien BRAVO, RN Outcome: Progressing 10/16/2024 2039 by German Adrien BRAVO, RN Outcome: Progressing   Problem: Skin Integrity: Goal: Risk for impaired skin integrity will decrease 10/17/2024 0835 by German Adrien BRAVO, RN Outcome: Progressing 10/16/2024 2039 by German Adrien BRAVO, RN Outcome: Progressing   Problem: Education: Goal: Ability to describe self-care measures that may prevent or decrease complications (Diabetes Survival Skills Education) will improve 10/17/2024 0835 by German Adrien BRAVO, RN Outcome: Progressing 10/16/2024 2039 by German Adrien BRAVO, RN Outcome: Progressing Goal: Individualized Educational Video(s) 10/17/2024 0835 by German Adrien BRAVO, RN Outcome: Progressing 10/16/2024 2039 by German Adrien BRAVO, RN Outcome: Progressing   Problem: Coping: Goal: Ability to adjust to condition or change in health will improve 10/17/2024 0835 by German Adrien BRAVO, RN Outcome: Progressing 10/16/2024 2039 by German Adrien BRAVO, RN Outcome: Progressing   Problem: Fluid Volume: Goal: Ability to maintain a balanced intake and output will improve 10/17/2024 0835 by German Adrien BRAVO,  RN Outcome: Progressing 10/16/2024 2039 by German Adrien BRAVO, RN Outcome: Progressing   Problem: Health Behavior/Discharge Planning: Goal: Ability to identify and utilize available resources and services will improve 10/17/2024 0835 by German Adrien BRAVO, RN Outcome: Progressing 10/16/2024 2039 by  German Adrien BRAVO, RN Outcome: Progressing Goal: Ability to manage health-related needs will improve 10/17/2024 0835 by German Adrien BRAVO, RN Outcome: Progressing 10/16/2024 2039 by German Adrien BRAVO, RN Outcome: Progressing   Problem: Metabolic: Goal: Ability to maintain appropriate glucose levels will improve 10/17/2024 0835 by German Adrien BRAVO, RN Outcome: Progressing 10/16/2024 2039 by German Adrien BRAVO, RN Outcome: Progressing   Problem: Nutritional: Goal: Maintenance of adequate nutrition will improve 10/17/2024 0835 by German Adrien BRAVO, RN Outcome: Progressing 10/16/2024 2039 by German Adrien BRAVO, RN Outcome: Progressing Goal: Progress toward achieving an optimal weight will improve 10/17/2024 0835 by German Adrien BRAVO, RN Outcome: Progressing 10/16/2024 2039 by German Adrien BRAVO, RN Outcome: Progressing   Problem: Skin Integrity: Goal: Risk for impaired skin integrity will decrease 10/17/2024 0835 by German Adrien BRAVO, RN Outcome: Progressing 10/16/2024 2039 by German Adrien BRAVO, RN Outcome: Progressing   Problem: Tissue Perfusion: Goal: Adequacy of tissue perfusion will improve 10/17/2024 0835 by German Adrien BRAVO, RN Outcome: Progressing 10/16/2024 2039 by German Adrien BRAVO, RN Outcome: Progressing   Problem: Activity: Goal: Ability to tolerate increased activity will improve 10/17/2024 0835 by German Adrien BRAVO, RN Outcome: Progressing 10/16/2024 2039 by German Adrien BRAVO, RN Outcome: Progressing   Problem: Respiratory: Goal: Ability to maintain a clear airway and adequate ventilation will improve 10/17/2024 0835 by German Adrien BRAVO, RN Outcome: Progressing 10/16/2024 2039 by German Adrien BRAVO, RN Outcome: Progressing   Problem: Role Relationship: Goal: Method of communication will improve 10/17/2024 0835 by German Adrien BRAVO, RN Outcome: Progressing 10/16/2024 2039 by German Adrien BRAVO, RN Outcome: Progressing   Problem: Safety: Goal: Non-violent Restraint(s) 10/17/2024 0835 by German Adrien BRAVO, RN Outcome: Progressing 10/16/2024 2039 by German Adrien BRAVO, RN Outcome: Progressing   Problem: Fluid Volume: Goal: Compliance with measures to maintain balanced fluid volume will improve 10/17/2024 0835 by German Adrien BRAVO, RN Outcome: Progressing 10/16/2024 2039 by German Adrien BRAVO, RN Outcome: Progressing   Problem: Nutritional: Goal: Ability to make healthy dietary choices will improve 10/17/2024 0835 by German Adrien BRAVO, RN Outcome: Progressing 10/16/2024 2039 by German Adrien BRAVO, RN Outcome: Progressing   Problem: Clinical Measurements: Goal: Complications related to the disease process, condition or treatment will be avoided or minimized 10/17/2024 0835 by German Adrien BRAVO, RN Outcome: Progressing 10/16/2024 2039 by German Adrien BRAVO, RN Outcome: Progressing   Problem: Urinary Elimination: Goal: Signs and symptoms of infection will decrease 10/17/2024 0835 by German Adrien BRAVO, RN Outcome: Progressing 10/16/2024 2039 by German Adrien BRAVO, RN Outcome: Progressing

## 2024-10-17 NOTE — Inpatient Diabetes Management (Signed)
 Inpatient Diabetes Program Recommendations  AACE/ADA: New Consensus Statement on Inpatient Glycemic Control  Target Ranges:  Prepandial:   less than 140 mg/dL      Peak postprandial:   less than 180 mg/dL (1-2 hours)      Critically ill patients:  140 - 180 mg/dL    Review of Glycemic Control  Latest Reference Range & Units 10/16/24 09:00 10/16/24 11:35 10/16/24 16:34 10/16/24 20:57 10/17/24 04:04 10/17/24 08:04 10/17/24 08:45  Glucose-Capillary 70 - 99 mg/dL 862 (H) 810 (H) 829 (H) 118 (H) 78 60 (L) 86   Diabetes history: DM2 Outpatient Diabetes medications: Basaglar 60 units QAM, Humalog  4-8 units TID with meals, Amaryl  2 mg QAM Current orders for Inpatient glycemic control: Semglee 18 units at bedtime, Novolog  0-15 units Q4H  No longer on osmolite, however, on Ensure plus high protein (19 grams of carbohydrates) bid between meals  Note: hypoglycemia this am. Poor PO intake yesterday with 10% documented. Post prandial glucose trends may increase due to supplement.  Inpatient Diabetes Program Recommendations:    -   Decrease Semglee 16 units  Thanks, Clotilda Bull RN, MSN, BC-ADM Inpatient Diabetes Coordinator Team Pager [phone] (8a-5p)

## 2024-10-17 NOTE — Plan of Care (Signed)
 Problem: Education: Goal: Knowledge of General Education information will improve Description: Including pain rating scale, medication(s)/side effects and non-pharmacologic comfort measures 10/17/2024 1553 by German Adrien BRAVO, RN Outcome: Completed/Met 10/17/2024 0835 by German Adrien BRAVO, RN Outcome: Progressing   Problem: Health Behavior/Discharge Planning: Goal: Ability to manage health-related needs will improve 10/17/2024 1553 by German Adrien BRAVO, RN Outcome: Completed/Met 10/17/2024 0835 by German Adrien BRAVO, RN Outcome: Progressing   Problem: Clinical Measurements: Goal: Ability to maintain clinical measurements within normal limits will improve 10/17/2024 1553 by German Adrien BRAVO, RN Outcome: Completed/Met 10/17/2024 0835 by German Adrien BRAVO, RN Outcome: Progressing Goal: Will remain free from infection 10/17/2024 1553 by German Adrien BRAVO, RN Outcome: Completed/Met 10/17/2024 0835 by German Adrien BRAVO, RN Outcome: Progressing Goal: Diagnostic test results will improve 10/17/2024 1553 by German Adrien BRAVO, RN Outcome: Completed/Met 10/17/2024 0835 by German Adrien BRAVO, RN Outcome: Progressing Goal: Respiratory complications will improve 10/17/2024 1553 by German Adrien BRAVO, RN Outcome: Completed/Met 10/17/2024 0835 by German Adrien BRAVO, RN Outcome: Progressing Goal: Cardiovascular complication will be avoided 10/17/2024 1553 by German Adrien BRAVO, RN Outcome: Completed/Met 10/17/2024 0835 by German Adrien BRAVO, RN Outcome: Progressing   Problem: Activity: Goal: Risk for activity intolerance will decrease 10/17/2024 1553 by German Adrien BRAVO, RN Outcome: Completed/Met 10/17/2024 0835 by German Adrien BRAVO, RN Outcome: Progressing   Problem: Nutrition: Goal: Adequate nutrition will be maintained 10/17/2024 1553 by German Adrien BRAVO, RN Outcome: Completed/Met 10/17/2024 0835 by German Adrien BRAVO, RN Outcome: Progressing   Problem: Coping: Goal: Level of anxiety will decrease 10/17/2024  1553 by German Adrien BRAVO, RN Outcome: Completed/Met 10/17/2024 0835 by German Adrien BRAVO, RN Outcome: Progressing   Problem: Elimination: Goal: Will not experience complications related to bowel motility 10/17/2024 1553 by German Adrien BRAVO, RN Outcome: Completed/Met 10/17/2024 0835 by German Adrien BRAVO, RN Outcome: Progressing Goal: Will not experience complications related to urinary retention 10/17/2024 1553 by German Adrien BRAVO, RN Outcome: Completed/Met 10/17/2024 0835 by German Adrien BRAVO, RN Outcome: Progressing   Problem: Pain Managment: Goal: General experience of comfort will improve and/or be controlled 10/17/2024 1553 by German Adrien BRAVO, RN Outcome: Completed/Met 10/17/2024 0835 by German Adrien BRAVO, RN Outcome: Progressing   Problem: Safety: Goal: Ability to remain free from injury will improve 10/17/2024 1553 by German Adrien BRAVO, RN Outcome: Completed/Met 10/17/2024 0835 by German Adrien BRAVO, RN Outcome: Progressing   Problem: Skin Integrity: Goal: Risk for impaired skin integrity will decrease 10/17/2024 1553 by German Adrien BRAVO, RN Outcome: Completed/Met 10/17/2024 0835 by German Adrien BRAVO, RN Outcome: Progressing   Problem: Education: Goal: Ability to describe self-care measures that may prevent or decrease complications (Diabetes Survival Skills Education) will improve 10/17/2024 1553 by German Adrien BRAVO, RN Outcome: Completed/Met 10/17/2024 0835 by German Adrien BRAVO, RN Outcome: Progressing Goal: Individualized Educational Video(s) 10/17/2024 1553 by German Adrien BRAVO, RN Outcome: Completed/Met 10/17/2024 0835 by German Adrien BRAVO, RN Outcome: Progressing   Problem: Coping: Goal: Ability to adjust to condition or change in health will improve 10/17/2024 1553 by German Adrien BRAVO, RN Outcome: Completed/Met 10/17/2024 0835 by German Adrien BRAVO, RN Outcome: Progressing   Problem: Fluid Volume: Goal: Ability to maintain a balanced intake and output will improve 10/17/2024  1553 by German Adrien BRAVO, RN Outcome: Completed/Met 10/17/2024 0835 by German Adrien BRAVO, RN Outcome: Progressing   Problem: Health Behavior/Discharge Planning: Goal: Ability to identify and utilize available resources and services will improve 10/17/2024 1553 by German Adrien BRAVO, RN Outcome: Completed/Met 10/17/2024 0835 by  German Adrien BRAVO, RN Outcome: Progressing Goal: Ability to manage health-related needs will improve 10/17/2024 1553 by German Adrien BRAVO, RN Outcome: Completed/Met 10/17/2024 0835 by German Adrien BRAVO, RN Outcome: Progressing   Problem: Metabolic: Goal: Ability to maintain appropriate glucose levels will improve 10/17/2024 1553 by German Adrien BRAVO, RN Outcome: Completed/Met 10/17/2024 0835 by German Adrien BRAVO, RN Outcome: Progressing   Problem: Nutritional: Goal: Maintenance of adequate nutrition will improve 10/17/2024 1553 by German Adrien BRAVO, RN Outcome: Completed/Met 10/17/2024 0835 by German Adrien BRAVO, RN Outcome: Progressing Goal: Progress toward achieving an optimal weight will improve 10/17/2024 1553 by German Adrien BRAVO, RN Outcome: Completed/Met 10/17/2024 0835 by German Adrien BRAVO, RN Outcome: Progressing   Problem: Skin Integrity: Goal: Risk for impaired skin integrity will decrease 10/17/2024 1553 by German Adrien BRAVO, RN Outcome: Completed/Met 10/17/2024 0835 by German Adrien BRAVO, RN Outcome: Progressing   Problem: Tissue Perfusion: Goal: Adequacy of tissue perfusion will improve 10/17/2024 1553 by German Adrien BRAVO, RN Outcome: Completed/Met 10/17/2024 0835 by German Adrien BRAVO, RN Outcome: Progressing   Problem: Activity: Goal: Ability to tolerate increased activity will improve 10/17/2024 1553 by German Adrien BRAVO, RN Outcome: Completed/Met 10/17/2024 0835 by German Adrien BRAVO, RN Outcome: Progressing   Problem: Respiratory: Goal: Ability to maintain a clear airway and adequate ventilation will improve 10/17/2024 1553 by German Adrien BRAVO,  RN Outcome: Completed/Met 10/17/2024 0835 by German Adrien BRAVO, RN Outcome: Progressing   Problem: Role Relationship: Goal: Method of communication will improve 10/17/2024 1553 by German Adrien BRAVO, RN Outcome: Completed/Met 10/17/2024 0835 by German Adrien BRAVO, RN Outcome: Progressing   Problem: Safety: Goal: Non-violent Restraint(s) 10/17/2024 1553 by German Adrien BRAVO, RN Outcome: Completed/Met 10/17/2024 0835 by German Adrien BRAVO, RN Outcome: Progressing   Problem: Fluid Volume: Goal: Compliance with measures to maintain balanced fluid volume will improve 10/17/2024 1553 by German Adrien BRAVO, RN Outcome: Completed/Met 10/17/2024 0835 by German Adrien BRAVO, RN Outcome: Progressing   Problem: Nutritional: Goal: Ability to make healthy dietary choices will improve 10/17/2024 1553 by German Adrien BRAVO, RN Outcome: Completed/Met 10/17/2024 0835 by German Adrien BRAVO, RN Outcome: Progressing   Problem: Clinical Measurements: Goal: Complications related to the disease process, condition or treatment will be avoided or minimized 10/17/2024 1553 by German Adrien BRAVO, RN Outcome: Completed/Met 10/17/2024 0835 by German Adrien BRAVO, RN Outcome: Progressing   Problem: Urinary Elimination: Goal: Signs and symptoms of infection will decrease 10/17/2024 1553 by German Adrien BRAVO, RN Outcome: Completed/Met 10/17/2024 0835 by German Adrien BRAVO, RN Outcome: Progressing

## 2024-10-17 NOTE — Progress Notes (Signed)
 Report given to Merlynn, LPN, at [facility] [phone]).  All questions answered.  No concerns voiced.  Confirmed that patient would not be coming with any narcotic orders.

## 2024-10-17 NOTE — TOC Transition Note (Signed)
 Transition of Care [facility]) - Discharge Note   Patient Details  Name: Jeffery [name] MRN: [id number] Date of Birth: 10/05/1951  Transition of Care [facility]) CM/SW Contact:  Bridget Cordella Simmonds, LCSW Phone Number: 10/17/2024, 3:23 PM   Clinical Narrative:   Pt discharging to [facility].  RN call report to [phone].  PTAR called at 1520.     Final next level of care: Skilled Nursing Facility Barriers to Discharge: Barriers Resolved   Patient Goals and CMS Choice Patient states their goals for this hospitalization and ongoing recovery are:: SNF CMS Medicare.gov Compare Post Acute Care list provided to:: Patient Represenative (must comment) Choice offered to / list presented to : Spouse, Adult Children      Discharge Placement              Patient chooses bed at:  [facility]) Patient to be transferred to facility by: ptar Name of family member notified: wife joyce Patient and family notified of of transfer: 10/17/24  Discharge Plan and Services Additional resources added to the After Visit Summary for   In-house Referral: Clinical Social Work   Post Acute Care Choice: Skilled Nursing Facility                               Social Drivers of Health (SDOH) Interventions SDOH Screenings   Food Insecurity: Patient Unable To Answer (10/06/2024)  Housing: Patient Unable To Answer (10/06/2024)  Transportation Needs: Patient Unable To Answer (10/06/2024)  Utilities: Patient Unable To Answer (10/06/2024)  Depression (PHQ2-9): Low Risk  (02/06/2021)  Social Connections: Patient Unable To Answer (10/06/2024)  Tobacco Use: Medium Risk (10/05/2024)     Readmission Risk Interventions    10/07/2024   10:01 AM 10/06/2024    8:24 AM  Readmission Risk Prevention Plan  Transportation Screening Complete Complete  HRI or Home Care Consult Complete Complete  Social Work Consult for Recovery Care Planning/Counseling Complete Complete  Palliative Care Screening Not Applicable  Not Applicable  Medication Review Oceanographer) Complete Complete

## 2024-10-17 NOTE — TOC Progression Note (Addendum)
 Transition of Care [facility]) - Progression Note    Patient Details  Name: Jeffery [name] MRN: [id number] Date of Birth: May 13, 1951  Transition of Care [facility]) CM/SW Contact  Bridget Cordella Simmonds, LCSW Phone Number: 10/17/2024, 9:53 AM  Clinical Narrative:  SNF auth approved in [place]: [id number], 3 days: 11/19-11/21.  CSW confirmed with [facility]: they can receive pt today.  MD notified.    Per palliative, family requesting outpt palliative services. CSW spoke with daughter Gloria, agency choice discussed, she would like [facility] in [place].  Referral made to [facility].   Expected Discharge Plan: Skilled Nursing Facility Barriers to Discharge: English As A Second Language Teacher, Continued Medical Work up, SNF Pending bed offer               Expected Discharge Plan and Services In-house Referral: Clinical Social Work   Post Acute Care Choice: Skilled Nursing Facility Living arrangements for the past 2 months: Assisted Living Facility (Adult Care Home)                                       Social Drivers of Health (SDOH) Interventions SDOH Screenings   Food Insecurity: Patient Unable To Answer (10/06/2024)  Housing: Patient Unable To Answer (10/06/2024)  Transportation Needs: Patient Unable To Answer (10/06/2024)  Utilities: Patient Unable To Answer (10/06/2024)  Depression (PHQ2-9): Low Risk  (02/06/2021)  Social Connections: Patient Unable To Answer (10/06/2024)  Tobacco Use: Medium Risk (10/05/2024)    Readmission Risk Interventions    10/07/2024   10:01 AM 10/06/2024    8:24 AM  Readmission Risk Prevention Plan  Transportation Screening Complete Complete  HRI or Home Care Consult Complete Complete  Social Work Consult for Recovery Care Planning/Counseling Complete Complete  Palliative Care Screening Not Applicable Not Applicable  Medication Review Oceanographer) Complete Complete

## 2024-12-07 ENCOUNTER — Other Ambulatory Visit: Payer: Self-pay

## 2024-12-07 ENCOUNTER — Emergency Department (HOSPITAL_COMMUNITY)
Admission: EM | Admit: 2024-12-07 | Discharge: 2024-12-08 | Disposition: A | Discharge status: Skilled Nursing Facility | Attending: Emergency Medicine | Admitting: Emergency Medicine

## 2024-12-07 DIAGNOSIS — N189 Chronic kidney disease, unspecified: Secondary | ICD-10-CM | POA: Insufficient documentation

## 2024-12-07 DIAGNOSIS — I129 Hypertensive chronic kidney disease with stage 1 through stage 4 chronic kidney disease, or unspecified chronic kidney disease: Secondary | ICD-10-CM | POA: Diagnosis not present

## 2024-12-07 DIAGNOSIS — N3091 Cystitis, unspecified with hematuria: Secondary | ICD-10-CM

## 2024-12-07 DIAGNOSIS — D72819 Decreased white blood cell count, unspecified: Secondary | ICD-10-CM | POA: Insufficient documentation

## 2024-12-07 DIAGNOSIS — Z7982 Long term (current) use of aspirin: Secondary | ICD-10-CM | POA: Diagnosis not present

## 2024-12-07 DIAGNOSIS — F0390 Unspecified dementia without behavioral disturbance: Secondary | ICD-10-CM | POA: Insufficient documentation

## 2024-12-07 DIAGNOSIS — R319 Hematuria, unspecified: Secondary | ICD-10-CM | POA: Insufficient documentation

## 2024-12-07 DIAGNOSIS — E119 Type 2 diabetes mellitus without complications: Secondary | ICD-10-CM | POA: Diagnosis not present

## 2024-12-07 DIAGNOSIS — Z794 Long term (current) use of insulin: Secondary | ICD-10-CM | POA: Insufficient documentation

## 2024-12-07 NOTE — ED Provider Notes (Signed)
 " [place] EMERGENCY DEPARTMENT AT [facility] Provider Note   CSN: [id number] Arrival date & time: 12/07/24  2053     Patient presents with: Hematuria   Jeffery [name] is a 73 y.o. male.  {Add pertinent medical, surgical, social history, OB history to HPI:32947}  Hematuria   Patient has a history of dementia glaucoma hyperlipidemia hypertension diabetes kidney disease.  Patient is a resident of a nursing facility.  He was sent to the ED because staff noted he began having blood in his urine.  Patient is incontinent and wears a diaper.  Patient is not able to answer any questions for me.  This is his baseline per staff    Prior to Admission medications  Medication Sig Start Date End Date Taking? Authorizing Provider  aspirin  EC 81 MG EC tablet Take 1 tablet (81 mg total) by mouth daily. 07/09/19   Ricky Fines, MD  atorvastatin  (LIPITOR) 10 MG tablet Take 10 mg by mouth daily. 09/21/24   [provider]  divalproex  (DEPAKOTE ) 500 MG DR tablet Take 500 mg by mouth 2 (two) times daily. 09/21/24   [provider]  docusate (COLACE) 50 MG/5ML liquid Take 10 mLs (100 mg total) by mouth 2 (two) times daily. 10/17/24   Al-Sultani, Anmar, MD  feeding supplement (ENSURE PLUS HIGH PROTEIN) LIQD Take 237 mLs by mouth 2 (two) times daily between meals. 10/18/24   Al-Sultani, Anmar, MD  insulin  glargine-yfgn (SEMGLEE ) 100 UNIT/ML injection Inject 0.16 mLs (16 Units total) into the skin at bedtime. 10/17/24   Al-Sultani, Anmar, MD  insulin  lispro (HUMALOG ) 100 UNIT/ML injection Inject 4-8 Units into the skin 3 (three) times daily before meals. Sliding Scale: 250-300= 4 units 301-350= 6 units Over350=8 units    [provider]  Multiple Vitamin (MULTIVITAMIN WITH MINERALS) TABS tablet Take 1 tablet by mouth daily. 10/18/24   Al-Sultani, Anmar, MD  polyethylene glycol (MIRALAX  / GLYCOLAX ) 17 g packet Take 17 g by mouth daily. 10/18/24   Al-Sultani, Anmar, MD     Allergies: Penicillins, Amlodipine , Atenolol, Clonidine and derivatives, Metformin  and related, and Tape    Review of Systems  Genitourinary:  Positive for hematuria.    Updated Vital Signs BP 136/78 (BP Location: Right Arm)   Pulse 65   Temp 97.8 F (36.6 C) (Oral)   Resp 20   SpO2 100%   Physical Exam Vitals and nursing note reviewed.  Constitutional:      Appearance: He is well-developed. He is not diaphoretic.  HENT:     Head: Normocephalic and atraumatic.     Right Ear: External ear normal.     Left Ear: External ear normal.  Eyes:     General: No scleral icterus.       Right eye: No discharge.        Left eye: No discharge.     Conjunctiva/sclera: Conjunctivae normal.  Neck:     Trachea: No tracheal deviation.  Cardiovascular:     Rate and Rhythm: Normal rate and regular rhythm.  Pulmonary:     Effort: Pulmonary effort is normal. No respiratory distress.     Breath sounds: Normal breath sounds. No stridor. No wheezing or rales.  Abdominal:     General: Bowel sounds are normal.     Palpations: Abdomen is soft.     Tenderness: There is no abdominal tenderness. There is no guarding or rebound.     Comments: Possible distended bladder on exam  Musculoskeletal:  General: No tenderness or deformity.     Cervical back: Neck supple.  Skin:    General: Skin is warm and dry.     Findings: No rash.  Neurological:     General: No focal deficit present.     Mental Status: He is alert.     Cranial Nerves: No cranial nerve deficit, dysarthria or facial asymmetry.     Sensory: No sensory deficit.     Motor: No abnormal muscle tone or seizure activity.     Coordination: Coordination normal.     Comments: Eyes are open,  pt is awake,  does not answer questions  Psychiatric:        Mood and Affect: Mood normal.     (all labs ordered are listed, but only abnormal results are displayed) Labs Reviewed  CBC  BASIC METABOLIC PANEL WITH GFR  URINALYSIS, W/  REFLEX TO CULTURE (INFECTION SUSPECTED)    EKG: None  Radiology: No results found.  {Document cardiac monitor, telemetry assessment procedure when appropriate:32947} Procedures   Medications Ordered in the ED - No data to display    {Click here for ABCD2, HEART and other calculators REFRESH Note before signing:1}                              Medical Decision Making Amount and/or Complexity of Data Reviewed Labs: ordered.   ***  {Document critical care time when appropriate  Document review of labs and clinical decision tools ie CHADS2VASC2, etc  Document your independent review of radiology images and any outside records  Document your discussion with family members, caretakers and with consultants  Document social determinants of health affecting pt's care  Document your decision making why or why not admission, treatments were needed:32947:::1}   Final diagnoses:  None    ED Discharge Orders     None        "

## 2024-12-07 NOTE — ED Triage Notes (Signed)
 Patient brought in by [facility] form [employer]. [facility] staff states patient started having blood in his urine around 19:30. The provider for [facility] was notified and wanted patient to be sent for evaluation. Patient is A&O x1 with a history of dementia.

## 2024-12-08 LAB — CBC
HCT: 39.7 % (ref 39.0–52.0)
Hemoglobin: 13.4 g/dL (ref 13.0–17.0)
MCH: 32.1 pg (ref 26.0–34.0)
MCHC: 33.8 g/dL (ref 30.0–36.0)
MCV: 95 fL (ref 80.0–100.0)
Platelets: 126 K/uL — ABNORMAL LOW (ref 150–400)
RBC: 4.18 MIL/uL — ABNORMAL LOW (ref 4.22–5.81)
RDW: 14.1 % (ref 11.5–15.5)
WBC: 6.9 K/uL (ref 4.0–10.5)
nRBC: 0 % (ref 0.0–0.2)

## 2024-12-08 LAB — BASIC METABOLIC PANEL WITH GFR
Anion gap: 10 (ref 5–15)
BUN: 20 mg/dL (ref 8–23)
CO2: 26 mmol/L (ref 22–32)
Calcium: 9.3 mg/dL (ref 8.9–10.3)
Chloride: 103 mmol/L (ref 98–111)
Creatinine, Ser: 1.17 mg/dL (ref 0.61–1.24)
GFR, Estimated: >60 mL/min
Glucose, Bld: 252 mg/dL — ABNORMAL HIGH (ref 70–99)
Potassium: 4.2 mmol/L (ref 3.5–5.1)
Sodium: 138 mmol/L (ref 135–145)

## 2024-12-08 LAB — URINALYSIS, W/ REFLEX TO CULTURE (INFECTION SUSPECTED)
Glucose, UA: 250 mg/dL — AB
Ketones, ur: NEGATIVE mg/dL
Nitrite: NEGATIVE
Protein, ur: >300 mg/dL — AB
Specific Gravity, Urine: 1.025 (ref 1.005–1.030)
WBC, UA: >50 WBC/hpf (ref 0–5)
pH: 6 (ref 5.0–8.0)

## 2024-12-08 MED ORDER — FOSFOMYCIN TROMETHAMINE 3 G PO PACK
3.0000 g | PACK | Freq: Once | ORAL | Status: AC
  Administered 2024-12-08: 3 g via ORAL
  Filled 2024-12-08: qty 3

## 2024-12-08 NOTE — ED Notes (Signed)
 Report called to Joann at [facility].

## 2024-12-08 NOTE — ED Notes (Signed)
 PTAR at bedside

## 2024-12-08 NOTE — Discharge Instructions (Signed)
 He has been given a one-time antibiotic.  He does not need a prescription for antibiotics. He should follow-up with urology next week

## 2024-12-08 NOTE — ED Provider Notes (Signed)
 I assumed care of patient at signout.  Plan was to follow-up on labs Electrolytes reveal hyperglycemia without anion gap, and normal renal function Complete blood count is at baseline Urinalysis reveals obvious hematuria and moderate leuks Urine culture pending He is not septic appearing.  Will give one-time dose of fosfomycin  He can follow-up with urology  On exam he is awake and alert.  He is nonverbal on my exam.  He has no abdominal tenderness or distention, no suprapubic tenderness Patient has had urine and stool output here Patient is wearing a diaper without any overlying complications Patient is safe for discharge back to facility   Midge Golas, MD 12/08/24 0045

## 2025-01-31 ENCOUNTER — Ambulatory Visit: Admitting: Neurology
